# Patient Record
Sex: Female | Born: 1975 | Race: White | Hispanic: No | Marital: Married | Smoking: Current every day smoker
Health system: Southern US, Community
[De-identification: ages and names within clinical notes are randomized; demographics above are authoritative.]

## PROBLEM LIST (undated history)

## (undated) DIAGNOSIS — D689 Coagulation defect, unspecified: Secondary | ICD-10-CM

## (undated) DIAGNOSIS — F319 Bipolar disorder, unspecified: Secondary | ICD-10-CM

## (undated) DIAGNOSIS — F32A Depression, unspecified: Secondary | ICD-10-CM

## (undated) DIAGNOSIS — F419 Anxiety disorder, unspecified: Secondary | ICD-10-CM

## (undated) DIAGNOSIS — B1920 Unspecified viral hepatitis C without hepatic coma: Secondary | ICD-10-CM

## (undated) DIAGNOSIS — E041 Nontoxic single thyroid nodule: Secondary | ICD-10-CM

## (undated) DIAGNOSIS — R9389 Abnormal findings on diagnostic imaging of other specified body structures: Secondary | ICD-10-CM

## (undated) HISTORY — DX: Coagulation defect, unspecified: D68.9

## (undated) HISTORY — DX: Nontoxic single thyroid nodule: E04.1

## (undated) HISTORY — DX: Depression, unspecified: F32.A

## (undated) HISTORY — DX: Abnormal findings on diagnostic imaging of other specified body structures: R93.89

## (undated) HISTORY — DX: Unspecified viral hepatitis C without hepatic coma: B19.20

---

## 2000-02-29 ENCOUNTER — Other Ambulatory Visit: Admission: RE | Admit: 2000-02-29 | Discharge: 2000-02-29 | Payer: Self-pay | Admitting: Obstetrics and Gynecology

## 2000-02-29 ENCOUNTER — Encounter (INDEPENDENT_AMBULATORY_CARE_PROVIDER_SITE_OTHER): Payer: Self-pay

## 2000-04-26 ENCOUNTER — Ambulatory Visit (HOSPITAL_COMMUNITY): Admission: RE | Admit: 2000-04-26 | Discharge: 2000-04-26 | Payer: Self-pay | Admitting: Obstetrics and Gynecology

## 2000-04-26 ENCOUNTER — Encounter (INDEPENDENT_AMBULATORY_CARE_PROVIDER_SITE_OTHER): Payer: Self-pay

## 2005-08-03 ENCOUNTER — Other Ambulatory Visit: Admission: RE | Admit: 2005-08-03 | Discharge: 2005-08-03 | Payer: Self-pay | Admitting: Obstetrics and Gynecology

## 2005-08-28 ENCOUNTER — Emergency Department (HOSPITAL_COMMUNITY): Admission: EM | Admit: 2005-08-28 | Discharge: 2005-08-28 | Payer: Self-pay | Admitting: Emergency Medicine

## 2005-10-11 ENCOUNTER — Ambulatory Visit: Payer: Self-pay | Admitting: Neonatology

## 2005-10-11 ENCOUNTER — Inpatient Hospital Stay (HOSPITAL_COMMUNITY): Admission: AD | Admit: 2005-10-11 | Discharge: 2005-10-15 | Payer: Self-pay | Admitting: Obstetrics and Gynecology

## 2005-10-12 ENCOUNTER — Encounter (INDEPENDENT_AMBULATORY_CARE_PROVIDER_SITE_OTHER): Payer: Self-pay | Admitting: *Deleted

## 2005-11-01 ENCOUNTER — Inpatient Hospital Stay (HOSPITAL_COMMUNITY): Admission: AD | Admit: 2005-11-01 | Discharge: 2005-11-04 | Payer: Self-pay | Admitting: Obstetrics and Gynecology

## 2005-11-12 ENCOUNTER — Inpatient Hospital Stay (HOSPITAL_COMMUNITY): Admission: AD | Admit: 2005-11-12 | Discharge: 2005-11-12 | Payer: Self-pay | Admitting: Obstetrics and Gynecology

## 2005-11-13 ENCOUNTER — Emergency Department (HOSPITAL_COMMUNITY): Admission: EM | Admit: 2005-11-13 | Discharge: 2005-11-13 | Payer: Self-pay | Admitting: Emergency Medicine

## 2005-11-16 ENCOUNTER — Ambulatory Visit: Payer: Self-pay | Admitting: Hospitalist

## 2005-11-22 ENCOUNTER — Encounter (HOSPITAL_COMMUNITY): Admission: RE | Admit: 2005-11-22 | Discharge: 2005-12-28 | Payer: Self-pay | Admitting: Hospitalist

## 2005-11-22 ENCOUNTER — Ambulatory Visit: Payer: Self-pay | Admitting: Internal Medicine

## 2005-11-28 ENCOUNTER — Other Ambulatory Visit: Admission: RE | Admit: 2005-11-28 | Discharge: 2005-11-28 | Payer: Self-pay | Admitting: Obstetrics and Gynecology

## 2005-11-28 ENCOUNTER — Ambulatory Visit: Payer: Self-pay | Admitting: Gastroenterology

## 2005-12-14 ENCOUNTER — Ambulatory Visit: Payer: Self-pay | Admitting: Hospitalist

## 2006-01-09 ENCOUNTER — Ambulatory Visit (HOSPITAL_COMMUNITY): Admission: RE | Admit: 2006-01-09 | Discharge: 2006-01-09 | Payer: Self-pay | Admitting: Gastroenterology

## 2006-01-10 ENCOUNTER — Ambulatory Visit: Payer: Self-pay | Admitting: Internal Medicine

## 2006-01-11 DIAGNOSIS — F319 Bipolar disorder, unspecified: Secondary | ICD-10-CM | POA: Insufficient documentation

## 2006-01-11 DIAGNOSIS — B1710 Acute hepatitis C without hepatic coma: Secondary | ICD-10-CM | POA: Insufficient documentation

## 2006-01-11 DIAGNOSIS — E0500 Thyrotoxicosis with diffuse goiter without thyrotoxic crisis or storm: Secondary | ICD-10-CM

## 2006-01-11 DIAGNOSIS — Z8659 Personal history of other mental and behavioral disorders: Secondary | ICD-10-CM

## 2006-01-11 HISTORY — DX: Thyrotoxicosis with diffuse goiter without thyrotoxic crisis or storm: E05.00

## 2006-02-09 ENCOUNTER — Encounter (INDEPENDENT_AMBULATORY_CARE_PROVIDER_SITE_OTHER): Payer: Self-pay | Admitting: *Deleted

## 2006-02-09 ENCOUNTER — Ambulatory Visit: Payer: Self-pay | Admitting: Internal Medicine

## 2006-02-09 LAB — CONVERTED CEMR LAB
Free T4: 0.83 ng/dL — ABNORMAL LOW (ref 0.89–1.80)
T3, Total: 141.2 ng/dL (ref 60.0–181.0)
TSH: <0.004 microintl units/mL — ABNORMAL LOW (ref 0.350–5.50)

## 2006-02-14 DIAGNOSIS — Z8639 Personal history of other endocrine, nutritional and metabolic disease: Secondary | ICD-10-CM | POA: Insufficient documentation

## 2006-02-14 DIAGNOSIS — I10 Essential (primary) hypertension: Secondary | ICD-10-CM | POA: Insufficient documentation

## 2006-02-14 DIAGNOSIS — E0590 Thyrotoxicosis, unspecified without thyrotoxic crisis or storm: Secondary | ICD-10-CM | POA: Insufficient documentation

## 2006-03-02 ENCOUNTER — Ambulatory Visit: Payer: Self-pay | Admitting: Gastroenterology

## 2006-04-03 ENCOUNTER — Emergency Department (HOSPITAL_COMMUNITY): Admission: EM | Admit: 2006-04-03 | Discharge: 2006-04-03 | Payer: Self-pay | Admitting: Emergency Medicine

## 2006-04-05 ENCOUNTER — Ambulatory Visit: Payer: Self-pay | Admitting: Internal Medicine

## 2006-04-06 ENCOUNTER — Encounter (INDEPENDENT_AMBULATORY_CARE_PROVIDER_SITE_OTHER): Payer: Self-pay | Admitting: *Deleted

## 2006-05-01 ENCOUNTER — Telehealth (INDEPENDENT_AMBULATORY_CARE_PROVIDER_SITE_OTHER): Payer: Self-pay | Admitting: *Deleted

## 2006-05-04 ENCOUNTER — Telehealth (INDEPENDENT_AMBULATORY_CARE_PROVIDER_SITE_OTHER): Payer: Self-pay | Admitting: Hospitalist

## 2006-05-10 ENCOUNTER — Encounter (INDEPENDENT_AMBULATORY_CARE_PROVIDER_SITE_OTHER): Payer: Self-pay | Admitting: *Deleted

## 2006-06-07 ENCOUNTER — Ambulatory Visit: Payer: Self-pay | Admitting: Gastroenterology

## 2006-06-20 ENCOUNTER — Telehealth (INDEPENDENT_AMBULATORY_CARE_PROVIDER_SITE_OTHER): Payer: Self-pay | Admitting: Pharmacy Technician

## 2006-07-13 ENCOUNTER — Ambulatory Visit (HOSPITAL_COMMUNITY): Admission: RE | Admit: 2006-07-13 | Discharge: 2006-07-13 | Payer: Self-pay | Admitting: Gastroenterology

## 2006-07-13 ENCOUNTER — Encounter (INDEPENDENT_AMBULATORY_CARE_PROVIDER_SITE_OTHER): Payer: Self-pay | Admitting: *Deleted

## 2006-09-06 ENCOUNTER — Ambulatory Visit: Payer: Self-pay | Admitting: Gastroenterology

## 2007-02-28 ENCOUNTER — Ambulatory Visit: Payer: Self-pay | Admitting: Gastroenterology

## 2007-04-04 ENCOUNTER — Ambulatory Visit: Payer: Self-pay | Admitting: Gastroenterology

## 2008-05-11 ENCOUNTER — Inpatient Hospital Stay (HOSPITAL_COMMUNITY): Admission: RE | Admit: 2008-05-11 | Discharge: 2008-05-13 | Payer: Self-pay | Admitting: Obstetrics & Gynecology

## 2008-05-11 ENCOUNTER — Encounter (INDEPENDENT_AMBULATORY_CARE_PROVIDER_SITE_OTHER): Payer: Self-pay | Admitting: Obstetrics & Gynecology

## 2010-05-08 ENCOUNTER — Encounter: Payer: Self-pay | Admitting: Gastroenterology

## 2010-08-01 LAB — CBC
HCT: 28.3 % — ABNORMAL LOW (ref 36.0–46.0)
HCT: 34.8 % — ABNORMAL LOW (ref 36.0–46.0)
Hemoglobin: 10.1 g/dL — ABNORMAL LOW (ref 12.0–15.0)
Hemoglobin: 12.1 g/dL (ref 12.0–15.0)
MCHC: 35 g/dL (ref 30.0–36.0)
MCHC: 35.6 g/dL (ref 30.0–36.0)
MCV: 98.5 fL (ref 78.0–100.0)
MCV: 99 fL (ref 78.0–100.0)
Platelets: 231 10*3/uL (ref 150–400)
RDW: 13.4 % (ref 11.5–15.5)
WBC: 11.7 10*3/uL — ABNORMAL HIGH (ref 4.0–10.5)
WBC: 11.9 10*3/uL — ABNORMAL HIGH (ref 4.0–10.5)

## 2010-08-30 NOTE — Op Note (Signed)
NAMEMarland Kitchen  [name], [name]             ACCOUNT NO.:  [id number]   MEDICAL RECORD NO.:  [id number]          PATIENT TYPE:  INP   LOCATION:  9105                          [facility]   PHYSICIAN:  Ilda Mori, M.D.   DATE OF BIRTH:  12/05/1975   DATE OF PROCEDURE:  DATE OF DISCHARGE:                               OPERATIVE REPORT   PREOPERATIVE DIAGNOSES:  Previous cesarean section, voluntary  sterilization, term pregnancy.   POSTOPERATIVE DIAGNOSES:  Previous cesarean section, voluntary  sterilization, term pregnancy.   PROCEDURE:  Repeat low transverse cesarean section, bilateral partial  salpingectomy for sterilization.   SURGEON:  Ilda Mori, MD   ASSISTANT:  Malva Limes, MD   ANESTHESIA:  Spinal.   BLOOD LOSS:  800 mL.   FINDINGS:  Female infant, Apgar 8 and 9, birth weight 9 pounds 1 ounce.  Clear amniotic fluid.  Normal-appearing tubes, ovaries, and uterus.   INDICATIONS:  This is a 35 year old, gravida 2, para 1, female who is at  49 weeks' gestation whose previous baby was delivered at 28 weeks by a  low vertical uterine incision.  The patient requests repeat cesarean  section and also requests permanent sterilization.  The alternative  forms of nonpermanent sterilization and the risk of failure of two per  thousand was discussed with the patient prior to proceeding.   PROCEDURE:  The patient was taken to the operating room and spinal  anesthesia was placed.  She was then placed in the supine position with  left lateral tilt.  The abdomen was prepped and draped in sterile  fashion, and the bladder was catheterized.  A low transverse incision  was made through the previous laparotomy scar and carried down to the  fascia which was then extended transversely.  The rectus sheath was then  dissected free from the underlying rectus muscle superiorly.  The rectus  midline was then entered and extended bluntly.  The peritoneum was  entered at this time as well.  A  lower segment was identified.  Incision  was made and carried down to the endometrial cavity.  The amniotic fluid  was clear.  The infant was delivered without difficulty.  Cord bloods  were obtained.  The placenta was delivered with traction on the cord,  and the endometrial cavity was then bluntly curettaged with a sponge.  The lower segment was then closed with single layer of running  interlocking #1 Vicryl suture.  Hemostasis was noted to be present.  The  fallopian tubes were identified bilaterally, grasped at the isthmic  ampullary junction, and a knuckle of tube was doubly ligated with plain  0 catgut suture.  Portions of both tubes were sent for pathological  confirmation.  The rectus muscle and peritoneum were then closed in the  midline with running 3-0 Vicryl suture, the  fascia was closed with running 0 Vicryl suture, the subcutaneous tissue,  which was approximately 4 cm in depth, was closed with interrupted 2-0  plain suture, and the skin was closed with staples.  The patient  tolerated the procedure well and left the operating room in good  condition.      Ilda Mori, M.D.  Electronically Signed     RK/MEDQ  D:  05/11/2008  T:  05/11/2008  Job:  [id number]

## 2010-08-30 NOTE — Discharge Summary (Signed)
NAMEGABRYEL, [name]             ACCOUNT NO.:  [id number]   MEDICAL RECORD NO.:  [id number]          PATIENT TYPE:  INP   LOCATION:  9105                          [facility]   PHYSICIAN:  Gerrit Friends. Aldona Bar, M.D.   DATE OF BIRTH:  11-Jul-1975   DATE OF ADMISSION:  05/11/2008  DATE OF DISCHARGE:  05/13/2008                               DISCHARGE SUMMARY   DISCHARGE DIAGNOSES:  1. Term pregnancy delivered 9 pounds 1 ounce female infant, Apgars 8      and 9.  2. Blood type B positive.  3. Previous cesarean section.  4. Desire for permanent elective sterilization.   PROCEDURES:  Repeat cesarean section and bilateral tubal sterilization  procedure.   SUMMARY:  This 35 year old gravida 2, para 1 was admitted at term for  repeat cesarean section and tubal sterilization.  She was taken to the  operating room by Dr. Arlyce Dice on January 25 at which time she underwent a  repeat cesarean section with delivery of a 9 pounds 1 ounce female  infant with good Apgars at that time as well had a permanent elective  sterilization procedure.  Her postpartum course was totally benign.  Her  discharge hemoglobin was 10.1 with a white count of 11,700 and a  platelet count of 195,000.  On the morning of January 27, she was  desirous of discharge.  She was ambulating well, tolerating a regular  diet well, voiding without difficulty, passing gas without difficulty.  Her pain medication was very adequate.  The wound was clean and dry.  Her incision was healing nicely, and she was discharged home.   DISCHARGE MEDICATIONS:  Tylox 1-2 every 4-6 hours as needed for severe  pain, Motrin 600 mg every 6 hours as needed for less severe pain, and  she will continue on her Lamictal 200 mg at bedtime, and Risperdal 1 mg  at bedtime, which she took during her antenatal course.  She will finish  up her vitamins and she will add Feosol capsules - 1 daily until her  postpartum visit in approximately 4 weeks' time.   She will return to the office in 48 hours to have her staples removed  and wound Steri-Stripped.   CONDITION ON DISCHARGE:  Improved.      Gerrit Friends. Aldona Bar, M.D.  Electronically Signed     RMW/MEDQ  D:  05/13/2008  T:  05/13/2008  Job:  [id number]

## 2010-09-02 NOTE — Consult Note (Signed)
NAMEMAZEY, MANTELL             ACCOUNT NO.:  [id number]   MEDICAL RECORD NO.:  [id number]          PATIENT TYPE:  INP   LOCATION:  9305                          [facility]   PHYSICIAN:  Rudy Jew. Ashley Royalty, M.D.DATE OF BIRTH:  1976-02-01   DATE OF CONSULTATION:  10/12/2005  DATE OF DISCHARGE:  10/15/2005                                   CONSULTATION   ADDENDUM TO OPERATIVE NOTE DATED 10/12/2005:  After I was notified by  medical records that the dictation ended abruptly in the middle of the  previous dictation of 10/12/2005.   Again, the dictation after the sentence, The infant was suctioned.  The  infant was then given immediately to the waiting pediatrics team.  Arterial  cord pH was obtained, as well as regular and cord blood.  The placenta and  membranes were then removed in their entirety.  The uterus was then  exteriorized.  The uterus was then closed in three layers.  The first was a  running locking layer of 0-Vicryl.  The second was an interrupted layer of  figure-of-eight sutures of 0-Vicryl.  The third was a serosal layer of PDS  placed as a baseball stitch.  Hemostasis was noted.  Uterus, tubes and  ovaries were inspected and found to be normal.  They were returned to the  abdominal cavity.  Copious irrigation was accomplished.  Hemostasis was  noted.  The peritoneum was then closed with 3-0 Vicryl in a running fashion.  The deep fascia was closed with 0-Vicryl in an interrupted fashion.  The  skin was closed with staples.  The patient tolerated the procedure extremely  well and was returned to the recovery room in good condition.      James A. Ashley Royalty, M.D.  Electronically Signed     JAM/MEDQ  D:  11/08/2005  T:  11/08/2005  Job:  [id number]

## 2010-09-02 NOTE — Discharge Summary (Signed)
[name], TAT             ACCOUNT NO.:  [id number]   MEDICAL RECORD NO.:  [id number]          PATIENT TYPE:  INP   LOCATION:  9305                          [facility]   PHYSICIAN:  Rudy Jew. Ashley Royalty, M.D.DATE OF BIRTH:  Aug 29, 1975   DATE OF ADMISSION:  10/11/2005  DATE OF DISCHARGE:  10/15/2005                                 DISCHARGE SUMMARY   DISCHARGE DIAGNOSES:  1.  Intrauterine pregnancy at 27 weeks, one day, delivered.  2.  Bipolar disorder.  3.  Severe preeclampsia.  4.  History of drug use during current pregnancy.  5.  Tobacco use.   SPECIAL PROCEDURES:  Primary low vertical caesarian section.   CONSULTATIONS:  1.  Dr. Jeanie Sewer -- Psychiatry.  2.  Dr. Alison Murray -- Neonatology.   DISCHARGE MEDICATIONS:  1.  Depakote 250 mg t.i.d.  2.  Wellbutrin SR 150 mg daily.  3.  Tandem 1 p.o. daily.  4.  Percocet 5/325 #40,  5.  Procardia XL 30 mg.   HISTORY AND PHYSICAL:  This is a 35 year old primigravida at 42 week one day  gestation.  Prenatal care complicated by the above mentioned risk factors.  The patient was noted to have a blood pressure of 150/90 at her first visit  to my office August 03, 2005.  She was started on AdoMet 500 mg p.o. b.i.d.  in late April for possible chronic hypertension.  24-hour urine was obtained  June 1 and revealed 1490 mg protein per 24 hours.   The patient presented to the office October 10, 2005 at which time the blood  pressure was noted to be 140/70.  She denied visual disturbances, headache,  epigastric or right upper quadrant pain.  PIH panel was obtained and the  SGOT was noted to be 50, with SGPT of 75.  Platelet count was 194,000.  Other values were essentially within normal limits.  DTRs were 0 to 1+.  The  cervix was closed.  PIH panel was repeated in turn at admissions and SGOT  was 48, SGPT 72, platelet count 189,000.  The patient was hence admitted  with a diagnosis of prior hypertension with possible superimposed  severe  preeclampsia.   HOSPITAL COURSE:  The patient was admitted to [facility].  Admission  laboratories were drawn.  Magnesium sulfate was initiated.  Betamethasone  was administered in anticipation of likely pre-term delivery in the near  future.  In view of the fact that the patient demonstrated significant  proteinuria with elevated blood pressures as well as elevated liver enzymes,  the diagnosis of severe preeclampsia was made and decision was made to  proceed with primary caesarian section.  The patient was taken to the  operating room on October 12, 2005 for same.  The primary low vertical  caesarian section was performed by Dr. Ashley Royalty with Dr. Sydnee Cabal  assisting.  The infant  was a 1 pound 13 oz female, Apgars 4 at one minute, 8 at five minutes into  neonatal intensive care unit.  Arterial cord pH was 7.21.   DICTATION ENDED HERE      James A.  Ashley Royalty, M.D.  Electronically Signed     JAM/MEDQ  D:  11/08/2005  T:  11/08/2005  Job:  [id number]

## 2010-09-02 NOTE — Discharge Summary (Signed)
[name], [name]             ACCOUNT NO.:  [id number]   MEDICAL RECORD NO.:  [id number]          PATIENT TYPE:  INP   LOCATION:  9372                          [facility]   PHYSICIAN:  Rudy Jew. Ashley Royalty, M.D.DATE OF BIRTH:  February 09, 1976   DATE OF ADMISSION:  10/11/2005  DATE OF DISCHARGE:  10/15/2005                                 DISCHARGE SUMMARY   This is to replace the first dictation.   DISCHARGE DIAGNOSES:  1.  Intrauterine pregnancy at 27 weeks 1day gestation.  2.  Bipolar disorder.  3.  Chronic hypertension with superimposed severe preeclampsia.  4.  History of drug use during late pregnancy.  5.  Tobacco use.  6.  Preterm birth living child.   OPERATION AND SPECIAL PROCEDURES:  Primary low vertical cesarean section.   CONSULTATIONS:  Dr. Bea Graff;  Dr. Ransom--neonatology.   DISCHARGE MEDICATIONS:  1.  Depakote 250 t.i.d.  2.  Wellbutrin SR 150 daily tandem, #30.  3.  Percocet 5/325 mg, #40.  4.  Procardia XL 30 mg.   HISTORY AND PHYSICAL:  This is a 35 year old primigravida at 58 weeks 1 day  gestation on admission with the aforementioned diagnoses.  The patient was  noted at her first visit on August 03, 2005, to have a blood pressure of  150/90.  She was started on Aldomet 500 mg p.o. b.i.d.  A 24-hour urine was  obtained on September 15, 2005, which revealed 1490 mg of protein for 24 hours.  The patient presented to the office on October 10, 2005, for routine  evaluation.  At the time blood pressure was noted to be 140/70.  She denied  any symptoms of severe preeclampsia at that time.  PIH panel was obtained  and SGOT was noted to be 50 and SGPT noted to be 75.  The patient was asked  to come in the hospital upon receipt of the laboratory results.   On examination initial blood pressure was 133/87.  DTRs were 0 to 1+.  The  cervix was noted to be closed, long, and thick.  Repeat SGOT  in the MAU  revealed a value of 48 with an SGPT of 72.   HOSPITAL COURSE:  The patient was admitted to [facility]  [place].  She was given magnesium sulfate.  She was also given  betamethasone in anticipation of preterm delivery in the near future.  In  view of the hypertension with proteinuria as well as the liver enzyme  elevation, it was felt in the patient's best interest to proceed with  delivery for diagnosis of superimposed preeclampsia--severe.  Hence, the  patient was seen in the operating room on November 11, 2005, and underwent  primary low vertical cesarean section by Dr. Sylvester Harder.  Dr. Loreli Dollar assisted.  The infant was a 1 pound 13 ounce female with Apgars of  4 at 1 minute and 8 at 5 minutes, and sent to the neonatal intensive care  unit.  Arterial cord pH was 7.21.  Postoperatively, the patient was  maintained on the intensive care unit on magnesium sulfate.  Dr.  Williford  from psychiatry was consulted in order to assist with her bipolar  management.  She was begun on Depakote and Wellbutrin.  She later was placed  on Procardia by Dr. Sydnee Cabal.  By October 15, 2005, the patient was felt to  have improved sufficiently such that she was a candidate for discharge home.  She was, hence, discharged home on that day afebrile and in satisfactory  condition.   DISPOSITION:  The patient is to return to [facility] within 24 hours to remainder of her staples out and also to check  her blood pressure.      James A. Ashley Royalty, M.D.  Electronically Signed     JAM/MEDQ  D:  11/08/2005  T:  11/08/2005  Job:  [id number]
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
## 2010-09-02 NOTE — Op Note (Signed)
[facility]  Patient:    [name], [name]                    MRN: [id number] Proc. Date: 04/26/00 Adm. 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  OPERATION:                    Cold knife conization.  SURGEON:                      Katherine Roan, M.D.  DESCRIPTION OF PROCEDURE:     The patient was placed in the lithotomy position, prepped and draped in the usual fashion.  The cervix was quite small.  Infiltrated with 1% Xylocaine with epinephrine.  Two lateral hemostatic sutures were placed in the cervix, and a very careful, small conization was obtained with a deep endocervical bud.  The cervix was then closed with two sutures of 0 Vicryl.  Hemostasis was secure.  [name] tolerated this procedure well and was sent to the recovery room in good condition. DD:  04/26/00 TD:  04/27/00 Job: [id number]
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
## 2010-09-02 NOTE — Op Note (Signed)
NAMEPRESLI, FANGUY             ACCOUNT NO.:  [id number]   MEDICAL RECORD NO.:  [id number]          PATIENT TYPE:  INP   LOCATION:  9156                          [facility]  [facility]   PHYSICIAN:  Rudy Jew. Ashley Royalty, M.D.DATE OF BIRTH:  02/07/1976   DATE OF PROCEDURE:  10/12/2005  DATE OF DISCHARGE:                                 OPERATIVE REPORT   PREOPERATIVE DIAGNOSES:  1.  Intrauterine pregnancy at 27 weeks 2 days gestation.  2.  Preeclampsia, severe.   POSTOPERATIVE DIAGNOSES:  1.  Intrauterine pregnancy at 27 weeks 2 days gestation.  2.  Preeclampsia, severe.   PROCEDURE:  Primary low vertical cesarean section.   SURGEON:  Rudy Jew. Ashley Royalty, M.D.   ASSISTANT:  Loreli Dollar, M.D.   ANESTHESIA:  Spinal.   FINDINGS:  A 1 pound 13 ounce female.  Apgars 4 at one minute, 8 at five  minutes.  Sent to the neonatal intensive care unit.  Arterial cord pH 7.21.   ESTIMATED BLOOD LOSS:  500 cc.   COMPLICATIONS:  None.   PACKS AND DRAINS:  Foley.   Sponge, needle and instrument count reported as correct times two.   PROCEDURE:  The patient was taken to the operating room and placed in the  sitting position.  After spinal anesthetic was administered, she was placed  in the dorsal supine position and prepped and draped in the usual manner for  abdominal surgery.  Foley catheter was placed.  A Pfannenstiel incision was  made and the subcutaneous tissues were sharply and bluntly dissected down to  the fascia.  The fascia was nicked with a knife and incised transversely  with Mayo scissors.  The underlying rectus muscles were separated from the  overlying fascia using sharp and blunt dissection.  The rectus muscles were  separated in the midline exposing the peritoneum, which was elevated with  hemostats and entered atraumatically with Metzenbaum scissors.  The incision  was extended longitudinally.  The uterus was identified and a bladder flap  created by incising the anterior  uterine serosa and sharply and bluntly  dissecting the bladder inferiorly.  It was held in place with the bladder  blade.  The uterus was surveyed and the width was deemed insufficient for  low transverse incision; hence, a low vertical incision was  performed.  The fluid was noted to be slightly blood stained.  There was no  frank meconium.  The infant was delivered from a footling breech  presentation in an atraumatic manner.  The infant was suctioned.   Dictation ended at this point.      James A. Ashley Royalty, M.D.  Electronically Signed     JAM/MEDQ  D:  10/12/2005  T:  10/12/2005  Job:  [id number]

## 2010-09-02 NOTE — Discharge Summary (Signed)
[name], [name]             ACCOUNT NO.:  [id number]   MEDICAL RECORD NO.:  [id number]          PATIENT TYPE:  INP   LOCATION:  9372                          [facility]   PHYSICIAN:  Gerald Leitz, MD          DATE OF BIRTH:  [birth year]   DATE OF ADMISSION:  11/01/2005  DATE OF DISCHARGE:                                 DISCHARGE SUMMARY   ADMISSION DIAGNOSES:  1. Elevated liver enzymes.  2. Suspected recurrent severe preeclampsia vs depakote induced hepatitis.   DISCHARGE DIAGNOSIS:  Depakote-induced hepatitis.   BRIEF HOSPITAL COURSE:  The patient was admitted on November 01, 2005 by Dr.  Ashley Royalty for elevated liver enzymes and suspected recurrent preeclampsia.  She presented to his office complaining of severe headache, was found to  have LFTs of SGPT of 440s and SGOT in the 200s.  The patient was admitted  and received magnesium sulfate for seizure prophylaxis.  She was also seen  by Dr. Jeanie Sewer for evaluation given her history of bipolar disorder.  She  had been placed on Depakote in June, at which time she had a cesarean  section and severe preeclampsia during that admission.  She was placed on  Depakote during that admission.  He evaluated her and suspected that the  liver enzymes were elevated secondary to Depakote-induced hepatitis.  He  discontinued the Depakote.  The liver enzymes have declined during her stay.  Upon discharge, her SGOT is 130, SGPT is 319.  On July 21, she denied any  headache, visual disturbances, or right upper quadrant pain.   DISCHARGE INSTRUCTIONS:  The patient was advised to seek medical attention  or return if she develops a severe headache, visual disturbances, or right  upper quadrant pain.   FOLLOWUP:  The patient will follow up with Dr. Ashley Royalty on July 23 or 24 for  reevaluation of her liver enzymes.  She is also to report to [facility]  on Wednesday for evaluation of her lithium level.   DISCHARGE MEDICATIONS:  1. Lithium 300  mg  1 p.o. t.i.d.  2. Wellbutrin as previously ordered by Dr. Jeanie Sewer.   DISPOSITION:  She is discharged home in stable condition.  Diet will be  regular.  Follow up with Dr. Ashley Royalty as stated above.  He will arrange  followup with [facility] for reevaluation and management of her  bipolar disorder.      Gerald Leitz, MD  Electronically Signed     TC/MEDQ  D:  11/04/2005  T:  11/04/2005  Job:  [id number]

## 2010-09-02 NOTE — H&P (Signed)
[name], [name]             ACCOUNT NO.:  [id number]   MEDICAL RECORD NO.:  [id number]          PATIENT TYPE:  INP   LOCATION:  9372                          [facility]   PHYSICIAN:  Rudy Jew. Ashley Royalty, M.D.DATE OF BIRTH:  Apr 01, 1976   DATE OF ADMISSION:  11/01/2005  DATE OF DISCHARGE:                                HISTORY & PHYSICAL   HISTORY OF PRESENT ILLNESS:  This is a 35 year old, G1, P1 who is status  post cesarean section on October 12, 2005, at 27-1/2 weeks' gestation for  severe preeclampsia.  The patient also has Bipolar disorder.  She is a  smoker.  During her most recent hospitalization, Dr. Jeanie Sewer from  psychiatry was consulted and the patient was started on Wellbutrin and  Depakote.  She was discharged home several days after delivery for close  followup.  The patient presented to the office on October 31, 2005,  approximately 19 days after delivery complaining of a headache.  She denies  any visual disturbances, right upper quadrant or epigastric pain.  Blood  pressure was 120/80.  PIH panel was obtained subsequently and the SGOT was  noted to be 250 units/L and the SGPT 450 3 units/L.  The patient is hence  admitted for evaluation and management including magnesium sulfate therapy.   MEDICATIONS:  1.  Depakote, per Dr. Jeanie Sewer.  2.  Wellbutrin, per Dr. Jeanie Sewer.  3.  Motrin 600 mg four times a day p.r.n. pain.   PAST MEDICAL HISTORY:  As above.   PAST SURGICAL HISTORY:  As above, plus unknown procedure for cervical  dysplasia in the year 2000.   REVIEW OF SYSTEMS:  Noncontributory.   PHYSICAL EXAMINATION:  GENERAL:  Well-developed, well-nourished, pleasant,  white female in no acute distress.  VITAL SIGNS:  Afebrile, vital signs stable.  CHEST:  Lungs are clear.  CARDIAC:  Regular rate and rhythm.  ABDOMEN:  Soft and nontender.  Incision is healing well.  MUSCULOSKELETAL:  No CVA tenderness.  No significant edema.  DTRs are 2+ and  equal.  PELVIC:  Deferred.   IMPRESSION:  1.  Status post primary cesarean section on October 12, 2005, at about 27-1/2      weeks' gestation for severe preeclampsia.  2.  Bipolar disorder.  3.  Smoker.  4.  Headache with liver enzyme elevation, apparently recurrence of      preeclampsia.   PLAN:  Admit.  Will administer IV magnesium sulfate.  Dr. Jeanie Sewer has been  consulted.  I discussed the patient's care with him briefly and he suggested  in the interim period that the Depakote be discontinued.  This has been  ordered.  Will repeat labs and follow patient closely.      James A. Ashley Royalty, M.D.  Electronically Signed     JAM/MEDQ  D:  11/02/2005  T:  11/02/2005  Job:  [id number]

## 2013-12-27 ENCOUNTER — Encounter (HOSPITAL_COMMUNITY): Payer: Self-pay | Admitting: Emergency Medicine

## 2013-12-27 ENCOUNTER — Emergency Department (HOSPITAL_COMMUNITY): Payer: BC Managed Care – PPO

## 2013-12-27 ENCOUNTER — Emergency Department (HOSPITAL_COMMUNITY)
Admission: EM | Admit: 2013-12-27 | Discharge: 2013-12-27 | Disposition: A | Discharge status: Home / Self Care | Payer: BC Managed Care – PPO | Attending: Emergency Medicine | Admitting: Emergency Medicine

## 2013-12-27 DIAGNOSIS — H5710 Ocular pain, unspecified eye: Secondary | ICD-10-CM | POA: Diagnosis present

## 2013-12-27 DIAGNOSIS — Z87891 Personal history of nicotine dependence: Secondary | ICD-10-CM | POA: Insufficient documentation

## 2013-12-27 DIAGNOSIS — H547 Unspecified visual loss: Secondary | ICD-10-CM | POA: Diagnosis not present

## 2013-12-27 DIAGNOSIS — H5340 Unspecified visual field defects: Secondary | ICD-10-CM

## 2013-12-27 LAB — CBC WITH DIFFERENTIAL/PLATELET
BASOS ABS: 0 10*3/uL (ref 0.0–0.1)
Basophils Relative: 0 % (ref 0–1)
EOS PCT: 1 % (ref 0–5)
Eosinophils Absolute: 0.1 10*3/uL (ref 0.0–0.7)
HEMATOCRIT: 39.8 % (ref 36.0–46.0)
HEMOGLOBIN: 14.5 g/dL (ref 12.0–15.0)
LYMPHS PCT: 28 % (ref 12–46)
Lymphs Abs: 2.1 10*3/uL (ref 0.7–4.0)
MCH: 31.8 pg (ref 26.0–34.0)
MCHC: 36.4 g/dL — AB (ref 30.0–36.0)
MCV: 87.3 fL (ref 78.0–100.0)
Monocytes Absolute: 0.6 10*3/uL (ref 0.1–1.0)
Monocytes Relative: 8 % (ref 3–12)
NEUTROS ABS: 4.8 10*3/uL (ref 1.7–7.7)
Neutrophils Relative %: 63 % (ref 43–77)
Platelets: 188 10*3/uL (ref 150–400)
RBC: 4.56 MIL/uL (ref 3.87–5.11)
RDW: 12.5 % (ref 11.5–15.5)
WBC: 7.7 10*3/uL (ref 4.0–10.5)

## 2013-12-27 LAB — BASIC METABOLIC PANEL
ANION GAP: 12 (ref 5–15)
BUN: 9 mg/dL (ref 6–23)
CHLORIDE: 105 meq/L (ref 96–112)
CO2: 22 meq/L (ref 19–32)
CREATININE: 0.89 mg/dL (ref 0.50–1.10)
Calcium: 10.4 mg/dL (ref 8.4–10.5)
GFR calc Af Amer: >90 mL/min (ref >90)
GFR, EST NON AFRICAN AMERICAN: 81 mL/min — AB (ref >90)
GLUCOSE: 86 mg/dL (ref 70–99)
Potassium: 4.2 mEq/L (ref 3.7–5.3)
SODIUM: 139 meq/L (ref 137–147)

## 2013-12-27 MED ORDER — GADOBENATE DIMEGLUMINE 529 MG/ML IV SOLN
20.0000 mL | Freq: Once | INTRAVENOUS | Status: AC | PRN
  Administered 2013-12-27: 20 mL via INTRAVENOUS

## 2013-12-27 NOTE — ED Notes (Signed)
Patient transported to MRI 

## 2013-12-27 NOTE — ED Provider Notes (Signed)
CSN: [id number]     Arrival date & time 12/27/13  1606 History   First MD Initiated Contact with Patient 12/27/13 1748     Chief Complaint  Patient presents with  . Eye Pain     (Consider location/radiation/quality/duration/timing/severity/associated sxs/prior Treatment) HPI  Patient presents to the emergency department with complaints of right eye visual fields changes. She was referred here from her ophthalmologist Dr. Wynetta Emery. Dr. Wynetta Emery did a thorough eye exam in her office and recommended she come to the ER for an MRI of her brain. The patient denies having any weakness, confusion, aphasia, injury to the face or head. Denies that this has happened before. She reports that she is otherwise healthy and has never had this problem before. Denies headaches, fevers, CP, hx of stroke.  History reviewed. No pertinent past medical history. Past Surgical History  Procedure Laterality Date  . Cesarean section  x 2   No family history on file. History  Substance Use Topics  . Smoking status: Former Research scientist (life sciences)  . Smokeless tobacco: Not on file     Comment: vapor  . Alcohol Use: No   OB History   Grav Para Term Preterm Abortions TAB SAB Ect Mult Living                 Review of Systems  Review of Systems  Gen: no weight loss, fevers, chills, night sweats  Eyes: no occular draining, occular pain,  + visual changes to right eye Nose: no epistaxis or rhinorrhea  Mouth: no dental pain, no sore throat  Neck: no neck pain  Lungs: No hemoptysis. No wheezing or coughing CV:  No palpitations, dependent edema or orthopnea. No chest pain Abd: no diarrhea. No nausea or vomiting, No abdominal pain  GU: no dysuria or gross hematuria  MSK:  No muscle weakness, No muscular pain Neuro: no headache, no focal neurologic deficits  Skin: no rash , no wounds Psyche: no complaints of depression or anxiety     Allergies  Review of patient's allergies indicates no known allergies.  Home Medications    Prior to Admission medications   Not on File   BP 143/68  Pulse 93  Temp(Src) 97.9 F (36.6 C) (Oral)  Resp 18  Ht 5' 5.5" (1.664 m)  Wt 207 lb (93.895 kg)  BMI 33.91 kg/m2  SpO2 99%  LMP 12/04/2013 Physical Exam  Nursing note and vitals reviewed. Constitutional: She appears well-developed and well-nourished. No distress.  HENT:  Head: Normocephalic and atraumatic.  Eyes: Conjunctivae, EOM and lids are normal. Pupils are equal, round, and reactive to light.  Reports abnormal appreciation of color. Able to recognize blue, red and yellow upon questioning.  Neck: Normal range of motion. Neck supple.  Cardiovascular: Normal rate and regular rhythm.   Pulmonary/Chest: Effort normal.  Abdominal: Soft.  Neurological: She is alert.  Skin: Skin is warm and dry.    ED Course  Procedures (including critical care time) Labs Review Labs Reviewed  CBC WITH DIFFERENTIAL - Abnormal; Notable for the following:    MCHC 36.4 (*)    All other components within normal limits  BASIC METABOLIC PANEL - Abnormal; Notable for the following:    GFR calc non Af Amer 81 (*)    All other components within normal limits  POC URINE PREG, ED    Imaging Review Mr Jeri Cos Wo Contrast  12/27/2013   CLINICAL DATA:  Right visual field deficit  EXAM: MRI HEAD WITHOUT AND WITH CONTRAST  TECHNIQUE: Multiplanar, multiecho pulse sequences of the brain and surrounding structures were obtained without and with intravenous contrast.  CONTRAST:  40mL MULTIHANCE GADOBENATE DIMEGLUMINE 529 MG/ML IV SOLN  COMPARISON:  None.  FINDINGS: Negative for acute infarct.  No significant chronic ischemia  Ventricle size is normal. Craniocervical junction is normal. Pituitary normal in size  Negative for intracranial hemorrhage.  Negative for mass or edema.  Postcontrast imaging reveals normal enhancement.  No mass lesion.  Paranasal sinuses reveal mild mucosal edema in the left maxillary sinus.  IMPRESSION: Normal MRI of the  brain with contrast.   Electronically Signed   By: Franchot Gallo M.D.   On: 12/27/2013 20:23     EKG Interpretation None      MDM   Final diagnoses:  Visual field defect   Patient has had no new symptoms since yesterday to meet criteria for code stroke. No other complaints aside from change in vision. Dr. Wynetta Emery has request MRI of brain to rule out intraorbtial and intracranial abnormality.  Discussed with Dr. Colin Rhein, he is okay with obtaining MRI. Recommends talking to radiologists to confirm appropriate image. Radiology recommends MRI/MRA.   9:00pm Patient has normal MRI/MRA. No gross abnormalities of vision that would prevent patient from returning home. She has no pain and no new symptoms since seen by Ophthalmology. Pt can follow-up with Ophthalmology as outpatient.  38 y.o.[name]'s evaluation in the Emergency Department is complete. It has been determined that no acute conditions requiring further emergency intervention are present at this time. The patient/guardian have been advised of the diagnosis and plan. We have discussed signs and symptoms that warrant return to the ED, such as changes or worsening in symptoms.  Vital signs are stable at discharge. Filed Vitals:   12/27/13 2118  BP: 143/68  Pulse: 93  Temp:   Resp:     Patient/guardian has voiced understanding and agreed to follow-up with the PCP or specialist.     Linus Mako, PA-C 12/27/13 2136

## 2013-12-27 NOTE — Discharge Instructions (Signed)
Visual Field Defect °The visual fields are the areas that are seen by each eye. The nerves that receive visual signals follow a complex path. These nerves go from the back of the eye to the very back of the brain (occipital lobe), which is the center for vision. °The nerves follow a complicated path on their way from the eyes to the brain and anything affecting the eyes themselves, the optic nerves, the crossover point (optic chiasm), the optic tracks, or the back of the brain can cause a part of the visual fields to disappear. This is called a visual field defect. °By carefully testing the visual fields for areas of vision that are missing, it is often possible to tell where the problem lies along this complicated pathway.  °CAUSES  °There are a great many diseases and disorders that can cause visual field defects because any problem at any point along the complicated path to the back of the brain can be at fault. There are too many causes of visual defects to list, but here are some just some of the types of problems that cause visual field defects: °Diseases of the Eyes °· Glaucoma. °· Diseases or defect of: °¨ The cornea. °¨ The lens (cataract). °¨ The fluid inside the eye (the vitreous). °· These visual defects usually "float" or seem to move. °¨ The retina. °¨ The optic nerve. °Diseases of the Optic Chiasm °· Tumors of, or near the optic chiasm. °· Tumors and diseases of the pituitary gland. °· Hemorrhages, aneurysms, or other disorders of the blood vessels near the optic chiasm. °Diseases of the Optic Tracks °· Hemorrhages (stroke), aneurysms, or other disorders of the blood vessels within the brain. °· Brain tumor. °· Neurological diseases of the brain (e.g. multiple sclerosis). °· Increased pressure inside the head. °Diseases of the Brain °· Disorders of development °¨ Amblyopia (poor vision in one eye due to underdevelopment of the brain's visual center in childhood). °· Hemorrhages (stroke), aneurysms, or  other disorders of the blood vessels within the brain. °· Brain tumor. °· Brian infections. °· Neurological diseases of the brain (e.g. multiple sclerosis). °· Increased pressure inside the head. °· Drugs (e.g. digitalis poisoning). °· Poisoning (e.g. lead poisoning, methanol poisoning). °Diseases of the body or other organs (e.g. pernicious anemia, drug toxicity). °SYMPTOMS  °It is important for the doctor to know if visual field defects are present in both eyes or just one eye. The defect can be subtle. Often a patient does not know that a small portion of vision is missing. When a visual defect is present in just one eye, the disease or problem is almost always in the eye or the optic nerve on that side. If a defect is found to be present in both eyes, it is important that testing is carried out. This is because there may be a problem within the brain behind the point where the optic nerves meet and cross (optic chiasm). °DIAGNOSIS  °Visual field testing is done in the doctors office and is easy and painless. With one eye patched, the patient is asked to look at the center of a screen or large bowl shaped machine. The patient is told to press a button whenever he or she sees a small light with their side vision. By doing this, the visual field in each eye is "mapped" looking for areas of lost or diminished vision. Everyone has a normal "blind spot" where the optic nerve leaves the eye, but even the size and shape   of the blind spot is mapped to look for abnormalities. °TREATMENT  °Treatment depends on the cause of the visual defect(s). If the problem is not coming from the eye(s), more testing may be needed to find the cause of the problem. Additional testing includes: °· X-rays of the head. °· CT scanning. °· injecting a dye into the bloodstream with X-rays of the head (angiography). °· Other investigations. °Document Released: 01/11/2008 Document Revised: 06/26/2011 Document Reviewed: 01/11/2008 °[employer]®  Patient Information ©2015 [employer]. This information is not intended to replace advice given to you by your health care provider. Make sure you discuss any questions you have with your health care provider. ° °

## 2013-12-27 NOTE — ED Notes (Signed)
Provided a cold drink to pt.

## 2013-12-27 NOTE — ED Notes (Signed)
Pt sent from eye Dr Wynetta Emery for further eval of right eye problems. Pt had test done in eye Dr, but recommended to come to ED for scan.

## 2013-12-27 NOTE — ED Notes (Signed)
Tiffany, PA at bedside.

## 2013-12-28 NOTE — ED Provider Notes (Signed)
Medical screening examination/treatment/procedure(s) were performed by non-physician practitioner and as supervising physician I was immediately available for consultation/collaboration.   EKG Interpretation None        Debby Freiberg, MD 12/28/13 [phone]

## 2016-06-29 DIAGNOSIS — Z1231 Encounter for screening mammogram for malignant neoplasm of breast: Secondary | ICD-10-CM | POA: Diagnosis not present

## 2016-06-29 DIAGNOSIS — Z124 Encounter for screening for malignant neoplasm of cervix: Secondary | ICD-10-CM | POA: Diagnosis not present

## 2016-06-29 DIAGNOSIS — N920 Excessive and frequent menstruation with regular cycle: Secondary | ICD-10-CM | POA: Diagnosis not present

## 2016-06-29 DIAGNOSIS — Z6835 Body mass index (BMI) 35.0-35.9, adult: Secondary | ICD-10-CM | POA: Diagnosis not present

## 2016-06-29 DIAGNOSIS — Z01419 Encounter for gynecological examination (general) (routine) without abnormal findings: Secondary | ICD-10-CM | POA: Diagnosis not present

## 2017-02-12 DIAGNOSIS — F3175 Bipolar disorder, in partial remission, most recent episode depressed: Secondary | ICD-10-CM | POA: Diagnosis not present

## 2017-09-26 DIAGNOSIS — F3175 Bipolar disorder, in partial remission, most recent episode depressed: Secondary | ICD-10-CM | POA: Diagnosis not present

## 2017-11-28 DIAGNOSIS — F3175 Bipolar disorder, in partial remission, most recent episode depressed: Secondary | ICD-10-CM | POA: Diagnosis not present

## 2018-05-24 DIAGNOSIS — Z23 Encounter for immunization: Secondary | ICD-10-CM | POA: Diagnosis not present

## 2018-05-24 DIAGNOSIS — H6121 Impacted cerumen, right ear: Secondary | ICD-10-CM | POA: Diagnosis not present

## 2018-05-24 DIAGNOSIS — Z13220 Encounter for screening for lipoid disorders: Secondary | ICD-10-CM | POA: Diagnosis not present

## 2018-05-24 DIAGNOSIS — Z0000 Encounter for general adult medical examination without abnormal findings: Secondary | ICD-10-CM | POA: Diagnosis not present

## 2018-05-30 ENCOUNTER — Ambulatory Visit: Payer: BLUE CROSS/BLUE SHIELD | Admitting: Physician Assistant

## 2018-05-30 ENCOUNTER — Encounter: Payer: Self-pay | Admitting: Physician Assistant

## 2018-05-30 DIAGNOSIS — F17200 Nicotine dependence, unspecified, uncomplicated: Secondary | ICD-10-CM

## 2018-05-30 DIAGNOSIS — F319 Bipolar disorder, unspecified: Secondary | ICD-10-CM | POA: Diagnosis not present

## 2018-05-30 DIAGNOSIS — F411 Generalized anxiety disorder: Secondary | ICD-10-CM

## 2018-05-30 MED ORDER — LORAZEPAM 0.5 MG PO TABS: 0.5000 mg | ORAL_TABLET | Freq: Four times a day (QID) | ORAL | 1 refills | Status: DC | PRN

## 2018-05-30 MED ORDER — RISPERIDONE 3 MG PO TABS: 3.0000 mg | ORAL_TABLET | Freq: Every day | ORAL | 1 refills | Status: DC

## 2018-05-30 MED ORDER — LORAZEPAM 0.5 MG PO TABS: 0.5000 mg | ORAL_TABLET | Freq: Four times a day (QID) | ORAL | 0 refills | Status: DC | PRN

## 2018-05-30 NOTE — Progress Notes (Signed)
[facility]  Patient ID: [name],  MRN: [id number]  PCP: Patient, No Pcp Per  Date of Evaluation: 05/30/2018 Time spent:15 minutes  Chief Complaint:   HISTORY/CURRENT STATUS: HPI Here for routine f/u.   Given Chantix in 8/19. Caused increased anger, paranoia.  She only took 1 pill and decided not to take anymore.  It took a few days for those symptoms to wear off.  States now that she is going to work on losing weight, not quitting smoking.  She will let me know when and if she needs my help to quit.  Patient denies loss of interest in usual activities and is able to enjoy things.  Denies decreased energy or motivation.  Appetite has not changed.  No extreme sadness, tearfulness, or feelings of hopelessness.  Denies any changes in concentration, making decisions or remembering things.  Denies suicidal or homicidal thoughts.  Is well controlled with the Ativan.  He usually takes it at least 3 times a day.  Sometimes 4.  Has panic attacks where she feels super anxious and will sometimes have palpitations but the Ativan does help to prevent it from worsening.  No shortness of breath, sweaty palms or dizziness.  Patient denies increased energy with decreased need for sleep, no increased talkativeness, no racing thoughts, no impulsivity or risky behaviors, no increased spending, no increased libido, no grandiosity.  Denies hallucinations.  Work is going well.  She sleeps good most of the time.  Individual Medical History/ Review of Systems: Changes? :No    Past medications for mental health diagnoses include: Lithium, Wellbutrin was not effective for depression or decreased desire for nicotine, Risperdal, Lamictal, Ativan, Chantix caused paranoia and increased anger.  Allergies: Patient has no known allergies.  Current Medications:  Current Outpatient Medications:  .  lamoTRIgine (LAMICTAL) 200 MG tablet, Take 200 mg by mouth daily., Disp: , Rfl:  .  LORazepam (ATIVAN)  0.5 MG tablet, Take 1 tablet (0.5 mg total) by mouth every 6 (six) hours as needed for anxiety., Disp: 30 tablet, Rfl: 0 .  risperiDONE (RISPERDAL) 3 MG tablet, Take 1 tablet (3 mg total) by mouth at bedtime., Disp: 90 tablet, Rfl: 1 .  LORazepam (ATIVAN) 0.5 MG tablet, Take 1 tablet (0.5 mg total) by mouth every 6 (six) hours as needed for anxiety., Disp: 360 tablet, Rfl: 1 Medication Side Effects: none  Family Medical/ Social History: Changes? No  MENTAL HEALTH EXAM:  There were no vitals taken for this visit.There is no height or weight on file to calculate BMI.  General Appearance: Casual, Well Groomed and Obese  Eye Contact:  Good  Speech:  Clear and Coherent  Volume:  Normal  Mood:  Euthymic  Affect:  Appropriate  Thought Process:  Goal Directed  Orientation:  Full (Time, Place, and Person)  Thought Content: Logical   Suicidal Thoughts:  No  Homicidal Thoughts:  No  Memory:  WNL  Judgement:  Good  Insight:  Good  Psychomotor Activity:  Normal  Concentration:  Concentration: Good  Recall:  Good  Fund of Knowledge: Good  Language: Good  Assets:  Desire for Improvement  ADL's:  Intact  Cognition: WNL  Prognosis:  Good    DIAGNOSES:    ICD-10-CM   1. Bipolar I disorder ([place]) F31.9   2. Generalized anxiety disorder F41.1   3. Smoker F17.200     Receiving Psychotherapy: No    RECOMMENDATIONS: PDMP was reviewed. Continue Risperdal 3 mg p.o. nightly. Continue Lamictal 200 mg  p.o. nightly. Continue Ativan 0.5 mg every 6 hours as needed anxiety.  She only has 3 pills left so I am sending in a prescription to her local pharmacy as well as a prescription through [facility]. Get labs that she had done recently through her PCP. Return in 9 months or sooner as needed.   Donnal Moat, PA-C

## 2018-05-31 ENCOUNTER — Encounter (HOSPITAL_COMMUNITY): Payer: Self-pay | Admitting: Emergency Medicine

## 2018-06-12 DIAGNOSIS — Z0000 Encounter for general adult medical examination without abnormal findings: Secondary | ICD-10-CM | POA: Diagnosis not present

## 2018-06-28 DIAGNOSIS — N920 Excessive and frequent menstruation with regular cycle: Secondary | ICD-10-CM | POA: Diagnosis not present

## 2018-06-28 DIAGNOSIS — Z6835 Body mass index (BMI) 35.0-35.9, adult: Secondary | ICD-10-CM | POA: Diagnosis not present

## 2018-06-28 DIAGNOSIS — Z01419 Encounter for gynecological examination (general) (routine) without abnormal findings: Secondary | ICD-10-CM | POA: Diagnosis not present

## 2018-06-28 DIAGNOSIS — Z1231 Encounter for screening mammogram for malignant neoplasm of breast: Secondary | ICD-10-CM | POA: Diagnosis not present

## 2018-08-14 ENCOUNTER — Other Ambulatory Visit: Payer: Self-pay | Admitting: Physician Assistant

## 2018-11-19 ENCOUNTER — Other Ambulatory Visit: Payer: Self-pay

## 2018-11-19 ENCOUNTER — Telehealth: Payer: Self-pay | Admitting: Physician Assistant

## 2018-11-19 MED ORDER — LORAZEPAM 0.5 MG PO TABS: 0.5000 mg | ORAL_TABLET | Freq: Four times a day (QID) | ORAL | 1 refills | Status: DC | PRN

## 2018-11-19 NOTE — Telephone Encounter (Signed)
Patient called and said that she needs a refill on her lorazapam 0.5mg  [facility]

## 2018-11-19 NOTE — Telephone Encounter (Signed)
Pended for approval.

## 2018-11-27 ENCOUNTER — Other Ambulatory Visit: Payer: Self-pay

## 2018-11-27 ENCOUNTER — Telehealth: Payer: Self-pay | Admitting: Physician Assistant

## 2018-11-27 MED ORDER — LORAZEPAM 0.5 MG PO TABS: 0.5000 mg | ORAL_TABLET | Freq: Four times a day (QID) | ORAL | 0 refills | Status: DC | PRN

## 2018-11-27 NOTE — Telephone Encounter (Signed)
Will pend for approval

## 2018-11-27 NOTE — Telephone Encounter (Signed)
Script for Phineas Semen was sent to [facility] and they stated the order will take another 8 days to get in patient would like 30 day refill on Lorazapem sent to [facility] on [address].

## 2018-12-07 ENCOUNTER — Other Ambulatory Visit: Payer: Self-pay | Admitting: Physician Assistant

## 2019-02-28 ENCOUNTER — Encounter: Payer: Self-pay | Admitting: Physician Assistant

## 2019-02-28 ENCOUNTER — Other Ambulatory Visit: Payer: Self-pay

## 2019-02-28 ENCOUNTER — Ambulatory Visit (INDEPENDENT_AMBULATORY_CARE_PROVIDER_SITE_OTHER): Payer: BC Managed Care – PPO | Admitting: Physician Assistant

## 2019-02-28 DIAGNOSIS — F319 Bipolar disorder, unspecified: Secondary | ICD-10-CM | POA: Diagnosis not present

## 2019-02-28 DIAGNOSIS — F411 Generalized anxiety disorder: Secondary | ICD-10-CM | POA: Diagnosis not present

## 2019-02-28 MED ORDER — LORAZEPAM 1 MG PO TABS: 1.0000 mg | ORAL_TABLET | Freq: Three times a day (TID) | ORAL | 1 refills | Status: DC | PRN

## 2019-02-28 MED ORDER — RISPERIDONE 4 MG PO TABS: 4.0000 mg | ORAL_TABLET | Freq: Every day | ORAL | 1 refills | Status: DC

## 2019-02-28 NOTE — Progress Notes (Signed)
[facility]  Patient ID: [name],  MRN: [id number]  PCP: Patient, No Pcp Per  Date of Evaluation: 02/28/2019 Time spent:15 minutes  Chief Complaint:  Chief Complaint    Anxiety; Depression; Follow-up     Virtual Visit via Telephone Note  I connected with patient by a video enabled telemedicine application or telephone, with their informed consent, and verified patient privacy and that I am speaking with the correct person using two identifiers.  I am private, in my office and the patient is at home.   I discussed the limitations, risks, security and privacy concerns of performing an evaluation and management service by telephone and the availability of in person appointments. I also discussed with the patient that there may be a patient responsible charge related to this service. The patient expressed understanding and agreed to proceed.   I discussed the assessment and treatment plan with the patient. The patient was provided an opportunity to ask questions and all were answered. The patient agreed with the plan and demonstrated an understanding of the instructions.   The patient was advised to call back or seek an in-person evaluation if the symptoms worsen or if the condition fails to improve as anticipated.  I provided 15 minutes of non-face-to-face time during this encounter.  HISTORY/CURRENT STATUS: HPI For routine med check.  Not doing great right now.  Her 44 year old and 50 year old kids are doing school digitally.  Hopefully they will go back to classroom learning January 5.  The patient has been working from home since March.  She works 40 hours a week.  It stressful being in the same house with them, all on different computers different classes, and the kids are fussing and fighting all the time.  "I just feel really stressed.  I am not happy about anything.  I do not have a lot of energy or motivation.  I get nervous and have to take the Ativan.  For the  past 2 or 3 weeks have been taking 5 pills throughout the day and I know I will run out early but is the only thing that will get me through."  She sleeps well but does not feel rested when she gets up.  Denies suicidal or homicidal thoughts.  Patient denies increased energy with decreased need for sleep, no increased talkativeness, no racing thoughts, no impulsivity or risky behaviors, no increased spending, no increased libido, no grandiosity.  Denies dizziness, syncope, seizures, numbness, tingling, tremor, tics, unsteady gait, slurred speech, confusion. Denies muscle or joint pain, stiffness, or dystonia.  Individual Medical History/ Review of Systems: Changes? :No    Past medications for mental health diagnoses include: Lithium, Wellbutrin was not effective for depression or decreased desire for nicotine, Risperdal, Lamictal, Ativan, Chantix caused paranoia and increased anger  Allergies: Patient has no known allergies.  Current Medications:  Current Outpatient Medications:  .  lamoTRIgine (LAMICTAL) 200 MG tablet, TAKE 1 TABLET AT BEDTIME, Disp: 90 tablet, Rfl: 3 .  LORazepam (ATIVAN) 1 MG tablet, Take 1 tablet (1 mg total) by mouth every 8 (eight) hours as needed for anxiety., Disp: 90 tablet, Rfl: 1 .  risperidone (RISPERDAL) 4 MG tablet, Take 1 tablet (4 mg total) by mouth daily., Disp: 30 tablet, Rfl: 1 Medication Side Effects: none  Family Medical/ Social History: Changes? Yes much more anxious b/c kids are home doing school online, plus she's working from home.   MENTAL HEALTH EXAM:  There were no vitals taken for this visit.There is  no height or weight on file to calculate BMI.  General Appearance: Unable to assess  Eye Contact:  Unable to assess  Speech:  Clear and Coherent  Volume:  Normal  Mood:  Euthymic  Affect:  Unable to assess  Thought Process:  Goal Directed and Descriptions of Associations: Intact  Orientation:  Full (Time, Place, and Person)  Thought Content:  Logical   Suicidal Thoughts:  No  Homicidal Thoughts:  No  Memory:  WNL  Judgement:  Good  Insight:  Good  Psychomotor Activity:  Unable to assess  Concentration:  Concentration: Good  Recall:  Good  Fund of Knowledge: Good  Language: Good  Assets:  Desire for Improvement  ADL's:  Intact  Cognition: WNL  Prognosis:  Good    DIAGNOSES:    ICD-10-CM   1. Bipolar I disorder ([place])  F31.9   2. Generalized anxiety disorder  F41.1     Receiving Psychotherapy: No    RECOMMENDATIONS:  PDMP was reviewed. Increase Risperdal to 4 mg nightly. Increase Ativan to 1 mg p.o. 3 times daily.  Or she can take one half p.o. up to 6 times a day.  She understands not to take more than 3 mg daily. Continue Lamictal 200 mg nightly. Disc coping techniques. Return in 4 weeks.  Donnal Moat, PA-C

## 2019-03-22 ENCOUNTER — Other Ambulatory Visit: Payer: Self-pay | Admitting: Physician Assistant

## 2019-04-02 ENCOUNTER — Other Ambulatory Visit: Payer: Self-pay

## 2019-04-02 ENCOUNTER — Ambulatory Visit: Payer: BC Managed Care – PPO | Admitting: Physician Assistant

## 2019-04-02 ENCOUNTER — Telehealth: Payer: Self-pay | Admitting: Physician Assistant

## 2019-04-02 MED ORDER — RISPERIDONE 4 MG PO TABS: 4.0000 mg | ORAL_TABLET | Freq: Every day | ORAL | 1 refills | Status: DC

## 2019-04-02 MED ORDER — LAMOTRIGINE 200 MG PO TABS: 200.0000 mg | ORAL_TABLET | Freq: Every day | ORAL | 1 refills | Status: DC

## 2019-04-02 NOTE — Telephone Encounter (Signed)
Last refill 30 day Lorazepam 03/29/2019, will pend for 90 day to be [facility] to [facility]  Lamictal and Risperdal submitted for 90 day to [facility]

## 2019-04-02 NOTE — Telephone Encounter (Signed)
Had to cancel [facility] appt today. R/S for Jan 22nd. Pt is asking for 90 day refills of her meds, Lamictal, Risperdal, and [facility]. Thanks.

## 2019-04-03 MED ORDER — LORAZEPAM 1 MG PO TABS: 1.0000 mg | ORAL_TABLET | Freq: Three times a day (TID) | ORAL | 0 refills | Status: DC | PRN

## 2019-05-09 ENCOUNTER — Encounter: Payer: Self-pay | Admitting: Physician Assistant

## 2019-05-09 ENCOUNTER — Ambulatory Visit (INDEPENDENT_AMBULATORY_CARE_PROVIDER_SITE_OTHER): Payer: BC Managed Care – PPO | Admitting: Physician Assistant

## 2019-05-09 DIAGNOSIS — F411 Generalized anxiety disorder: Secondary | ICD-10-CM | POA: Diagnosis not present

## 2019-05-09 DIAGNOSIS — F319 Bipolar disorder, unspecified: Secondary | ICD-10-CM

## 2019-05-09 MED ORDER — LORAZEPAM 1 MG PO TABS: 1.0000 mg | ORAL_TABLET | Freq: Four times a day (QID) | ORAL | 0 refills | Status: DC | PRN

## 2019-05-09 NOTE — Progress Notes (Signed)
[facility]  Patient ID: [name],  MRN: [id number]  PCP: Patient, No Pcp Per  Date of Evaluation: 05/09/2019 Time spent:30 minutes  Chief Complaint:  Chief Complaint    Anxiety; Depression; Follow-up     Virtual Visit via Telephone Note  I connected with patient by a video enabled telemedicine application or telephone, with their informed consent, and verified patient privacy and that I am speaking with the correct person using two identifiers.  I am private, in my office and the patient is home.  I discussed the limitations, risks, security and privacy concerns of performing an evaluation and management service by telephone and the availability of in person appointments. I also discussed with the patient that there may be a patient responsible charge related to this service. The patient expressed understanding and agreed to proceed.   I discussed the assessment and treatment plan with the patient. The patient was provided an opportunity to ask questions and all were answered. The patient agreed with the plan and demonstrated an understanding of the instructions.   The patient was advised to call back or seek an in-person evaluation if the symptoms worsen or if the condition fails to improve as anticipated.  I provided 30 minutes of non-face-to-face time during this encounter.  HISTORY/CURRENT STATUS: HPI for medication management.  At the last visit, 2 months ago we increased the Risperdal.  The patient was having quite a bit of depressive symptoms then as well as more anxiety.  The increase in dose has been helpful.  She still has 2 to 3 days a week where she needs a fourth Ativan, depending on what is going on around her, but reports that she tries not to take an extra 1.  She is still having a lot of stress working from home and her oldest child who is 52 is still doing online school at home.  Her youngest is now back in the classroom.  She is more able to enjoy  things.  Her energy and motivation are good most of the time, but again depends on what is going on around her.  She does not cry easily.  She is usually able to focus well without a lot of distractions and complete tasks in a timely manner.  Memory is good.  No suicidal or homicidal thoughts.  Patient denies increased energy with decreased need for sleep, no increased talkativeness, no racing thoughts, no impulsivity or risky behaviors, no increased spending, no increased libido, no grandiosity.  Denies dizziness, syncope, seizures, numbness, tingling, tremor, tics, unsteady gait, slurred speech, confusion. Denies muscle or joint pain, stiffness, or dystonia.  No polyuria or polydipsia.  No galactorrhea.  She has not had any recent lab work, specifically for glucose and lipids.  Individual Medical History/ Review of Systems: Changes? :No    Past medications for mental health diagnoses include: Lithium, Wellbutrin was not effective for depression or decreased desire for nicotine, Risperdal, Lamictal, Ativan, Chantix caused paranoia and increased anger  Allergies: Patient has no known allergies.  Current Medications:  Current Outpatient Medications:  .  lamoTRIgine (LAMICTAL) 200 MG tablet, Take 1 tablet (200 mg total) by mouth at bedtime., Disp: 90 tablet, Rfl: 1 .  LORazepam (ATIVAN) 1 MG tablet, Take 1 tablet (1 mg total) by mouth 3 (three) times daily as needed for anxiety., Disp: 270 tablet, Rfl: 0 .  risperidone (RISPERDAL) 4 MG tablet, Take 1 tablet (4 mg total) by mouth at bedtime., Disp: 90 tablet, Rfl: 1 Medication  Side Effects: none  Family Medical/ Social History: Changes? Yes her youngest child (79 yo) has gone back to school and isn't doing virtual learning at home now.  She's not failing.  That has taken a huge load off her shoulders.   MENTAL HEALTH EXAM:  There were no vitals taken for this visit.There is no height or weight on file to calculate BMI.  General Appearance:  Unable to assess  Eye Contact:  Unable to assess  Speech:  Clear and Coherent  Volume:  Normal  Mood:  Euthymic  Affect:  Unable to assess  Thought Process:  Goal Directed  Orientation:  Full (Time, Place, and Person)  Thought Content: Logical   Suicidal Thoughts:  No  Homicidal Thoughts:  No  Memory:  WNL  Judgement:  Good  Insight:  Good  Psychomotor Activity:  Unable to assess  Concentration:  Concentration: Good  Recall:  Good  Fund of Knowledge: Good  Language: Good  Assets:  Desire for Improvement  ADL's:  Intact  Cognition: WNL  Prognosis:  Good    DIAGNOSES:    ICD-10-CM   1. Generalized anxiety disorder  F41.1   2. Bipolar I disorder ([place])  F31.9     Receiving Psychotherapy: No    RECOMMENDATIONS:  PDMP was reviewed. I spent 30 minutes with her. Discussed the need for labs including glucose and lipid panel.  Patient states she has a physical coming up soon and will have those drawn then.  States she will get the lab results to me.  I stressed the importance of having labs due to the fact that Risperdal may increase the risk of diabetes or hyperlipidemia.  She verbalizes understanding. We also discussed the possible risks of dementia from long-term use of benzodiazepines.  She verbalizes understanding and accepts the risks.  We agree that the benefits outweigh the risks at this point. Continue Lamictal 200 mg 1 nightly. Increase Ativan 1 mg up to 4 times daily as needed.  She knows not to do this every day as I am only giving her extra so that she can take a fourth pill maybe 3 days a week or so.  I know that she will run out of her current prescription early and gave instructions to the pharmacy to fill her next Ativan prescription early. Continue Risperdal 4 mg nightly. Return in 6 months.  Donnal Moat, PA-C

## 2019-08-18 ENCOUNTER — Other Ambulatory Visit: Payer: Self-pay

## 2019-08-18 ENCOUNTER — Telehealth: Payer: Self-pay | Admitting: Physician Assistant

## 2019-08-18 MED ORDER — LORAZEPAM 1 MG PO TABS: 1.0000 mg | ORAL_TABLET | Freq: Four times a day (QID) | ORAL | 0 refills | Status: DC | PRN

## 2019-08-18 NOTE — Telephone Encounter (Signed)
Last refill 05/16/2019 #310 Pended for Helene Kelp to submit to [facility]

## 2019-08-18 NOTE — Telephone Encounter (Signed)
Pt requesting refill for Lorazepam 1 mg @ [facility]

## 2019-09-08 ENCOUNTER — Other Ambulatory Visit: Payer: Self-pay | Admitting: Physician Assistant

## 2019-11-04 ENCOUNTER — Telehealth (INDEPENDENT_AMBULATORY_CARE_PROVIDER_SITE_OTHER): Payer: BC Managed Care – PPO | Admitting: Physician Assistant

## 2019-11-04 ENCOUNTER — Telehealth: Payer: Self-pay | Admitting: Physician Assistant

## 2019-11-04 ENCOUNTER — Other Ambulatory Visit: Payer: Self-pay | Admitting: Physician Assistant

## 2019-11-04 ENCOUNTER — Encounter: Payer: Self-pay | Admitting: Physician Assistant

## 2019-11-04 DIAGNOSIS — F411 Generalized anxiety disorder: Secondary | ICD-10-CM | POA: Diagnosis not present

## 2019-11-04 DIAGNOSIS — F319 Bipolar disorder, unspecified: Secondary | ICD-10-CM | POA: Diagnosis not present

## 2019-11-04 MED ORDER — LORAZEPAM 1 MG PO TABS: 1.0000 mg | ORAL_TABLET | Freq: Four times a day (QID) | ORAL | 1 refills | Status: DC | PRN

## 2019-11-04 MED ORDER — LAMOTRIGINE 200 MG PO TABS: 200.0000 mg | ORAL_TABLET | Freq: Every day | ORAL | 3 refills | Status: DC

## 2019-11-04 NOTE — Telephone Encounter (Signed)
Ms. terrika, zuver are scheduled for a virtual visit with your provider today.    Just as we do with appointments in the office, we must obtain your consent to participate.  Your consent will be active for this visit and any virtual visit you may have with one of our providers in the next 365 days.    If you have a MyChart account, I can also send a copy of this consent to you electronically.  All virtual visits are billed to your insurance company just like a traditional visit in the office.  As this is a virtual visit, video technology does not allow for your provider to perform a traditional examination.  This may limit your provider's ability to fully assess your condition.  If your provider identifies any concerns that need to be evaluated in person or the need to arrange testing such as labs, EKG, etc, we will make arrangements to do so.    Although advances in technology are sophisticated, we cannot ensure that it will always work on either your end or our end.  If the connection with a video visit is poor, we may have to switch to a telephone visit.  With either a video or telephone visit, we are not always able to ensure that we have a secure connection.   I need to obtain your verbal consent now.   Are you willing to proceed with your visit today?   Leotta Weingarten has provided verbal consent on 11/04/2019 for a virtual visit (video or telephone).   Donnal Moat, PA-C 11/04/2019  10:22 AM

## 2019-11-04 NOTE — Progress Notes (Signed)
[facility]  Patient ID: [name],  MRN: [id number]  PCP: Patient, No Pcp Per  Date of Evaluation: 11/04/2019 Time spent:20 minutes  Chief Complaint:  Chief Complaint    Medication Refill     Virtual Visit via Telehealth  I connected with patient by telephone, with their informed consent, and verified patient privacy and that I am speaking with the correct person using two identifiers.  I am private, in my office and the patient is at home.  I discussed the limitations, risks, security and privacy concerns of performing an evaluation and management service by telephone and the availability of in person appointments. I also discussed with the patient that there may be a patient responsible charge related to this service. The patient expressed understanding and agreed to proceed.   I discussed the assessment and treatment plan with the patient. The patient was provided an opportunity to ask questions and all were answered. The patient agreed with the plan and demonstrated an understanding of the instructions.   The patient was advised to call back or seek an in-person evaluation if the symptoms worsen or if the condition fails to improve as anticipated.  I provided 20 minutes of non-face-to-face time during this encounter.  HISTORY/CURRENT STATUS: HPI for medication management.  She is doing well and feels like her medications are working just fine.  She is still working from home but will go back to the office in a couple of weeks.  She will be working from home 1 week and then in the office 1 week indefinitely.  She is able to enjoy things.  Energy and motivation are good.  She is not crying easily.  Denies feelings of hopelessness, change in memory, or problems with concentration.  Sleeps well.  She denies suicidal or homicidal thoughts.  Anxiety is well controlled.  She does take the Ativan every day routinely and it is still helpful with the generalized anxiety  and with the occasional panic attacks.  Patient denies increased energy with decreased need for sleep, no increased talkativeness, no racing thoughts, no impulsivity or risky behaviors, no increased spending, no increased libido, no grandiosity, no paranoia, no hallucinations.  We will be having labs including blood sugar and cholesterol panel in a couple of weeks when she goes back to work.  Denies dizziness, syncope, seizures, numbness, tingling, tremor, tics, unsteady gait, slurred speech, confusion. Denies muscle or joint pain, stiffness, or dystonia.  No polyuria or polydipsia.  No galactorrhea.  Individual Medical History/ Review of Systems: Changes? :No    Past medications for mental health diagnoses include: Lithium, Wellbutrin was not effective for depression or decreased desire for nicotine, Risperdal, Lamictal, Ativan, Chantix caused paranoia and increased anger  Allergies: Patient has no known allergies.  Current Medications:  Current Outpatient Medications:  .  lamoTRIgine (LAMICTAL) 200 MG tablet, Take 1 tablet (200 mg total) by mouth at bedtime., Disp: 90 tablet, Rfl: 3 .  LORazepam (ATIVAN) 1 MG tablet, Take 1 tablet (1 mg total) by mouth every 6 (six) hours as needed for anxiety., Disp: 310 tablet, Rfl: 1 .  risperidone (RISPERDAL) 4 MG tablet, TAKE 1 TABLET AT BEDTIME, Disp: 90 tablet, Rfl: 3 Medication Side Effects: none  Family Medical/ Social History: Changes? Yes her youngest child (54 yo) has gone back to school and isn't doing virtual learning at home now.  She's not failing.  That has taken a huge load off her shoulders.   MENTAL HEALTH EXAM:  There were no  vitals taken for this visit.There is no height or weight on file to calculate BMI.  General Appearance: Unable to assess  Eye Contact:  Unable to assess  Speech:  Clear and Coherent  Volume:  Normal  Mood:  Euthymic  Affect:  Unable to assess  Thought Process:  Goal Directed  Orientation:  Full (Time,  Place, and Person)  Thought Content: Logical   Suicidal Thoughts:  No  Homicidal Thoughts:  No  Memory:  WNL  Judgement:  Good  Insight:  Good  Psychomotor Activity:  Unable to assess  Concentration:  Concentration: Good  Recall:  Good  Fund of Knowledge: Good  Language: Good  Assets:  Desire for Improvement  ADL's:  Intact  Cognition: WNL  Prognosis:  Good    DIAGNOSES:    ICD-10-CM   1. Bipolar I disorder ([place])  F31.9   2. Generalized anxiety disorder  F41.1     Receiving Psychotherapy: No    RECOMMENDATIONS:  PDMP was reviewed. I provided 20 minutes of nonface-to-face time during this encounter. I am glad she is doing so well! Continue Lamictal 200 mg 1 nightly. Continue Ativan 1 mg, 1 p.o. 4 times daily as needed.  Of course try to keep the dose as low as possible.   Continue Risperdal 4 mg nightly. She will have upcoming labs faxed to me. Return in 6 months.  Donnal Moat, PA-C

## 2020-01-13 ENCOUNTER — Telehealth: Payer: Self-pay | Admitting: Physician Assistant

## 2020-01-13 ENCOUNTER — Other Ambulatory Visit: Payer: Self-pay

## 2020-01-13 MED ORDER — LORAZEPAM 1 MG PO TABS: 1.0000 mg | ORAL_TABLET | Freq: Four times a day (QID) | ORAL | 1 refills | Status: DC | PRN

## 2020-01-13 NOTE — Telephone Encounter (Signed)
Last refill 11/06/2019 for 78 day supply Next apt is in Jan. 2022 Pended for [name] to send [facility]

## 2020-01-13 NOTE — Telephone Encounter (Signed)
Pt needs PA for Lorazepam 1 mg 3/d per [facility] expired 11/28/19. Will need to get refills in OCT. CONTACT # FOR PT [phone]

## 2020-01-19 ENCOUNTER — Telehealth: Payer: Self-pay | Admitting: Physician Assistant

## 2020-01-19 ENCOUNTER — Other Ambulatory Visit: Payer: Self-pay | Admitting: Physician Assistant

## 2020-01-19 MED ORDER — LORAZEPAM 1 MG PO TABS: 1.0000 mg | ORAL_TABLET | Freq: Four times a day (QID) | ORAL | 0 refills | Status: DC | PRN

## 2020-01-19 NOTE — Telephone Encounter (Signed)
Prescription was sent

## 2020-01-19 NOTE — Telephone Encounter (Signed)
Needing a 10 day supply of Ativan sent locally.  She does have a PA pending at this time.

## 2020-01-19 NOTE — Telephone Encounter (Signed)
Yes her PA is still pending.

## 2020-01-19 NOTE — Telephone Encounter (Signed)
Pt called and left a message that her ativan script is still under review at [facility], She only has 4 pills left and would like a 10 day supply to [facility] to [facility] on [address] to get her by until the one from [facility] come s in.

## 2020-01-28 NOTE — Telephone Encounter (Signed)
Patient was able to fill medication on 01/21/20 #310 of Lorazepam with her insurance.

## 2020-04-27 DIAGNOSIS — Z0000 Encounter for general adult medical examination without abnormal findings: Secondary | ICD-10-CM | POA: Diagnosis not present

## 2020-04-27 DIAGNOSIS — Z13220 Encounter for screening for lipoid disorders: Secondary | ICD-10-CM | POA: Diagnosis not present

## 2020-05-04 ENCOUNTER — Telehealth: Payer: BC Managed Care – PPO | Admitting: Physician Assistant

## 2020-05-07 ENCOUNTER — Telehealth (INDEPENDENT_AMBULATORY_CARE_PROVIDER_SITE_OTHER): Payer: BC Managed Care – PPO | Admitting: Physician Assistant

## 2020-05-07 ENCOUNTER — Other Ambulatory Visit: Payer: Self-pay

## 2020-05-07 ENCOUNTER — Encounter: Payer: Self-pay | Admitting: Physician Assistant

## 2020-05-07 DIAGNOSIS — Z720 Tobacco use: Secondary | ICD-10-CM | POA: Diagnosis not present

## 2020-05-07 DIAGNOSIS — F411 Generalized anxiety disorder: Secondary | ICD-10-CM | POA: Diagnosis not present

## 2020-05-07 DIAGNOSIS — F319 Bipolar disorder, unspecified: Secondary | ICD-10-CM | POA: Diagnosis not present

## 2020-05-07 MED ORDER — ALPRAZOLAM 1 MG PO TABS: ORAL_TABLET | ORAL | 1 refills | Status: DC

## 2020-05-07 MED ORDER — RISPERIDONE 4 MG PO TABS: 4.0000 mg | ORAL_TABLET | Freq: Every day | ORAL | 3 refills | Status: DC

## 2020-05-07 MED ORDER — LAMOTRIGINE 200 MG PO TABS: 200.0000 mg | ORAL_TABLET | Freq: Every day | ORAL | 3 refills | Status: DC

## 2020-05-07 NOTE — Progress Notes (Signed)
[facility]  Patient ID: [name],  MRN: [id number]  PCP: Patient, No Pcp Per  Date of Evaluation: 05/07/2020 Time spent:30 minutes  Chief Complaint:  Chief Complaint    Anxiety; Depression; Insomnia     Virtual Visit via Telehealth  I connected with patient by telephone, with their informed consent, and verified patient privacy and that I am speaking with the correct person using two identifiers.  I am private, in my office and the patient is at home.  I discussed the limitations, risks, security and privacy concerns of performing an evaluation and management service by telephone and the availability of in person appointments. I also discussed with the patient that there may be a patient responsible charge related to this service. The patient expressed understanding and agreed to proceed.   I discussed the assessment and treatment plan with the patient. The patient was provided an opportunity to ask questions and all were answered. The patient agreed with the plan and demonstrated an understanding of the instructions.   The patient was advised to call back or seek an in-person evaluation if the symptoms worsen or if the condition fails to improve as anticipated.  I provided 30 minutes of non-face-to-face time during this encounter.  HISTORY/CURRENT STATUS: For 6 month medication check.  Has been more stressed, with teenager, and is needing the Ativan 4 times daily.  It does not seem to be working as well as it used to.  She does not usually have panic attacks but feels more on edge all throughout the day.  She is able to enjoy things.  Energy and motivation are good.  She is not crying easily.  Denies feelings of hopelessness, change in memory, or problems with concentration.  Sleeps well.  She denies suicidal or homicidal thoughts.  Patient denies increased energy with decreased need for sleep, no increased talkativeness, no racing thoughts, no impulsivity or  risky behaviors, no increased spending, no increased libido, no grandiosity, no paranoia, no hallucinations.  Denies dizziness, syncope, seizures, numbness, tingling, tremor, tics, unsteady gait, slurred speech, confusion. Denies muscle or joint pain, stiffness, or dystonia.  No polyuria or polydipsia.  No galactorrhea.  Individual Medical History/ Review of Systems: Changes? :No    Past medications for mental health diagnoses include: Lithium, Wellbutrin was not effective for depression or decreased desire for nicotine, Risperdal, Lamictal, Ativan, Chantix caused paranoia and increased anger  Allergies: Patient has no known allergies.  Current Medications:  Current Outpatient Medications:  .  ALPRAZolam (XANAX) 1 MG tablet, 1 po bid prn and may take 1/2 prn during day prn., Disp: 75 tablet, Rfl: 1 .  LORazepam (ATIVAN) 1 MG tablet, Take 1 tablet (1 mg total) by mouth every 6 (six) hours as needed for anxiety., Disp: 310 tablet, Rfl: 1 .  LORazepam (ATIVAN) 1 MG tablet, Take 1 tablet (1 mg total) by mouth every 6 (six) hours as needed for anxiety., Disp: 40 tablet, Rfl: 0 .  lamoTRIgine (LAMICTAL) 200 MG tablet, Take 1 tablet (200 mg total) by mouth at bedtime., Disp: 90 tablet, Rfl: 3 .  risperidone (RISPERDAL) 4 MG tablet, Take 1 tablet (4 mg total) by mouth at bedtime., Disp: 90 tablet, Rfl: 3 Medication Side Effects: none  Family Medical/ Social History: Changes?    MENTAL HEALTH EXAM:  There were no vitals taken for this visit.There is no height or weight on file to calculate BMI.  General Appearance: Unable to assess  Eye Contact:  Unable to assess  Speech:  Clear and Coherent  Volume:  Normal  Mood:  Euthymic  Affect:  Unable to assess  Thought Process:  Goal Directed  Orientation:  Full (Time, Place, and Person)  Thought Content: Logical   Suicidal Thoughts:  No  Homicidal Thoughts:  No  Memory:  WNL  Judgement:  Good  Insight:  Good  Psychomotor Activity:  Unable to  assess  Concentration:  Concentration: Good  Recall:  Good  Fund of Knowledge: Good  Language: Good  Assets:  Desire for Improvement  ADL's:  Intact  Cognition: WNL  Prognosis:  Good   Most recent pertinent labs at [facility]: 1//02/2021 Glucose was nl, LFTs and kidney functions nl Total chol 143, LDL 73, HDL 43,  Trig 86  DIAGNOSES:    ICD-10-CM   1. Bipolar I disorder ([place])  F31.9   2. Generalized anxiety disorder  F41.1   3. 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 I provided 30 minutes of nonface-to-face time during this encounter, in which we discussed the anxiety and treatment options as well as reviewing recent labs.  She has been on the Ativan at this dose for a long time, I would recommend changing to Xanax which is double the strength, although the milligrams we will be decreased, and I will add another 1/2 pill in (would be the equivalent of taking Ativan total of 5 mg daily, she is currently on 4 mg dailly.) Continue Lamictal 200 mg 1 nightly. Discontinue Ativan. Start Xanax 1 mg, 1 p.o. twice daily as needed and 1/2 pill mid day as needed.  She can change those dose times around as needed but do not take more than 2.5 pills daily.  She verbalizes understanding.  She knows not to drive until she knows how this is making her feel. Continue Risperdal 4 mg nightly. Return in 6 weeks.  Donnal Moat, PA-C

## 2020-06-29 ENCOUNTER — Telehealth (INDEPENDENT_AMBULATORY_CARE_PROVIDER_SITE_OTHER): Payer: BC Managed Care – PPO | Admitting: Physician Assistant

## 2020-06-29 ENCOUNTER — Encounter: Payer: Self-pay | Admitting: Physician Assistant

## 2020-06-29 DIAGNOSIS — Z720 Tobacco use: Secondary | ICD-10-CM | POA: Diagnosis not present

## 2020-06-29 DIAGNOSIS — F319 Bipolar disorder, unspecified: Secondary | ICD-10-CM

## 2020-06-29 DIAGNOSIS — F411 Generalized anxiety disorder: Secondary | ICD-10-CM | POA: Diagnosis not present

## 2020-06-29 MED ORDER — ALPRAZOLAM 1 MG PO TABS: ORAL_TABLET | ORAL | 5 refills | Status: DC

## 2020-06-29 NOTE — Progress Notes (Signed)
[facility]  Patient ID: [name],  MRN: [id number]  PCP: Patient, No Pcp Per  Date of Evaluation: 06/29/2020 Time spent:30 minutes  Chief Complaint:  Chief Complaint    Anxiety; Depression; Follow-up     Virtual Visit via Telehealth  I connected with patient by video with their informed consent, and verified patient privacy and that I am speaking with the correct person using two identifiers.  I am private, in my office and the patient is at home.  I discussed the limitations, risks, security and privacy concerns of performing an evaluation and management service by video and the availability of in person appointments. I also discussed with the patient that there may be a patient responsible charge related to this service. The patient expressed understanding and agreed to proceed.   I discussed the assessment and treatment plan with the patient. The patient was provided an opportunity to ask questions and all were answered. The patient agreed with the plan and demonstrated an understanding of the instructions.   The patient was advised to call back or seek an in-person evaluation if the symptoms worsen or if the condition fails to improve as anticipated.  I provided 30 minutes of non-face-to-face time during this encounter.  HISTORY/CURRENT STATUS: For 6 month medication check.  At her last visit 2 months ago we changed Ativan to Xanax.  That has worked much better.  Not having panic attacks like she did.  Has usual stressors with a teenager in the house plus people are being laid off at her job, but so far her position is safe.  She is able to enjoy things.  Energy and motivation are good.  She is not crying easily.  Denies feelings of hopelessness, change in memory, or problems with concentration.  Sleeps well.  Still vapes.  She denies suicidal or homicidal thoughts.  Patient denies increased energy with decreased need for sleep, no increased talkativeness, no  racing thoughts, no impulsivity or risky behaviors, no increased spending, no increased libido, no grandiosity, no paranoia, no hallucinations.  Denies dizziness, syncope, seizures, numbness, tingling, tremor, tics, unsteady gait, slurred speech, confusion. Denies muscle or joint pain, stiffness, or dystonia.  No polyuria or polydipsia.  No galactorrhea.  Individual Medical History/ Review of Systems: Changes? :No    Past medications for mental health diagnoses include: Lithium, Wellbutrin was not effective for depression or decreased desire for nicotine, Risperdal, Lamictal, Ativan, Chantix caused paranoia and increased anger  Allergies: Patient has no known allergies.  Current Medications:  Current Outpatient Medications:  .  lamoTRIgine (LAMICTAL) 200 MG tablet, Take 1 tablet (200 mg total) by mouth at bedtime., Disp: 90 tablet, Rfl: 3 .  risperidone (RISPERDAL) 4 MG tablet, Take 1 tablet (4 mg total) by mouth at bedtime., Disp: 90 tablet, Rfl: 3 .  ALPRAZolam (XANAX) 1 MG tablet, 1 po bid prn and may take 1/2 prn during day prn., Disp: 75 tablet, Rfl: 5 Medication Side Effects: none  Family Medical/ Social History: Changes?  No  MENTAL HEALTH EXAM:  There were no vitals taken for this visit.There is no height or weight on file to calculate BMI.  General Appearance: Casual, Neat and Well Groomed  Eye Contact:  Good  Speech:  Clear and Coherent  Volume:  Normal  Mood:  Euthymic  Affect:  Appropriate  Thought Process:  Goal Directed  Orientation:  Full (Time, Place, and Person)  Thought Content: Logical   Suicidal Thoughts:  No  Homicidal Thoughts:  No  Memory:  WNL  Judgement:  Good  Insight:  Good  Psychomotor Activity:  Normal  Concentration:  Concentration: Good  Recall:  Good  Fund of Knowledge: Good  Language: Good  Assets:  Desire for Improvement  ADL's:  Intact  Cognition: WNL  Prognosis:  Good   Most recent pertinent labs at [facility]: 1//02/2021 Glucose  was nl, LFTs and kidney functions nl Total chol 143, LDL 73, HDL 43,  Trig 86  DIAGNOSES:    ICD-10-CM   1. Bipolar I disorder ([place])  F31.9   2. Generalized anxiety disorder  F41.1   3. 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 I provided 30 minutes of nonface-to-face time during this encounter, including time spent before and after the visit in review of records and charting. I am glad to see that the anxiety is better and that Xanax is helping.  No changes in meds are necessary. Briefly discussed cessation of vaping.  Not ready to quit at present. Continue Lamictal 200 mg 1 nightly. Continue Xanax 1 mg, 1/2-1 p.o. twice daily as needed and may take 1/2 pill midday as needed. Continue Risperdal 4 mg nightly. Return in 6 months.  Donnal Moat, PA-C

## 2020-10-22 ENCOUNTER — Telehealth: Payer: BC Managed Care – PPO | Admitting: Physician Assistant

## 2020-12-14 ENCOUNTER — Other Ambulatory Visit: Payer: Self-pay | Admitting: Physician Assistant

## 2020-12-14 NOTE — Telephone Encounter (Signed)
Last filled 8/3 appt on 9/12

## 2020-12-27 ENCOUNTER — Encounter: Payer: Self-pay | Admitting: Physician Assistant

## 2020-12-27 ENCOUNTER — Other Ambulatory Visit: Payer: Self-pay

## 2020-12-27 ENCOUNTER — Ambulatory Visit: Payer: BC Managed Care – PPO | Admitting: Physician Assistant

## 2020-12-27 DIAGNOSIS — F319 Bipolar disorder, unspecified: Secondary | ICD-10-CM

## 2020-12-27 DIAGNOSIS — Z720 Tobacco use: Secondary | ICD-10-CM

## 2020-12-27 DIAGNOSIS — F411 Generalized anxiety disorder: Secondary | ICD-10-CM

## 2020-12-27 DIAGNOSIS — Z566 Other physical and mental strain related to work: Secondary | ICD-10-CM

## 2020-12-27 MED ORDER — ALPRAZOLAM 1 MG PO TABS: 1.0000 mg | ORAL_TABLET | Freq: Three times a day (TID) | ORAL | 1 refills | Status: DC | PRN

## 2020-12-27 MED ORDER — BUSPIRONE HCL 15 MG PO TABS: ORAL_TABLET | ORAL | 1 refills | Status: DC

## 2020-12-27 NOTE — Progress Notes (Signed)
[facility]  Patient ID: [name],  MRN: [id number]  PCP: Patient, No Pcp Per (Inactive)  Date of Evaluation: 12/27/2020 Time spent:30 minutes  Chief Complaint:  Chief Complaint   Anxiety; Depression; Follow-up      HISTORY/CURRENT STATUS: Not doing well.  A lot of work stress. Is trying to change jobs in [place]. Has to take the Xanax before she has to meet with her supervisor. Pt admits to taking more than is the directions on the bottle. "It calms me. I'm sorry." Sleeps well, with the Xanax. Wakes up sometimes but is able to go back to sleep.   If it wasn't for the job stress, she'd be doing good. Has been able to enjoy things, energy and motivation are good, doesn't cry easily but if she wasn't on the xanax she would, not isolating, personal hygiene is normal, not SI/HI.  Does have a lot of irritability and anger. Xanax helps that too. Patient denies increased energy with decreased need for sleep, no increased talkativeness, no racing thoughts, no impulsivity or risky behaviors, no increased spending, no increased libido, no grandiosity, no paranoia, no hallucinations.  Denies dizziness, syncope, seizures, numbness, tingling, tremor, tics, unsteady gait, slurred speech, confusion. Denies muscle or joint pain, stiffness, or dystonia.  No polyuria or polydipsia.  No galactorrhea.  Individual Medical History/ Review of Systems: Changes? :No    Past medications for mental health diagnoses include: Lithium, Wellbutrin was not effective for depression or decreased desire for nicotine, Risperdal, Lamictal, Ativan, Chantix caused paranoia and increased anger  Allergies: Patient has no known allergies.  Current Medications:  Current Outpatient Medications:    busPIRone (BUSPAR) 15 MG tablet, 1/3 po bid for 1 week, then 2/3 po bid for 1 week, then 1 po bid., Disp: 60 tablet, Rfl: 1   lamoTRIgine (LAMICTAL) 200 MG tablet, Take 1 tablet (200 mg total) by mouth at  bedtime., Disp: 90 tablet, Rfl: 3   risperidone (RISPERDAL) 4 MG tablet, Take 1 tablet (4 mg total) by mouth at bedtime., Disp: 90 tablet, Rfl: 3   [START ON 01/07/2021] ALPRAZolam (XANAX) 1 MG tablet, Take 1 tablet (1 mg total) by mouth 3 (three) times daily as needed for anxiety., Disp: 90 tablet, Rfl: 1 Medication Side Effects: none  Family Medical/ Social History: Changes?  No  MENTAL HEALTH EXAM:  There were no vitals taken for this visit.There is no height or weight on file to calculate BMI.  General Appearance: Casual, Neat and Well Groomed  Eye Contact:  Good  Speech:  Clear and Coherent and Normal Rate  Volume:  Normal  Mood:  Anxious and Irritable  Affect:  Anxious  Thought Process:  Goal Directed and Descriptions of Associations: Circumstantial  Orientation:  Full (Time, Place, and Person)  Thought Content: Logical   Suicidal Thoughts:  No  Homicidal Thoughts:  No  Memory:  WNL  Judgement:  Good  Insight:  Good  Psychomotor Activity:  Normal  Concentration:  Concentration: Good and Attention Span: Good  Recall:  Good  Fund of Knowledge: Good  Language: Good  Assets:  Desire for Improvement  ADL's:  Intact  Cognition: WNL  Prognosis:  Good   Most recent pertinent labs at [facility]: 1//02/2021 Glucose was nl, LFTs and kidney functions nl Total chol 143, LDL 73, HDL 43,  Trig 86  DIAGNOSES:    ICD-10-CM   1. Work stress  Z56.6     2. Bipolar I disorder ([place])  F31.9  3. Generalized anxiety disorder  F41.1     4. Vapes nicotine containing substance  Z72.0        Receiving Psychotherapy: No    RECOMMENDATIONS:  PDMP was reviewed.  Last Xanax filled 12/14/2020.  She is getting it 3 to 4 days early each month. I provided 30 minutes of face to face time during this encounter, including time spent before and after the visit in records review, medical decision making, and charting.  Patient confronted about getting the Xanax early.  She admits to taking 3  pills a day sometimes and is only given enough for 2.5 pills/month.  I do think she needs it however and will increase the quantity and frequency of taking it.  She understands she cannot get this early from now on. Discussed the anxiety and prevention.  Recommend BuSpar.  Benefits, risks and side effects were discussed and she accepts. Start BuSpar 15 mg 1/3 tablet twice daily for 1 week, then increase to 2/3 tablet twice daily for 1 week, then increase to 1 tablet twice daily for anxiety. Continue Lamictal 200 mg 1 nightly. Increase Xanax 1 mg to 1 p.o. 3 times daily as needed. Continue Risperdal 4 mg nightly. Return in 4 to 6 weeks.  Donnal Moat, PA-C

## 2021-01-18 ENCOUNTER — Other Ambulatory Visit: Payer: Self-pay | Admitting: Physician Assistant

## 2021-02-07 ENCOUNTER — Encounter: Payer: Self-pay | Admitting: Physician Assistant

## 2021-02-07 ENCOUNTER — Other Ambulatory Visit: Payer: Self-pay

## 2021-02-07 ENCOUNTER — Ambulatory Visit: Payer: BC Managed Care – PPO | Admitting: Physician Assistant

## 2021-02-07 DIAGNOSIS — F319 Bipolar disorder, unspecified: Secondary | ICD-10-CM

## 2021-02-07 DIAGNOSIS — Z566 Other physical and mental strain related to work: Secondary | ICD-10-CM | POA: Diagnosis not present

## 2021-02-07 DIAGNOSIS — F411 Generalized anxiety disorder: Secondary | ICD-10-CM | POA: Diagnosis not present

## 2021-02-07 DIAGNOSIS — Z720 Tobacco use: Secondary | ICD-10-CM

## 2021-02-07 MED ORDER — BUSPIRONE HCL 15 MG PO TABS: ORAL_TABLET | ORAL | 1 refills | Status: DC

## 2021-02-07 NOTE — Progress Notes (Signed)
[facility]  Patient ID: [name],  MRN: [id number]  PCP: Patient, No Pcp Per (Inactive)  Date of Evaluation: 02/07/2021 Time spent:20 minutes  Chief Complaint:  Chief Complaint   Anxiety; Depression; Follow-up     HISTORY/CURRENT STATUS: For routine f/u.  Buspar was added last month. Has only been on the current dose for 3 weeks. Not sure if it's helping or not.  Work has really caused a lot of stress, she obsesses about her boss.  Dreads going into work every day.  Then she cannot stop thinking about her or the situation when she gets home at night.  Not really having panic attacks but more so a generalized sense of doom, mostly when thinking about work and her boss.  She sleeps okay but states she wakes up an hour early almost every morning when she does not have to.  And then on weekends she does not sleep been anymore because she just wakes up.  She is not sure if that is normal or not.  Patient denies loss of interest in usual activities and is able to enjoy things.  Denies decreased energy or motivation.  Appetite has not changed.  No extreme sadness, tearfulness, or feelings of hopelessness.  Denies any changes in concentration, making decisions or remembering things.  Denies suicidal or homicidal thoughts.  Patient denies increased energy with decreased need for sleep, no increased talkativeness, no racing thoughts, no impulsivity or risky behaviors, no increased spending, no increased libido, no grandiosity, no increased irritability or anger, and no hallucinations.  Denies dizziness, syncope, seizures, numbness, tingling, tremor, tics, unsteady gait, slurred speech, confusion. Denies muscle or joint pain, stiffness, or dystonia. Denies unexplained weight loss, frequent infections, or sores that heal slowly.  No polyphagia, polydipsia, or polyuria. Denies visual changes or paresthesias.   Individual Medical History/ Review of Systems: Changes? :No    Past  medications for mental health diagnoses include: Lithium, Wellbutrin was not effective for depression or decreased desire for nicotine, Risperdal, Lamictal, Ativan, Chantix caused paranoia and increased anger  Allergies: Patient has no known allergies.  Current Medications:  Current Outpatient Medications:    ALPRAZolam (XANAX) 1 MG tablet, Take 1 tablet (1 mg total) by mouth 3 (three) times daily as needed for anxiety., Disp: 90 tablet, Rfl: 1   lamoTRIgine (LAMICTAL) 200 MG tablet, Take 1 tablet (200 mg total) by mouth at bedtime., Disp: 90 tablet, Rfl: 3   risperidone (RISPERDAL) 4 MG tablet, Take 1 tablet (4 mg total) by mouth at bedtime., Disp: 90 tablet, Rfl: 3   busPIRone (BUSPAR) 15 MG tablet, 2 po q am, 1 po q evening., Disp: 90 tablet, Rfl: 1 Medication Side Effects: none  Family Medical/ Social History: Changes?  No  MENTAL HEALTH EXAM:  There were no vitals taken for this visit.There is no height or weight on file to calculate BMI.  General Appearance: Casual, Neat and Well Groomed  Eye Contact:  Good  Speech:  Clear and Coherent and Normal Rate  Volume:  Normal  Mood:  Anxious  Affect:  Anxious  Thought Process:  Goal Directed and Descriptions of Associations: Circumstantial  Orientation:  Full (Time, Place, and Person)  Thought Content: Logical   Suicidal Thoughts:  No  Homicidal Thoughts:  No  Memory:  WNL  Judgement:  Good  Insight:  Good  Psychomotor Activity:  Normal  Concentration:  Concentration: Good and Attention Span: Good  Recall:  Good  Fund of Knowledge: Good  Language: Good  Assets:  Desire for Improvement  ADL's:  Intact  Cognition: WNL  Prognosis:  Good   Most recent pertinent labs at [facility]: 1//02/2021 Glucose was nl, LFTs and kidney functions nl Total chol 143, LDL 73, HDL 43,  Trig 86  DIAGNOSES:    ICD-10-CM   1. Generalized anxiety disorder  F41.1     2. Bipolar I disorder ([place])  F31.9     3. Work stress  Z56.6     4. Vapes  nicotine containing substance  Z72.0         Receiving Psychotherapy: No    RECOMMENDATIONS:  PDMP was reviewed.  Last Xanax filled 02/05/2021, 01/07/2021, 12/14/2020, 11/17/2020.  (We had discussed her getting early refills at her last appointment.) I provided 20 minutes of face to face time during this encounter, including time spent before and after the visit in records review, medical decision making, and charting.  We discussed the anxiety and I recommend increasing the BuSpar dose further.  It has been making her a little dizzy so I reminded her to take it with food. Discussed vaping cessation. Increase Buspar 15 mg to 2 p.o. every morning and 1 p.o. q. evening.  Take with food. Continue Lamictal 200 mg 1 nightly. Increase Xanax 1 mg to 1 p.o. 3 times daily as needed. Continue Risperdal 4 mg nightly. Strongly recommend counseling. Return in 4 to 6 weeks.  Donnal Moat, PA-C

## 2021-03-06 ENCOUNTER — Other Ambulatory Visit: Payer: Self-pay | Admitting: Physician Assistant

## 2021-03-15 ENCOUNTER — Other Ambulatory Visit: Payer: Self-pay | Admitting: Physician Assistant

## 2021-03-15 NOTE — Telephone Encounter (Signed)
90 day ok?

## 2021-03-18 ENCOUNTER — Telehealth: Payer: BC Managed Care – PPO | Admitting: Physician Assistant

## 2021-03-21 ENCOUNTER — Other Ambulatory Visit: Payer: Self-pay

## 2021-03-21 ENCOUNTER — Ambulatory Visit: Payer: BC Managed Care – PPO | Admitting: Physician Assistant

## 2021-03-21 DIAGNOSIS — F411 Generalized anxiety disorder: Secondary | ICD-10-CM

## 2021-03-21 DIAGNOSIS — F319 Bipolar disorder, unspecified: Secondary | ICD-10-CM | POA: Diagnosis not present

## 2021-03-21 DIAGNOSIS — Z720 Tobacco use: Secondary | ICD-10-CM | POA: Diagnosis not present

## 2021-03-21 DIAGNOSIS — Z566 Other physical and mental strain related to work: Secondary | ICD-10-CM

## 2021-03-21 MED ORDER — ALPRAZOLAM 1 MG PO TABS: ORAL_TABLET | ORAL | 2 refills | Status: DC

## 2021-03-21 NOTE — Progress Notes (Signed)
[facility]  Patient ID: [name],  MRN: [id number]  PCP: [name], MD  Date of Evaluation: 03/21/2021 Time spent:30 minutes  Chief Complaint:  Chief Complaint   Anxiety; Depression      HISTORY/CURRENT STATUS: For routine med check.  Very stressed. Might be losing her job. In some ways it's a good thing, her boss is not good. Is taking a lot of vacation days b/c she gets more anxiety and depression just thinking about going in. Is missing around 4-5 days per month. Her boss is condescending.  Has been told she has an appointment with HR coming up soon and thinks she might be getting fired.  States it might be a good thing because she will get severance pay.  She has been at this job for 12 years though and would like to stay but not with that boss.  She is able to enjoy things.  Energy and motivation are good.  Does get depressed when thinking about this situation but otherwise is okay.  Personal hygiene is fair to good depending on the day.  Appetite is normal and weight is stable.  She is sleeping well.  Still gets very anxious, Xanax does help though and needs it 3 times daily.  Not having panic attacks but just feels like she cannot face going into work unless she takes the Xanax.  Then she obsesses about things.  No suicidal or homicidal thoughts.  Patient denies increased energy with decreased need for sleep, no increased talkativeness, no racing thoughts, no impulsivity or risky behaviors, no increased spending, no increased libido, no grandiosity, no increased irritability or anger, and no hallucinations.  Denies dizziness, syncope, seizures, numbness, tingling, tremor, tics, unsteady gait, slurred speech, confusion. Denies muscle or joint pain, stiffness, or dystonia. Denies unexplained weight loss, frequent infections, or sores that heal slowly.  No polyphagia, polydipsia, or polyuria. Denies visual changes or paresthesias.   Individual Medical History/  Review of Systems: Changes? :No    Past medications for mental health diagnoses include: Lithium, Wellbutrin was not effective for depression or decreased desire for nicotine, Risperdal, Lamictal, Ativan, Chantix caused paranoia and increased anger  Allergies: Patient has no known allergies.  Current Medications:  Current Outpatient Medications:    busPIRone (BUSPAR) 15 MG tablet, TAKE 2 TABLETS BY MOUTH EVERY MORNING AND 1 TABLET BY MOUTH EVERY EVENING, Disp: 270 tablet, Rfl: 0   lamoTRIgine (LAMICTAL) 200 MG tablet, Take 1 tablet (200 mg total) by mouth at bedtime., Disp: 90 tablet, Rfl: 3   risperidone (RISPERDAL) 4 MG tablet, Take 1 tablet (4 mg total) by mouth at bedtime., Disp: 90 tablet, Rfl: 3   ALPRAZolam (XANAX) 1 MG tablet, One po tid prn., Disp: 90 tablet, Rfl: 2 Medication Side Effects: none  Family Medical/ Social History: Changes?  No  MENTAL HEALTH EXAM:  There were no vitals taken for this visit.There is no height or weight on file to calculate BMI.  General Appearance: Casual, Neat and Well Groomed  Eye Contact:  Good  Speech:  Clear and Coherent and Normal Rate  Volume:  Normal  Mood:  Anxious  Affect:  Congruent  Thought Process:  Goal Directed and Descriptions of Associations: Circumstantial  Orientation:  Full (Time, Place, and Person)  Thought Content: Logical   Suicidal Thoughts:  No  Homicidal Thoughts:  No  Memory:  WNL  Judgement:  Good  Insight:  Good  Psychomotor Activity:  Normal  Concentration:  Concentration: Good and Attention Span: Good  Recall:  Good  Fund of Knowledge: Good  Language: Good  Assets:  Desire for Improvement  ADL's:  Intact  Cognition: WNL  Prognosis:  Good   Most recent pertinent labs at [facility]: 1//02/2021 Glucose was nl, LFTs and kidney functions nl Total chol 143, LDL 73, HDL 43,  Trig 86  DIAGNOSES:    ICD-10-CM   1. Generalized anxiety disorder  F41.1     2. Bipolar I disorder ([place])  F31.9     3. Work  stress  Z56.6     4. Vapes nicotine containing substance  Z72.0          Receiving Psychotherapy: No    RECOMMENDATIONS:  PDMP was reviewed.  Last Xanax 03/07/2021. I provided 30 minutes of face to face time during this encounter, including time spent before and after the visit in records review, medical decision making, counseling pertinent to today's visit, and charting.  She has improved since increasing the BuSpar and the Xanax a few months ago.  She is handling things better than she would have before those changes were made.  At this point we will leave things the same.  I do recommend FMLA to help protect her job if they do not "lay her off" because all of the stress is not helpful for her mental health. Discussed vaping cessation. Continue Buspar 15 mg to 2 p.o. every morning and 1 p.o. q. evening.  Continue Lamictal 200 mg 1 nightly. Continue Xanax 1 mg to 1 p.o. 3 times daily as needed. Continue Risperdal 4 mg nightly. FMLA 4 days per month.  Return in 3 months.  Donnal Moat, PA-C

## 2021-03-23 ENCOUNTER — Telehealth: Payer: Self-pay | Admitting: Physician Assistant

## 2021-03-23 NOTE — Telephone Encounter (Signed)
Received FMLA form. Placed in Traci's box 12/7

## 2021-03-28 NOTE — Telephone Encounter (Signed)
Forms completed and given to Helene Kelp to sign when she gets back in the office on Tuesday 12/13

## 2021-03-29 ENCOUNTER — Telehealth: Payer: Self-pay | Admitting: Physician Assistant

## 2021-03-29 DIAGNOSIS — Z0289 Encounter for other administrative examinations: Secondary | ICD-10-CM

## 2021-03-29 NOTE — Telephone Encounter (Signed)
Pt LVM asking for Teresa's nurse to call her back.   Next appt 1/16

## 2021-03-29 NOTE — Telephone Encounter (Signed)
LVM to rtc 

## 2021-03-29 NOTE — Telephone Encounter (Signed)
She was probably asking about her FMLA paperwork. You can let her know Helene Kelp was able to sign today and it's been faxed. Thanks

## 2021-04-01 NOTE — Telephone Encounter (Signed)
Forms where faxed on 03/31/2021 after Helene Kelp returned to office

## 2021-04-11 ENCOUNTER — Other Ambulatory Visit: Payer: Self-pay | Admitting: Physician Assistant

## 2021-04-15 ENCOUNTER — Other Ambulatory Visit: Payer: Self-pay | Admitting: Physician Assistant

## 2021-04-19 ENCOUNTER — Emergency Department (HOSPITAL_COMMUNITY): Payer: BC Managed Care – PPO

## 2021-04-19 ENCOUNTER — Inpatient Hospital Stay (HOSPITAL_COMMUNITY)
Admission: EM | Admit: 2021-04-19 | Discharge: 2021-04-24 | DRG: 060 | Disposition: A | Discharge status: Home / Self Care | Payer: BC Managed Care – PPO | Attending: Internal Medicine | Admitting: Internal Medicine

## 2021-04-19 ENCOUNTER — Encounter (HOSPITAL_COMMUNITY): Payer: Self-pay | Admitting: Emergency Medicine

## 2021-04-19 ENCOUNTER — Other Ambulatory Visit: Payer: Self-pay

## 2021-04-19 DIAGNOSIS — F411 Generalized anxiety disorder: Secondary | ICD-10-CM | POA: Diagnosis not present

## 2021-04-19 DIAGNOSIS — R531 Weakness: Secondary | ICD-10-CM | POA: Diagnosis not present

## 2021-04-19 DIAGNOSIS — G8194 Hemiplegia, unspecified affecting left nondominant side: Secondary | ICD-10-CM | POA: Diagnosis not present

## 2021-04-19 DIAGNOSIS — G35 Multiple sclerosis: Principal | ICD-10-CM | POA: Diagnosis present

## 2021-04-19 DIAGNOSIS — F319 Bipolar disorder, unspecified: Secondary | ICD-10-CM | POA: Diagnosis present

## 2021-04-19 DIAGNOSIS — E559 Vitamin D deficiency, unspecified: Secondary | ICD-10-CM | POA: Diagnosis not present

## 2021-04-19 DIAGNOSIS — G9389 Other specified disorders of brain: Secondary | ICD-10-CM | POA: Diagnosis not present

## 2021-04-19 DIAGNOSIS — G379 Demyelinating disease of central nervous system, unspecified: Principal | ICD-10-CM

## 2021-04-19 DIAGNOSIS — Z79899 Other long term (current) drug therapy: Secondary | ICD-10-CM | POA: Diagnosis not present

## 2021-04-19 DIAGNOSIS — Z20822 Contact with and (suspected) exposure to covid-19: Secondary | ICD-10-CM | POA: Diagnosis present

## 2021-04-19 DIAGNOSIS — R296 Repeated falls: Secondary | ICD-10-CM | POA: Diagnosis present

## 2021-04-19 DIAGNOSIS — E041 Nontoxic single thyroid nodule: Secondary | ICD-10-CM | POA: Diagnosis not present

## 2021-04-19 DIAGNOSIS — M47814 Spondylosis without myelopathy or radiculopathy, thoracic region: Secondary | ICD-10-CM | POA: Diagnosis not present

## 2021-04-19 DIAGNOSIS — R9389 Abnormal findings on diagnostic imaging of other specified body structures: Secondary | ICD-10-CM | POA: Diagnosis not present

## 2021-04-19 DIAGNOSIS — R9082 White matter disease, unspecified: Secondary | ICD-10-CM | POA: Diagnosis not present

## 2021-04-19 DIAGNOSIS — I10 Essential (primary) hypertension: Secondary | ICD-10-CM | POA: Diagnosis not present

## 2021-04-19 DIAGNOSIS — Z8619 Personal history of other infectious and parasitic diseases: Secondary | ICD-10-CM | POA: Diagnosis not present

## 2021-04-19 DIAGNOSIS — R270 Ataxia, unspecified: Secondary | ICD-10-CM | POA: Diagnosis not present

## 2021-04-19 DIAGNOSIS — Q283 Other malformations of cerebral vessels: Secondary | ICD-10-CM | POA: Diagnosis not present

## 2021-04-19 DIAGNOSIS — Z801 Family history of malignant neoplasm of trachea, bronchus and lung: Secondary | ICD-10-CM

## 2021-04-19 DIAGNOSIS — R29818 Other symptoms and signs involving the nervous system: Secondary | ICD-10-CM | POA: Diagnosis not present

## 2021-04-19 DIAGNOSIS — K5900 Constipation, unspecified: Secondary | ICD-10-CM | POA: Diagnosis not present

## 2021-04-19 DIAGNOSIS — M47812 Spondylosis without myelopathy or radiculopathy, cervical region: Secondary | ICD-10-CM | POA: Diagnosis not present

## 2021-04-19 DIAGNOSIS — F17290 Nicotine dependence, other tobacco product, uncomplicated: Secondary | ICD-10-CM | POA: Diagnosis not present

## 2021-04-19 HISTORY — DX: Bipolar disorder, unspecified: F31.9

## 2021-04-19 HISTORY — DX: Anxiety disorder, unspecified: F41.9

## 2021-04-19 LAB — RAPID URINE DRUG SCREEN, HOSP PERFORMED
Amphetamines: NOT DETECTED
Barbiturates: NOT DETECTED
Benzodiazepines: POSITIVE — AB
Cocaine: NOT DETECTED
Opiates: NOT DETECTED
Tetrahydrocannabinol: NOT DETECTED

## 2021-04-19 LAB — COMPREHENSIVE METABOLIC PANEL
ALT: 18 U/L (ref 0–44)
AST: 23 U/L (ref 15–41)
Albumin: 4.2 g/dL (ref 3.5–5.0)
Alkaline Phosphatase: 76 U/L (ref 38–126)
Anion gap: 5 (ref 5–15)
BUN: 8 mg/dL (ref 6–20)
CO2: 24 mmol/L (ref 22–32)
Calcium: 10.3 mg/dL (ref 8.9–10.3)
Chloride: 107 mmol/L (ref 98–111)
Creatinine, Ser: 0.83 mg/dL (ref 0.44–1.00)
GFR, Estimated: >60 mL/min (ref >60)
Glucose, Bld: 82 mg/dL (ref 70–99)
Potassium: 6.4 mmol/L (ref 3.5–5.1)
Sodium: 136 mmol/L (ref 135–145)
Total Bilirubin: 1 mg/dL (ref 0.3–1.2)
Total Protein: 7.4 g/dL (ref 6.5–8.1)

## 2021-04-19 LAB — PROTIME-INR
INR: 0.9 (ref 0.8–1.2)
Prothrombin Time: 12.5 seconds (ref 11.4–15.2)

## 2021-04-19 LAB — DIFFERENTIAL
Abs Immature Granulocytes: 0.03 10*3/uL (ref 0.00–0.07)
Basophils Absolute: 0 10*3/uL (ref 0.0–0.1)
Basophils Relative: 1 %
Eosinophils Absolute: 0 10*3/uL (ref 0.0–0.5)
Eosinophils Relative: 1 %
Immature Granulocytes: 0 %
Lymphocytes Relative: 18 %
Lymphs Abs: 1.4 10*3/uL (ref 0.7–4.0)
Monocytes Absolute: 0.4 10*3/uL (ref 0.1–1.0)
Monocytes Relative: 5 %
Neutro Abs: 5.9 10*3/uL (ref 1.7–7.7)
Neutrophils Relative %: 75 %

## 2021-04-19 LAB — CBC
HCT: 41.7 % (ref 36.0–46.0)
Hemoglobin: 14.5 g/dL (ref 12.0–15.0)
MCH: 32.9 pg (ref 26.0–34.0)
MCHC: 34.8 g/dL (ref 30.0–36.0)
MCV: 94.6 fL (ref 80.0–100.0)
Platelets: 250 10*3/uL (ref 150–400)
RBC: 4.41 MIL/uL (ref 3.87–5.11)
RDW: 12.6 % (ref 11.5–15.5)
WBC: 7.8 10*3/uL (ref 4.0–10.5)
nRBC: 0 % (ref 0.0–0.2)

## 2021-04-19 LAB — APTT: aPTT: 27 seconds (ref 24–36)

## 2021-04-19 LAB — RESP PANEL BY RT-PCR (FLU A&B, COVID) ARPGX2
Influenza A by PCR: NEGATIVE
Influenza B by PCR: NEGATIVE
SARS Coronavirus 2 by RT PCR: NEGATIVE

## 2021-04-19 LAB — POTASSIUM: Potassium: 4.7 mmol/L (ref 3.5–5.1)

## 2021-04-19 MED ORDER — IOHEXOL 350 MG/ML SOLN
75.0000 mL | Freq: Once | INTRAVENOUS | Status: AC | PRN
Start: 2021-04-19 — End: 2021-04-19
  Administered 2021-04-19: 75 mL via INTRAVENOUS

## 2021-04-19 NOTE — ED Provider Notes (Addendum)
Emergency Medicine Provider Triage Evaluation Note  [name] , a 46 y.o. female  was evaluated in triage.  Pt complains of presents to the emergency department for weakness.  Patient had onset of weakness on the left side and paresthesia.  She has been falling.  She has some increased neck pain.  She has been using a cane.  This is all new since Wednesday of this past week  Review of Systems  Positive: Unilateral weakness and paresthesia Negative: Vision change  Physical Exam  BP 119/86 (BP Location: Right Arm)    Pulse 90    Temp 99 F (37.2 C) (Oral)    Resp 18    SpO2 98%  Gen:   Awake, no distress   Resp:  Normal effort  MSK:   Moves extremities without difficulty  Other:  Weakness left upper and lower extremity  Medical Decision Making  Medically screening exam initiated at 3:06 PM.  Appropriate orders placed.  April Colter was informed that the remainder of the evaluation will be completed by another provider, this initial triage assessment does not replace that evaluation, and the importance of remaining in the ED until their evaluation is complete.  Stroke work-up initiated   Margarita Mail, Hershal Coria 04/19/21 1509   5:13 PM Informed by labs of elevated potassium of 6.4.  No elevation in patient's creatinine or BMP likely due to hemolysis.  I have ordered a repeat potassium level.   Margarita Mail, PA-C 48/18/56 [id number]    Lianne Cure, DO 70/26/37 [id number]

## 2021-04-19 NOTE — ED Triage Notes (Addendum)
Pt reports neck pain, back pain, frequent falls and unsteady gait since Wednesday.  Reports L sided arm pain and numbness and L leg numbness since Wednesday that is progressively worse this morning when she went to work.  No arm drift.

## 2021-04-20 ENCOUNTER — Emergency Department (HOSPITAL_COMMUNITY): Payer: BC Managed Care – PPO

## 2021-04-20 DIAGNOSIS — E559 Vitamin D deficiency, unspecified: Secondary | ICD-10-CM | POA: Diagnosis present

## 2021-04-20 DIAGNOSIS — G379 Demyelinating disease of central nervous system, unspecified: Secondary | ICD-10-CM | POA: Diagnosis not present

## 2021-04-20 DIAGNOSIS — R270 Ataxia, unspecified: Secondary | ICD-10-CM | POA: Diagnosis present

## 2021-04-20 DIAGNOSIS — R296 Repeated falls: Secondary | ICD-10-CM | POA: Diagnosis present

## 2021-04-20 DIAGNOSIS — E041 Nontoxic single thyroid nodule: Secondary | ICD-10-CM | POA: Diagnosis present

## 2021-04-20 DIAGNOSIS — Z8619 Personal history of other infectious and parasitic diseases: Secondary | ICD-10-CM | POA: Diagnosis not present

## 2021-04-20 DIAGNOSIS — G35 Multiple sclerosis: Secondary | ICD-10-CM | POA: Diagnosis present

## 2021-04-20 DIAGNOSIS — R9389 Abnormal findings on diagnostic imaging of other specified body structures: Secondary | ICD-10-CM | POA: Diagnosis not present

## 2021-04-20 DIAGNOSIS — Z801 Family history of malignant neoplasm of trachea, bronchus and lung: Secondary | ICD-10-CM | POA: Diagnosis not present

## 2021-04-20 DIAGNOSIS — F411 Generalized anxiety disorder: Secondary | ICD-10-CM | POA: Diagnosis present

## 2021-04-20 DIAGNOSIS — K5900 Constipation, unspecified: Secondary | ICD-10-CM | POA: Diagnosis present

## 2021-04-20 DIAGNOSIS — Z79899 Other long term (current) drug therapy: Secondary | ICD-10-CM | POA: Diagnosis not present

## 2021-04-20 DIAGNOSIS — F17290 Nicotine dependence, other tobacco product, uncomplicated: Secondary | ICD-10-CM | POA: Diagnosis present

## 2021-04-20 DIAGNOSIS — Z20822 Contact with and (suspected) exposure to covid-19: Secondary | ICD-10-CM | POA: Diagnosis present

## 2021-04-20 DIAGNOSIS — F319 Bipolar disorder, unspecified: Secondary | ICD-10-CM | POA: Diagnosis present

## 2021-04-20 MED ORDER — NICOTINE 7 MG/24HR TD PT24
7.0000 mg | MEDICATED_PATCH | Freq: Every day | TRANSDERMAL | Status: DC
  Administered 2021-04-20 – 2021-04-24 (×5): 7 mg via TRANSDERMAL
  Filled 2021-04-20 (×5): qty 1

## 2021-04-20 MED ORDER — RISPERIDONE 2 MG PO TABS
4.0000 mg | ORAL_TABLET | Freq: Every day | ORAL | Status: DC
  Administered 2021-04-20 – 2021-04-23 (×4): 4 mg via ORAL
  Filled 2021-04-20 (×5): qty 2

## 2021-04-20 MED ORDER — DIAZEPAM 5 MG/ML IJ SOLN
INTRAMUSCULAR | Status: AC
  Filled 2021-04-20: qty 2

## 2021-04-20 MED ORDER — SODIUM CHLORIDE 0.9 % IV SOLN
1000.0000 mg | INTRAVENOUS | Status: AC
  Administered 2021-04-20 – 2021-04-24 (×5): 1000 mg via INTRAVENOUS
  Filled 2021-04-20 (×6): qty 16

## 2021-04-20 MED ORDER — ACETAMINOPHEN 650 MG RE SUPP: 650.0000 mg | Freq: Four times a day (QID) | RECTAL | Status: DC | PRN

## 2021-04-20 MED ORDER — ONDANSETRON HCL 4 MG/2ML IJ SOLN: 4.0000 mg | Freq: Four times a day (QID) | INTRAMUSCULAR | Status: DC | PRN

## 2021-04-20 MED ORDER — LAMOTRIGINE 100 MG PO TABS
200.0000 mg | ORAL_TABLET | Freq: Every day | ORAL | Status: DC
  Administered 2021-04-20 – 2021-04-23 (×4): 200 mg via ORAL
  Filled 2021-04-20 (×4): qty 2

## 2021-04-20 MED ORDER — PANTOPRAZOLE SODIUM 40 MG IV SOLR
40.0000 mg | Freq: Every day | INTRAVENOUS | Status: AC
  Administered 2021-04-21 – 2021-04-24 (×4): 40 mg via INTRAVENOUS
  Filled 2021-04-20 (×4): qty 40

## 2021-04-20 MED ORDER — GADOBUTROL 1 MMOL/ML IV SOLN
9.0000 mL | Freq: Once | INTRAVENOUS | Status: AC | PRN
  Administered 2021-04-20: 9 mL via INTRAVENOUS

## 2021-04-20 MED ORDER — PANTOPRAZOLE SODIUM 40 MG IV SOLR
40.0000 mg | Freq: Once | INTRAVENOUS | Status: AC
  Administered 2021-04-20: 40 mg via INTRAVENOUS
  Filled 2021-04-20: qty 40

## 2021-04-20 MED ORDER — ONDANSETRON HCL 4 MG PO TABS: 4.0000 mg | ORAL_TABLET | Freq: Four times a day (QID) | ORAL | Status: DC | PRN

## 2021-04-20 MED ORDER — DIAZEPAM 5 MG/ML IJ SOLN
2.5000 mg | Freq: Once | INTRAMUSCULAR | Status: AC
  Administered 2021-04-20: 2.5 mg via INTRAVENOUS

## 2021-04-20 MED ORDER — ALPRAZOLAM 0.5 MG PO TABS
1.0000 mg | ORAL_TABLET | Freq: Three times a day (TID) | ORAL | Status: DC | PRN
  Administered 2021-04-20 – 2021-04-24 (×7): 1 mg via ORAL
  Filled 2021-04-20 (×2): qty 2
  Filled 2021-04-20: qty 4
  Filled 2021-04-20 (×4): qty 2

## 2021-04-20 MED ORDER — SODIUM CHLORIDE 0.9% FLUSH
3.0000 mL | Freq: Two times a day (BID) | INTRAVENOUS | Status: DC
  Administered 2021-04-20 – 2021-04-24 (×9): 3 mL via INTRAVENOUS

## 2021-04-20 MED ORDER — SENNOSIDES-DOCUSATE SODIUM 8.6-50 MG PO TABS: 1.0000 | ORAL_TABLET | Freq: Every evening | ORAL | Status: DC | PRN

## 2021-04-20 MED ORDER — ACETAMINOPHEN 325 MG PO TABS
650.0000 mg | ORAL_TABLET | Freq: Four times a day (QID) | ORAL | Status: DC | PRN
  Administered 2021-04-21 – 2021-04-22 (×5): 650 mg via ORAL
  Filled 2021-04-20 (×7): qty 2

## 2021-04-20 MED ORDER — RIVAROXABAN 10 MG PO TABS
10.0000 mg | ORAL_TABLET | Freq: Every day | ORAL | Status: DC
  Administered 2021-04-20 – 2021-04-24 (×5): 10 mg via ORAL
  Filled 2021-04-20 (×5): qty 1

## 2021-04-20 MED ORDER — BUSPIRONE HCL 10 MG PO TABS
15.0000 mg | ORAL_TABLET | Freq: Three times a day (TID) | ORAL | Status: DC
  Administered 2021-04-20 – 2021-04-24 (×13): 15 mg via ORAL
  Filled 2021-04-20 (×13): qty 2

## 2021-04-20 NOTE — H&P (Addendum)
Date: 04/20/2021               Patient Name:  [name] MRN: [id number]  DOB: 10-07-1975 Age / Sex: 46 y.o., female   PCP: [name], MD         Medical Service: Internal Medicine Teaching Service         Attending Physician: Dr. Velna Ochs, MD    First Contact: Christiana Fuchs, DO Pager: KM [phone]  Second Contact: Tamsen Snider, MD Pager: Cherly Hensen [phone]       After Hours (After 5p/  First Contact Pager: [phone]  weekends / holidays): Second Contact Pager: [phone]   SUBJECTIVE  Chief Complaint: ataxia  History of Present Illness: [name] is a 46 y.o. female with a pertinent PMH of bipolar I disorder and generalized anxiety disorder, who presents to [facility] with difficulty walking for one week.  Ms [name] reports that she was in her usual state of health until approximately one week ago when she noted sudden onset bilateral lower extremity weakness and trouble walking while at the mall with her children. She notes that this has since progressively worsened and has to often hold onto the wall when walking for stability and has started using a cane yesterday. She has had associated left leg numbness. She does note multiple falls; however, denies any head or neck trauma. She has not had any prior similar symptoms. Of note, she also endorses neck pain since yesterday. She denies any headaches, vision changes, fevers/chills, nausea/vomiting, chest pain or palpitations, or recent symptoms of URI or gastritis.    Medications: No current facility-administered medications on file prior to encounter.   Current Outpatient Medications on File Prior to Encounter  Medication Sig Dispense Refill   ALPRAZolam (XANAX) 1 MG tablet One po tid prn. (Patient taking differently: Take 1 mg by mouth 3 (three) times daily. One po tid prn.) 90 tablet 2   busPIRone (BUSPAR) 15 MG tablet TAKE 2 TABLETS BY MOUTH EVERY MORNING AND 1 TABLET BY MOUTH EVERY EVENING (Patient taking differently: Take  15 mg by mouth 3 (three) times daily. TAKE 2 TABLETS BY MOUTH EVERY MORNING AND 1 TABLET BY MOUTH EVERY EVENING) 270 tablet 0   ibuprofen (ADVIL) 200 MG tablet Take 200 mg by mouth every 6 (six) hours as needed for headache or mild pain.     lamoTRIgine (LAMICTAL) 200 MG tablet TAKE 1 TABLET AT BEDTIME (Patient taking differently: Take 200 mg by mouth daily.) 90 tablet 0   risperidone (RISPERDAL) 4 MG tablet TAKE 1 TABLET AT BEDTIME (Patient taking differently: Take 4 mg by mouth daily.) 90 tablet 0    Past Medical History:  Past Medical History:  Diagnosis Date   Anxiety    Bipolar 1 disorder ([place])     Social:  Patient lives at home with her husband and two teenage daughters, aged 9 and 47. She works as a Designer, jewellery at [employer]. She does note that she has been under more stress recently at work. She has an extensive tobacco use history - started smoking at age 2; up to 1.5ppd. Over the past 8 years, has been vaping daily, up to 3g of tobacco per day. Denies any alcohol or illicit drug use.   Family History: Grandfather - lung cancer    Allergies: Allergies as of 04/19/2021   (No Known Allergies)    Review of Systems: A complete ROS was negative except as per HPI.   OBJECTIVE:  Physical Exam:  Blood pressure 105/68, pulse 81, temperature 99.1 F (37.3 C), temperature source Oral, resp. rate 18, SpO2 98 %. Physical Exam  Constitutional: Appears well-developed and well-nourished. No distress.  HENT: Normocephalic and atraumatic, EOMI, conjunctiva normal, moist mucous membranes Cardiovascular: Normal rate, regular rhythm, S1 and S2 present, no murmurs, rubs, gallops.  Distal pulses intact Respiratory: No respiratory distress, Lungs are clear to auscultation bilaterally. GI: Nondistended, soft, nontender to palpation, normal bowel sounds Musculoskeletal: Normal bulk and tone.  No peripheral edema noted. Neurological: Is alert and oriented x4, CN II-XII grossly intact.  Strength 5/5 in BLE and sensation to light touch grossly intact. Strength 3/5 in BLE, sensation to light touch grossly intact. Finger-to-nose delayed on left. Heel-to-shin with ataxia in BLE Skin: Warm and dry.  No rash, erythema, lesions noted. Psychiatric: Normal mood and affect.   Pertinent Labs: CBC    Component Value Date/Time   WBC 7.8 04/19/2021 1535   RBC 4.41 04/19/2021 1535   HGB 14.5 04/19/2021 1535   HCT 41.7 04/19/2021 1535   PLT 250 04/19/2021 1535   MCV 94.6 04/19/2021 1535   MCH 32.9 04/19/2021 1535   MCHC 34.8 04/19/2021 1535   RDW 12.6 04/19/2021 1535   LYMPHSABS 1.4 04/19/2021 1535   MONOABS 0.4 04/19/2021 1535   EOSABS 0.0 04/19/2021 1535   BASOSABS 0.0 04/19/2021 1535     CMP     Component Value Date/Time   NA 136 04/19/2021 1535   K 4.7 04/19/2021 2146   CL 107 04/19/2021 1535   CO2 24 04/19/2021 1535   GLUCOSE 82 04/19/2021 1535   BUN 8 04/19/2021 1535   CREATININE 0.83 04/19/2021 1535   CALCIUM 10.3 04/19/2021 1535   PROT 7.4 04/19/2021 1535   ALBUMIN 4.2 04/19/2021 1535   AST 23 04/19/2021 1535   ALT 18 04/19/2021 1535   ALKPHOS 76 04/19/2021 1535   BILITOT 1.0 04/19/2021 1535   GFRNONAA >60 04/19/2021 1535   GFRAA >90 12/27/2013 1827    Pertinent Imaging: CT ANGIO HEAD NECK W WO CM  Result Date: 04/19/2021 CLINICAL DATA:  Initial evaluation for neuro deficit, stroke suspected. EXAM: CT ANGIOGRAPHY HEAD AND NECK TECHNIQUE: Multidetector CT imaging of the head and neck was performed using the standard protocol during bolus administration of intravenous contrast. Multiplanar CT image reconstructions and MIPs were obtained to evaluate the vascular anatomy. Carotid stenosis measurements (when applicable) are obtained utilizing NASCET criteria, using the distal internal carotid diameter as the denominator. CONTRAST:  57mL OMNIPAQUE IOHEXOL 350 MG/ML SOLN COMPARISON:  MRI from earlier the same day. FINDINGS: CT HEAD FINDINGS Brain: Cerebral volume  within normal limits. Cerebral white matter disease better characterized on prior MRI. No acute intracranial hemorrhage. No acute large vessel territory infarct. No mass lesion, midline shift or mass effect. No hydrocephalus or extra-axial fluid collection. Vascular: No hyperdense vessel. Skull: Scalp soft tissues and calvarium within normal limits. Sinuses: Paranasal sinuses and mastoid air cells are clear. Orbits: Globes normal soft tissues demonstrate no acute finding. Review of the MIP images confirms the above findings CTA NECK FINDINGS Aortic arch: Visualized aortic arch normal caliber with normal branch pattern. No stenosis about the origin the great vessels. Right carotid system: Right common and internal carotid arteries widely patent without stenosis, dissection or occlusion. Left carotid system: Left common and internal carotid arteries widely patent without stenosis, dissection or occlusion. Vertebral arteries: Both vertebral arteries arise from the subclavian arteries. No proximal subclavian artery stenosis. Both vertebral arteries widely patent without stenosis,  dissection or occlusion. Skeleton: Mild spondylosis present at C6-7. Other neck: 1.9 cm nodule seen extending from the lower pole the left thyroid lobe (series 9, image 144). Upper chest: Small layering left pleural effusion partially visualized. Visualized upper chest demonstrates no other acute finding. Review of the MIP images confirms the above findings CTA HEAD FINDINGS Anterior circulation: Both internal carotid arteries widely patent to the termini without stenosis. A1 segments widely patent. Normal anterior communicating artery complex. Both anterior cerebral arteries widely patent to their distal aspects without stenosis. No M1 stenosis or occlusion. Normal MCA bifurcations. Distal MCA branches well perfused and symmetric. Posterior circulation: Dominant left vertebral artery widely patent. Right vertebral artery hypoplastic and  terminates in PICA. Both PICA patent. Basilar widely patent to its distal aspect without stenosis. Superior cerebellar and posterior cerebral arteries widely patent bilaterally. Venous sinuses: Patent allowing for timing the contrast bolus. Anatomic variants: Hypoplastic right vertebral artery terminates in PICA. No intracranial aneurysm. Review of the MIP images confirms the above findings IMPRESSION: 1. Normal CTA of the head and neck. No large vessel occlusion, hemodynamically significant stenosis, or other acute vascular abnormality. 2. 1.9 cm left thyroid nodule, indeterminate. Further evaluation with dedicated thyroid ultrasound recommended for further evaluation. This could be performed on a nonemergent outpatient basis. (Ref: J Am Coll Radiol. 2015 Feb;12(2): 143-50). 3. Small layering left pleural effusion, partially visualized. Electronically Signed   By: Jeannine Boga M.D.   On: 04/19/2021 19:26   MR BRAIN WO CONTRAST  Result Date: 04/19/2021 CLINICAL DATA:  Motor neuron disease suspected EXAM: MRI HEAD WITHOUT CONTRAST TECHNIQUE: Multiplanar, multiecho pulse sequences of the brain and surrounding structures were obtained without intravenous contrast. COMPARISON:  MRI 12/27/2013. FINDINGS: Brain: Multiple T2/FLAIR hyperintense lesions in the white matter, many of which are oriented perpendicular to the long axis of the lateral ventricle in the region of the callososeptal interface. There also lesions within the corpus callosum. A few small areas of DWI hyperintensity in the left frontal white matter correlate with white matter signal abnormality without definite ADC correlate. No evidence of acute hemorrhage, hydrocephalus, mass lesion, midline shift, or extra-axial fluid collection. Vascular: Major arterial flow voids are maintained at the skull base. Skull and upper cervical spine: Normal marrow signal. Sinuses/Orbits: Minimal paranasal sinus mucosal thickening. Unremarkable orbits. Other: No  mastoid effusions. IMPRESSION: 1. Findings concerning for demyelination, including age advanced number of characteristic T2/FLAIR hyperintense lesions in the periventricular white matter and corpus callosum. These are new since 2015. 2. A few small areas of DWI hyperintensity in the left frontal white matter correlate with white matter signal abnormality and could represent active demyelination or T2 shine through. Small infarcts is thought unlikely. Postcontrast imaging may be helpful to assess for the characteristic enhancement of active demyelination. Electronically Signed   By: Margaretha Sheffield M.D.   On: 04/19/2021 18:16   MR BRAIN W CONTRAST  Result Date: 04/20/2021 CLINICAL DATA:  Provided history: Ataxia, nontraumatic, stroke excluded. Additional history provided: Patient with sudden onset lower extremity weakness. EXAM: MRI HEAD WITH CONTRAST TECHNIQUE: Multiplanar, multiecho pulse sequences of the brain and surrounding structures were obtained with intravenous contrast. CONTRAST:  60mL GADAVIST GADOBUTROL 1 MMOL/ML IV SOLN FINDINGS: A contrast-enhanced brain MRI was performed as a follow-up to the recent non-contrast brain MRI of 04/19/2021. The following sequences were acquired for the current examination: Pre contrast axial T1 weighted sequence, postcontrast axial T1 weighted sequence, postcontrast coronal T1 weighted sequence, sagittal postcontrast T1 weighted sequence, coronal postcontrast T2  weighted sequence. Corresponding enhancement is present at sites of the previously described T2 FLAIR hyperintense white matter lesions within the right centrum semiovale (series 7, image 37), left corona radiata (series 7, image 35) and genu of corpus callosum (series 7, image 30). IMPRESSION: Lesions within the right centrum semiovale, left corona radiata and within the genu of the corpus callosum demonstrate corresponding enhancement. Findings likely reflect sites of active demyelination. Electronically  Signed   By: Kellie Simmering D.O.   On: 04/20/2021 08:06   MR Cervical Spine W or Wo Contrast  Result Date: 04/20/2021 CLINICAL DATA:  Ataxia, nontraumatic, cervical pathology suspected. EXAM: MRI CERVICAL SPINE WITHOUT AND WITH CONTRAST TECHNIQUE: Multiplanar and multiecho pulse sequences of the cervical spine, to include the craniocervical junction and cervicothoracic junction, were obtained without and with intravenous contrast. CONTRAST:  60mL GADAVIST GADOBUTROL 1 MMOL/ML IV SOLN COMPARISON:  CT angiogram head/neck 04/19/2021. FINDINGS: Alignment: Straightening of the expected cervical lordosis. No significant spondylolisthesis. Vertebrae: Vertebral body height is maintained. No significant marrow edema or focal suspicious osseous lesion. Cord: There are multiple short-segment T2 STIR hyperintense lesions within the cervicomedullary junction and cervical spinal cord, most prominent within the left dorsolateral aspect of the cervicomedullary junction, within the central and right aspect of the spinal cord at the C1-C2 level, within the dorsal aspect of the spinal cord at the C2 level, within the central and right aspect of the spinal cord at the C2 level, within the bilateral cord at the C2-C3 level, within the bilateral cord at the C3-C4 level, within the right aspect of the cord at the C4 level, within the bilateral cord at the C5 level, within the left dorsal lateral aspect of the cord at the C6 level, within the bilateral cord at the C7 level and within the dorsal aspect of the cord at the inferior C7 level. Spinal cord lesions demonstrate corresponding enhancement at the C2-C3 level, at the C5 level and at the C6 level. Posterior Fossa, vertebral arteries, paraspinal tissues: Posterior fossa better assessed on same-day brain MRI. Flow voids preserved within the imaged cervical vertebral arteries. Paraspinal soft tissues unremarkable. Redemonstrated 1.9 cm left thyroid lobe nodule. Disc levels: No more than  mild disc degeneration within the cervical spine. C2-C3: No significant disc herniation or stenosis. C3-C4: Disc bulge. Superimposed small central disc protrusion. Bilateral uncovertebral hypertrophy and facet arthrosis. Mild relative spinal canal narrowing (without spinal cord mass effect). Mild right neural foraminal narrowing. C4-C5: Shallow disc bulge. Uncovertebral hypertrophy (predominantly on the right). No significant spinal canal stenosis. Mild right neural foraminal narrowing. C5-C6: Small central disc protrusion. No significant spinal canal or foraminal stenosis. C6-C7: Disc bulge. Superimposed broad-based right center disc protrusion. Uncovertebral hypertrophy (predominantly on the right). Minimal facet arthrosis. Mild relative spinal canal narrowing. The disc protrusion may contact the ventral aspect of the spinal cord. Mild bilateral neural foraminal narrowing. C7-T1: No significant disc herniation or stenosis. IMPRESSION: Multiple short-segment T2 STIR hyperintense lesions within the cervicomedullary junction and cervical spinal cord, as described. Findings are highly suspicious for a demyelinating process (such as multiple sclerosis). Spinal cord lesions demonstrate corresponding enhancement at the C2-C3, C5 and C6 levels and these likely reflect sites of active demyelination. Cervical spondylosis, as described. No more than mild spinal canal stenosis or neural foraminal narrowing. Straightening of the expected cervical lordosis. Redemonstrated 1.9 cm left thyroid lobe nodule. A nonemergent thyroid ultrasound is recommended for further evaluation. Electronically Signed   By: Kellie Simmering D.O.   On: 04/20/2021 08:30  MR THORACIC SPINE W WO CONTRAST  Result Date: 04/20/2021 CLINICAL DATA:  Ataxia, nontraumatic, thoracic pathology suspected. EXAM: MRI THORACIC WITHOUT AND WITH CONTRAST TECHNIQUE: Multiplanar and multiecho pulse sequences of the thoracic spine were obtained without and with  intravenous contrast. CONTRAST:  73mL GADAVIST GADOBUTROL 1 MMOL/ML IV SOLN COMPARISON:  None. FINDINGS: Mild intermittent motion degradation, limiting evaluation. Alignment: No significant spondylolisthesis. Vertebrae: Vertebral body height is maintained. No significant marrow edema or focal suspicious osseous lesion. Hemangioma within the T12 vertebral body. Cord: There are multiple short-segment T2 STIR hyperintense lesions within the thoracic spinal cord, most prominent within the left aspect of the spinal cord at the T1-T2 level, within the central and right aspect of the spinal cord at the T4-T5 level, within the central and right aspect of the spinal cord at the T5-T6 level, within the left aspect of the spinal cord at the T7 level, within the central and right ventrolateral aspect of the spinal cord at the T8-T9 level, within the dorsal and right aspect of the spinal cord at the T10-T11 level and within the dorsal aspect of the spinal cord at the T12 level. Spinal cord lesions demonstrate corresponding enhancement at the T1 level, T7 level, T8 level, T9 level, T10 level and T11 level. Paraspinal and other soft tissues: Small bilateral layering pleural effusions. No abnormality identified within included portions of the upper abdomen/retroperitoneum. Disc levels: No more than mild disc degeneration within the thoracic spine. Multilevel disc bulges. Mild facet arthrosis within the lower thoracic spine. Additional notable level by level findings as follows. At T6-T7, there is a broad-based left center disc protrusion. The disc protrusion focally effaces the ventral thecal sac, contacting and mildly flattening the left ventral aspect of the spinal cord. However, the dorsal CSF space is maintained within the spinal canal. At T7-T8, there is a broad-based left center disc protrusion. The disc protrusion focally effaces the ventral thecal sac, contacting and mildly flattening the left ventral aspect of the spinal  cord. However, the dorsal CSF space is maintained within the spinal canal. At T8-T9, there is a shallow broad-based right center disc protrusion which results in minimal focal effacement of the ventral thecal sac (without significant spinal cord mass effect). At T10-T11, there is a broad-based central disc protrusion with associated endplate spurring. The disc protrusion contacts and mildly flattens the ventral aspect of the spinal cord. However, the dorsal CSF space is maintained within the spinal canal. No significant foraminal stenosis within the thoracic spine. IMPRESSION: Multiple short-segment T2 STIR hyperintense lesions within the thoracic spinal cord, as described and highly suspicious for a demyelinating process (such as multiple sclerosis). Spinal cord lesions demonstrate corresponding enhancement at the T1, T7, T8, T9, T10 and T11 levels and these likely reflect sites of active demyelination. Thoracic spondylosis, as described. Most notably, disc protrusions contact and mildly flatten the ventral spinal cord at T6-T7, T7-T8 and T10-T11. However, the dorsal CSF space is maintained within the spinal canal at these levels. Small bilateral pleural effusions. Electronically Signed   By: Kellie Simmering D.O.   On: 04/20/2021 08:19    EKG: pending  ASSESSMENT & PLAN:  Assessment: Principal Problem:   Demyelinating disease of central nervous system (HCC)   Odaly Skates is a 46 y.o. with pertinent PMH of generalized anxiety disorder and bipolar I disorder who presented with bilateral lower extremity weakness and gait instability and admit for demyelinating disease on hospital day 0  Plan: #Demyelinating disease of CNS Patient is presenting with  one week of progressively worsening bilateral lower extremity weakness with ataxia and frequent falls without head or neck trauma. She also endorses one day of neck pain. No prior history of similar symptoms or family history of neurologic diseases. No recent  viral illness. She has evidence of ataxia of LUE and BLE with decreased strength in the bilateral lower extremities on exam. Neurology consulted, and given concern for demyelinating disease, MRI brain and spine ordered. This was significant for active demyelination within the right centrum semiovale, left corona radiata and genu of corpus callosum with multiple hyperintense lesions within the cervical and thoracic spine.  - Neurology consulted, appreciate recommendations - IV solumedrol daily x5d, daily protonix  - F/u serum NMO panel and Mog Ab - Vitamin B12, HIV, RPR, A1c, TSH - PT/OT eval   #GAD Patient with history of generalized anxiety disorder for which she is on buspar and xanax. Reports recent stress at work. Currently feeling overwhelmed with diagnosis.  - Resume home buspar 15mg  tid and xanax 1mg  tid prn  #Bipolar I disorder - Resume home lamictal 200mg  qHS and risperidone 5mg  qHS   #Thyroid nodule 1.9cm left thyroid lobe nodule noted on MRI. She endorses fatigue, dry skin, and recent weight gain. Otherwise asymptomatic.  - TSH - Thyroid US as outpatient   Best Practice: Diet: Regular diet IVF: Fluids: None, Rate: None VTE: rivaroxaban (XARELTO) tablet 10 mg Start: 04/20/21 1000 Code: Full AB: None Status: Inpatient with expected length of stay greater than 2 midnights. Anticipated Discharge Location: Home Barriers to Discharge: Medical stability  Signature: Harvie Heck, MD Internal Medicine Resident, PGY-3 [facility]  Pager: [phone] 10:23 AM, 04/20/2021   Please contact the on call pager after 5 pm and on weekends at [phone].

## 2021-04-20 NOTE — ED Notes (Signed)
PT taken to MRI

## 2021-04-20 NOTE — ED Provider Notes (Signed)
[facility] EMERGENCY DEPARTMENT Provider Note  CSN: [id number] Arrival date & time: 04/19/21 1403  Chief Complaint(s) Neck Pain, Gait Problem, and Weakness  HPI [name] is a 46 y.o. female who presents to the emergency department with 1 week of left-sided paresthesias and gait instability.  Symptoms began last Wednesday.  Initially mild.  Today they became significantly worse.  Patient has had numerous falls during that time.  Reports that she now has neck pain that began today, not related to a fall.  Denies any recent fevers or infections.  No coughing or congestion.  No visual changes.  No nausea or vomiting.  No chest pain or shortness of breath.  No diarrhea.   Neck Pain Associated symptoms: weakness   Weakness  Past Medical History Past Medical History:  Diagnosis Date   Anxiety    Bipolar 1 disorder [facility])    Patient Active Problem List   Diagnosis Date Noted   HYPERTHYROIDISM 02/14/2006   HYPERTENSION 02/14/2006   HEPATITIS C 01/11/2006   GRAVE'S DISEASE 01/11/2006   BIPOLAR AFFECTIVE DISORDER, HX OF 01/11/2006   Home Medication(s) Prior to Admission medications   Medication Sig Start Date End Date Taking? Authorizing Provider  ALPRAZolam Duanne Moron) 1 MG tablet One po tid prn. Patient taking differently: Take 1 mg by mouth 3 (three) times daily. One po tid prn. 03/21/21  Yes Hurst, Helene Kelp T, PA-C  busPIRone (BUSPAR) 15 MG tablet TAKE 2 TABLETS BY MOUTH EVERY MORNING AND 1 TABLET BY MOUTH EVERY EVENING Patient taking differently: Take 15 mg by mouth 3 (three) times daily. TAKE 2 TABLETS BY MOUTH EVERY MORNING AND 1 TABLET BY MOUTH EVERY EVENING 03/16/21  Yes Hurst, Teresa T, PA-C  ibuprofen (ADVIL) 200 MG tablet Take 200 mg by mouth every 6 (six) hours as needed for headache or mild pain.   Yes [provider]  lamoTRIgine (LAMICTAL) 200 MG tablet TAKE 1 TABLET AT BEDTIME Patient taking differently: Take 200 mg by mouth daily. 04/14/21  Yes  Hurst, Helene Kelp T, PA-C  risperidone (RISPERDAL) 4 MG tablet TAKE 1 TABLET AT BEDTIME Patient taking differently: Take 4 mg by mouth daily. 04/15/21  Yes Addison Lank, PA-C                                                                                                                                    Allergies Patient has no known allergies.  Review of Systems Review of Systems  Musculoskeletal:  Positive for neck pain.  Neurological:  Positive for weakness.  As noted in HPI  Physical Exam Vital Signs  I have reviewed the triage vital signs BP 114/72    Pulse 86    Temp 99.1 F (37.3 C) (Oral)    Resp 18    SpO2 100%   Physical Exam Vitals reviewed.  Constitutional:      General: She is not in acute distress.  Appearance: She is well-developed. She is not diaphoretic.  HENT:     Head: Normocephalic and atraumatic.     Nose: Nose normal.  Eyes:     General: No scleral icterus.       Right eye: No discharge.        Left eye: No discharge.     Conjunctiva/sclera: Conjunctivae normal.     Pupils: Pupils are equal, round, and reactive to light.  Cardiovascular:     Rate and Rhythm: Normal rate and regular rhythm.     Heart sounds: No murmur heard.   No friction rub. No gallop.  Pulmonary:     Effort: Pulmonary effort is normal. No respiratory distress.     Breath sounds: Normal breath sounds. No stridor. No rales.  Abdominal:     General: There is no distension.     Palpations: Abdomen is soft.     Tenderness: There is no abdominal tenderness.  Musculoskeletal:        General: No tenderness.     Cervical back: Normal range of motion and neck supple.  Skin:    General: Skin is warm and dry.     Findings: No erythema or rash.  Neurological:     Mental Status: She is alert and oriented to person, place, and time.     Comments: Mental Status:  Alert and oriented to person, place, and time.  Attention and concentration normal.  Speech clear.  Recent memory is  intact  Cranial Nerves:  II Visual Fields: Intact to confrontation. Visual fields intact. III, IV, VI: Pupils equal and reactive to light and near. Full eye movement without nystagmus  V Facial Sensation: Normal. No weakness of masticatory muscles  VII: No facial weakness or asymmetry  VIII Auditory Acuity: Grossly normal  IX/X: The uvula is midline; the palate elevates symmetrically  XI: Normal sternocleidomastoid and trapezius strength  XII: The tongue is midline. No atrophy or fasciculations.   Motor System: Muscle Strength: 5/5 and symmetric in the upper extremities.  3/5 and symmetric strength in the lower extremities. no pronation or drift.  Muscle Tone: Tone and muscle bulk are normal in the upper and lower extremities.  Reflexes: DTRs: No Clonus Coordination: Mild dysmetria with finger-nose on the left.  Sensation: Intact to light touch Gait: Deferred     ED Results and Treatments Labs (all labs ordered are listed, but only abnormal results are displayed) Labs Reviewed  COMPREHENSIVE METABOLIC PANEL - Abnormal; Notable for the following components:      Result Value   Potassium 6.4 (*)    All other components within normal limits  RAPID URINE DRUG SCREEN, HOSP PERFORMED - Abnormal; Notable for the following components:   Benzodiazepines POSITIVE (*)    All other components within normal limits  RESP PANEL BY RT-PCR (FLU A&B, COVID) ARPGX2  PROTIME-INR  APTT  CBC  DIFFERENTIAL  POTASSIUM  EKG  EKG Interpretation  Date/Time:    Ventricular Rate:    PR Interval:    QRS Duration:   QT Interval:    QTC Calculation:   R Axis:     Text Interpretation:         Radiology CT ANGIO HEAD NECK W WO CM  Result Date: 04/19/2021 CLINICAL DATA:  Initial evaluation for neuro deficit, stroke suspected. EXAM: CT ANGIOGRAPHY HEAD AND NECK TECHNIQUE:  Multidetector CT imaging of the head and neck was performed using the standard protocol during bolus administration of intravenous contrast. Multiplanar CT image reconstructions and MIPs were obtained to evaluate the vascular anatomy. Carotid stenosis measurements (when applicable) are obtained utilizing NASCET criteria, using the distal internal carotid diameter as the denominator. CONTRAST:  61mL OMNIPAQUE IOHEXOL 350 MG/ML SOLN COMPARISON:  MRI from earlier the same day. FINDINGS: CT HEAD FINDINGS Brain: Cerebral volume within normal limits. Cerebral white matter disease better characterized on prior MRI. No acute intracranial hemorrhage. No acute large vessel territory infarct. No mass lesion, midline shift or mass effect. No hydrocephalus or extra-axial fluid collection. Vascular: No hyperdense vessel. Skull: Scalp soft tissues and calvarium within normal limits. Sinuses: Paranasal sinuses and mastoid air cells are clear. Orbits: Globes normal soft tissues demonstrate no acute finding. Review of the MIP images confirms the above findings CTA NECK FINDINGS Aortic arch: Visualized aortic arch normal caliber with normal branch pattern. No stenosis about the origin the great vessels. Right carotid system: Right common and internal carotid arteries widely patent without stenosis, dissection or occlusion. Left carotid system: Left common and internal carotid arteries widely patent without stenosis, dissection or occlusion. Vertebral arteries: Both vertebral arteries arise from the subclavian arteries. No proximal subclavian artery stenosis. Both vertebral arteries widely patent without stenosis, dissection or occlusion. Skeleton: Mild spondylosis present at C6-7. Other neck: 1.9 cm nodule seen extending from the lower pole the left thyroid lobe (series 9, image 144). Upper chest: Small layering left pleural effusion partially visualized. Visualized upper chest demonstrates no other acute finding. Review of the MIP  images confirms the above findings CTA HEAD FINDINGS Anterior circulation: Both internal carotid arteries widely patent to the termini without stenosis. A1 segments widely patent. Normal anterior communicating artery complex. Both anterior cerebral arteries widely patent to their distal aspects without stenosis. No M1 stenosis or occlusion. Normal MCA bifurcations. Distal MCA branches well perfused and symmetric. Posterior circulation: Dominant left vertebral artery widely patent. Right vertebral artery hypoplastic and terminates in PICA. Both PICA patent. Basilar widely patent to its distal aspect without stenosis. Superior cerebellar and posterior cerebral arteries widely patent bilaterally. Venous sinuses: Patent allowing for timing the contrast bolus. Anatomic variants: Hypoplastic right vertebral artery terminates in PICA. No intracranial aneurysm. Review of the MIP images confirms the above findings IMPRESSION: 1. Normal CTA of the head and neck. No large vessel occlusion, hemodynamically significant stenosis, or other acute vascular abnormality. 2. 1.9 cm left thyroid nodule, indeterminate. Further evaluation with dedicated thyroid ultrasound recommended for further evaluation. This could be performed on a nonemergent outpatient basis. (Ref: J Am Coll Radiol. 2015 Feb;12(2): 143-50). 3. Small layering left pleural effusion, partially visualized. Electronically Signed   By: Jeannine Boga M.D.   On: 04/19/2021 19:26   MR BRAIN WO CONTRAST  Result Date: 04/19/2021 CLINICAL DATA:  Motor neuron disease suspected EXAM: MRI HEAD WITHOUT CONTRAST TECHNIQUE: Multiplanar, multiecho pulse sequences of the brain and surrounding structures were obtained without intravenous contrast. COMPARISON:  MRI 12/27/2013. FINDINGS: Brain:  Multiple T2/FLAIR hyperintense lesions in the white matter, many of which are oriented perpendicular to the long axis of the lateral ventricle in the region of the callososeptal  interface. There also lesions within the corpus callosum. A few small areas of DWI hyperintensity in the left frontal white matter correlate with white matter signal abnormality without definite ADC correlate. No evidence of acute hemorrhage, hydrocephalus, mass lesion, midline shift, or extra-axial fluid collection. Vascular: Major arterial flow voids are maintained at the skull base. Skull and upper cervical spine: Normal marrow signal. Sinuses/Orbits: Minimal paranasal sinus mucosal thickening. Unremarkable orbits. Other: No mastoid effusions. IMPRESSION: 1. Findings concerning for demyelination, including age advanced number of characteristic T2/FLAIR hyperintense lesions in the periventricular white matter and corpus callosum. These are new since 2015. 2. A few small areas of DWI hyperintensity in the left frontal white matter correlate with white matter signal abnormality and could represent active demyelination or T2 shine through. Small infarcts is thought unlikely. Postcontrast imaging may be helpful to assess for the characteristic enhancement of active demyelination. Electronically Signed   By: Margaretha Sheffield M.D.   On: 04/19/2021 18:16    Pertinent labs & imaging results that were available during my care of the patient were reviewed by me and considered in my medical decision making (see MDM for details).  Medications Ordered in ED Medications  iohexol (OMNIPAQUE) 350 MG/ML injection 75 mL (75 mLs Intravenous Contrast Given 04/19/21 1852)                                                                                                                                     Procedures Procedures  (including critical care time)  Medical Decision Making / ED Course     Patient presents with 1 week of ataxia. Noted to have mild dysmetria on the left with bilateral lower extremity weakness.  Labs and imaging ordered.  My interpretation listed below: CBC without leukocytosis or  anemia. Initial metabolic panel notable for hyperkalemia (due to hemolysis).  Repeat was within normal limits. UDS positive for benzos consistent with her Xanax use. CTA of the head and neck negative for any dissection or obvious occlusions.  Confirmed by radiology.   MRI read by radiology notable for multiple lesions concerning for demyelination.  New from prior MRI from 2015.  I discussed the case with neurology and they recommended obtaining MRI with and without contrast of the brain, cervical and thoracic spine.  If it did show evidence of demyelinating disease, the patient will need to be admitted for IV steroids.  We will give first dose of steroids in the emergency department -1000 mg of Solu-Medrol.     Final Clinical Impression(s) / ED Diagnoses Final diagnoses:  Ataxia           This chart was dictated using voice recognition software.  Despite best efforts to proofread,  errors can occur which can change the documentation  meaning.    Fatima Blank, MD 04/20/21 [phone]

## 2021-04-20 NOTE — ED Notes (Signed)
ED TO INPATIENT HANDOFF REPORT  ED Nurse Name and Phone #:  Mel Almond [phone]  S Name/Age/Gender [name] 46 y.o. female Room/Bed: H011C/H011C  Code Status   Code Status: Full Code  Home/SNF/Other Home Patient oriented to: self, place, time, and situation Is this baseline? 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  Reports L sided arm pain and numbness and L leg numbness since Wednesday that is progressively worse this morning when she went to work.  No arm drift.    Allergies No Known Allergies  Level of Care/Admitting Diagnosis ED Disposition     ED Disposition  Admit   Condition  --   Comment  Hospital Area: [facility] [[id number]]  Level of Care: Med-Surg [16]  May admit patient to [facility] or [facility] if equivalent level of care is available:: No  Covid Evaluation: Confirmed COVID Negative  Diagnosis: Demyelinating disease of central nervous system [facility]) [[id number]]  Admitting Physician: Velna Ochs [[id number]]  Attending Physician: Velna Ochs [[id number]]  Estimated length of stay: past midnight tomorrow  Certification:: I certify this patient will need inpatient services for at least 2 midnights          B Medical/Surgery History Past Medical History:  Diagnosis Date   Anxiety    Bipolar 1 disorder ([place])    Past Surgical History:  Procedure Laterality Date   CESAREAN SECTION  x 2     A IV Location/Drains/Wounds Patient Lines/Drains/Airways Status     Active Line/Drains/Airways     Name Placement date Placement time Site Days   Peripheral IV 04/19/21 20 G Right Antecubital 04/19/21  1851  Antecubital  1            Intake/Output Last 24 hours  Intake/Output Summary (Last 24 hours) at 04/20/2021 1945 Last data filed at 04/20/2021  1232 Gross per 24 hour  Intake 50 ml  Output --  Net 50 ml    Labs/Imaging Results for orders placed or performed during the hospital encounter of 04/19/21 (from the past 48 hour(s))  Urine rapid drug screen (hosp performed)     Status: Abnormal   Collection Time: 04/19/21  3:07 PM  Result Value Ref Range   Opiates NONE DETECTED NONE DETECTED   Cocaine NONE DETECTED NONE DETECTED   Benzodiazepines POSITIVE (A) NONE DETECTED   Amphetamines NONE DETECTED NONE DETECTED   Tetrahydrocannabinol NONE DETECTED NONE DETECTED   Barbiturates NONE DETECTED NONE DETECTED    Comment: (NOTE) DRUG SCREEN FOR MEDICAL PURPOSES ONLY.  IF CONFIRMATION IS NEEDED FOR ANY PURPOSE, NOTIFY LAB WITHIN 5 DAYS.  LOWEST DETECTABLE LIMITS FOR URINE DRUG SCREEN Drug Class                     Cutoff (ng/mL) Amphetamine and metabolites    1000 Barbiturate and metabolites    200 Benzodiazepine                 939 Tricyclics and metabolites     300 Opiates and metabolites        300 Cocaine and metabolites        300 THC                            50 Performed at [facility],  [address] [place], [place] [address]   Resp Panel by RT-PCR (Flu A&B, Covid) Nasopharyngeal Swab     Status: None   Collection Time: 04/19/21  3:09 PM   Specimen: Nasopharyngeal Swab; Nasopharyngeal(NP) swabs in vial transport medium  Result Value Ref Range   SARS Coronavirus 2 by RT PCR NEGATIVE NEGATIVE    Comment: (NOTE) SARS-CoV-2 target nucleic acids are NOT DETECTED.  The SARS-CoV-2 RNA is generally detectable in upper respiratory specimens during the acute phase of infection. The lowest concentration of SARS-CoV-2 viral copies this assay can detect is 138 copies/mL. A negative result does not preclude SARS-Cov-2 infection and should not be used as the sole basis for treatment or other patient management decisions. A negative result may occur with  improper specimen collection/handling, submission of specimen  other than nasopharyngeal swab, presence of viral mutation(s) within the areas targeted by this assay, and inadequate number of viral copies(<138 copies/mL). A negative result must be combined with clinical observations, patient history, and epidemiological information. The expected result is Negative.  Fact Sheet for Patients:  EntrepreneurPulse.com.au  Fact Sheet for Healthcare Providers:  IncredibleEmployment.be  This test is no t yet approved or cleared by the [place] FDA and  has been authorized for detection and/or diagnosis of SARS-CoV-2 by FDA under an Emergency Use Authorization (EUA). This EUA will remain  in effect (meaning this test can be used) for the duration of the COVID-19 declaration under Section 564(b)(1) of the Act, 21 U.S.C.section 360bbb-3(b)(1), unless the authorization is terminated  or revoked sooner.       Influenza A by PCR NEGATIVE NEGATIVE   Influenza B by PCR NEGATIVE NEGATIVE    Comment: (NOTE) The Xpert Xpress SARS-CoV-2/FLU/RSV plus assay is intended as an aid in the diagnosis of influenza from Nasopharyngeal swab specimens and should not be used as a sole basis for treatment. Nasal washings and aspirates are unacceptable for Xpert Xpress SARS-CoV-2/FLU/RSV testing.  Fact Sheet for Patients: EntrepreneurPulse.com.au  Fact Sheet for Healthcare Providers: IncredibleEmployment.be  This test is not yet approved or cleared by the [place] FDA and has been authorized for detection and/or diagnosis of SARS-CoV-2 by FDA under an Emergency Use Authorization (EUA). This EUA will remain in effect (meaning this test can be used) for the duration of the COVID-19 declaration under Section 564(b)(1) of the Act, 21 U.S.C. section 360bbb-3(b)(1), unless the authorization is terminated or revoked.  Performed at [facility], [place] [address], [place], [place] [address]    Protime-INR     Status: None   Collection Time: 04/19/21  3:35 PM  Result Value Ref Range   Prothrombin Time 12.5 11.4 - 15.2 seconds   INR 0.9 0.8 - 1.2    Comment: (NOTE) INR goal varies based on device and disease states. Performed at [facility], [place] [address], [place], [place] [address]   APTT     Status: None   Collection Time: 04/19/21  3:35 PM  Result Value Ref Range   aPTT 27 24 - 36 seconds    Comment: Performed at [place] [address], [place] [address]  CBC     Status: None   Collection Time: 04/19/21  3:35 PM  Result Value Ref Range   WBC 7.8 4.0 - 10.5 K/uL   RBC 4.41 3.87 - 5.11 MIL/uL   Hemoglobin 14.5 12.0 - 15.0 g/dL   HCT 41.7 36.0 - 46.0 %   MCV 94.6 80.0 -  100.0 fL   MCH 32.9 26.0 - 34.0 pg   MCHC 34.8 30.0 - 36.0 g/dL    Comment: REPEATED TO VERIFY CORRECTED FOR COLD AGGLUTININS    RDW 12.6 11.5 - 15.5 %   Platelets 250 150 - 400 K/uL   nRBC 0.0 0.0 - 0.2 %    Comment: Performed at [facility], [place] [address], [place], [place] [address]  Differential     Status: None   Collection Time: 04/19/21  3:35 PM  Result Value Ref Range   Neutrophils Relative % 75 %   Neutro Abs 5.9 1.7 - 7.7 K/uL   Lymphocytes Relative 18 %   Lymphs Abs 1.4 0.7 - 4.0 K/uL   Monocytes Relative 5 %   Monocytes Absolute 0.4 0.1 - 1.0 K/uL   Eosinophils Relative 1 %   Eosinophils Absolute 0.0 0.0 - 0.5 K/uL   Basophils Relative 1 %   Basophils Absolute 0.0 0.0 - 0.1 K/uL   Immature Granulocytes 0 %   Abs Immature Granulocytes 0.03 0.00 - 0.07 K/uL    Comment: Performed at [place] [address], [place], [place] [address]  Comprehensive metabolic panel     Status: Abnormal   Collection Time: 04/19/21  3:35 PM  Result Value Ref Range   Sodium 136 135 - 145 mmol/L   Potassium 6.4 (HH) 3.5 - 5.1 mmol/L    Comment: CRITICAL RESULT CALLED TO, READ BACK BY AND VERIFIED WITH: A HARRIS PA 1712 04/19/2021 BY R VERAAR SLIGHT  HEMOLYSIS    Chloride 107 98 - 111 mmol/L   CO2 24 22 - 32 mmol/L   Glucose, Bld 82 70 - 99 mg/dL    Comment: Glucose reference range applies only to samples taken after fasting for at least 8 hours.   BUN 8 6 - 20 mg/dL   Creatinine, Ser 0.83 0.44 - 1.00 mg/dL   Calcium 10.3 8.9 - 10.3 mg/dL   Total Protein 7.4 6.5 - 8.1 g/dL   Albumin 4.2 3.5 - 5.0 g/dL   AST 23 15 - 41 U/L   ALT 18 0 - 44 U/L   Alkaline Phosphatase 76 38 - 126 U/L   Total Bilirubin 1.0 0.3 - 1.2 mg/dL   GFR, Estimated >60 >60 mL/min    Comment: (NOTE) Calculated using the CKD-EPI Creatinine Equation (2021)    Anion gap 5 5 - 15    Comment: Performed at [facility] [address] [place],  [address]  Potassium     Status: None   Collection Time: 04/19/21  9:46 PM  Result Value Ref Range   Potassium 4.7 3.5 - 5.1 mmol/L    Comment: DELTA CHECK NOTED Performed at [facility], [place] [address], [place],  [address]    CT ANGIO HEAD NECK W WO CM  Result Date: 04/19/2021 CLINICAL DATA:  Initial evaluation for neuro deficit, stroke suspected. EXAM: CT ANGIOGRAPHY HEAD AND NECK TECHNIQUE: Multidetector CT imaging of the head and neck was performed using the standard protocol during bolus administration of intravenous contrast. Multiplanar CT image reconstructions and MIPs were obtained to evaluate the vascular anatomy. Carotid stenosis measurements (when applicable) are obtained utilizing NASCET criteria, using the distal internal carotid diameter as the denominator. CONTRAST:  51mL OMNIPAQUE IOHEXOL 350 MG/ML SOLN COMPARISON:  MRI from earlier the same day. FINDINGS: CT HEAD FINDINGS Brain: Cerebral volume within normal limits. Cerebral white matter disease better characterized on prior MRI. No  acute intracranial hemorrhage. No acute large vessel territory infarct. No mass lesion, midline shift or mass effect. No hydrocephalus or extra-axial fluid collection. Vascular: No hyperdense vessel. Skull:  Scalp soft tissues and calvarium within normal limits. Sinuses: Paranasal sinuses and mastoid air cells are clear. Orbits: Globes normal soft tissues demonstrate no acute finding. Review of the MIP images confirms the above findings CTA NECK FINDINGS Aortic arch: Visualized aortic arch normal caliber with normal branch pattern. No stenosis about the origin the great vessels. Right carotid system: Right common and internal carotid arteries widely patent without stenosis, dissection or occlusion. Left carotid system: Left common and internal carotid arteries widely patent without stenosis, dissection or occlusion. Vertebral arteries: Both vertebral arteries arise from the subclavian arteries. No proximal subclavian artery stenosis. Both vertebral arteries widely patent without stenosis, dissection or occlusion. Skeleton: Mild spondylosis present at C6-7. Other neck: 1.9 cm nodule seen extending from the lower pole the left thyroid lobe (series 9, image 144). Upper chest: Small layering left pleural effusion partially visualized. Visualized upper chest demonstrates no other acute finding. Review of the MIP images confirms the above findings CTA HEAD FINDINGS Anterior circulation: Both internal carotid arteries widely patent to the termini without stenosis. A1 segments widely patent. Normal anterior communicating artery complex. Both anterior cerebral arteries widely patent to their distal aspects without stenosis. No M1 stenosis or occlusion. Normal MCA bifurcations. Distal MCA branches well perfused and symmetric. Posterior circulation: Dominant left vertebral artery widely patent. Right vertebral artery hypoplastic and terminates in PICA. Both PICA patent. Basilar widely patent to its distal aspect without stenosis. Superior cerebellar and posterior cerebral arteries widely patent bilaterally. Venous sinuses: Patent allowing for timing the contrast bolus. Anatomic variants: Hypoplastic right vertebral artery  terminates in PICA. No intracranial aneurysm. Review of the MIP images confirms the above findings IMPRESSION: 1. Normal CTA of the head and neck. No large vessel occlusion, hemodynamically significant stenosis, or other acute vascular abnormality. 2. 1.9 cm left thyroid nodule, indeterminate. Further evaluation with dedicated thyroid ultrasound recommended for further evaluation. This could be performed on a nonemergent outpatient basis. (Ref: J Am Coll Radiol. 2015 Feb;12(2): 143-50). 3. Small layering left pleural effusion, partially visualized. Electronically Signed   By: Jeannine Boga M.D.   On: 04/19/2021 19:26   MR BRAIN WO CONTRAST  Result Date: 04/19/2021 CLINICAL DATA:  Motor neuron disease suspected EXAM: MRI HEAD WITHOUT CONTRAST TECHNIQUE: Multiplanar, multiecho pulse sequences of the brain and surrounding structures were obtained without intravenous contrast. COMPARISON:  MRI 12/27/2013. FINDINGS: Brain: Multiple T2/FLAIR hyperintense lesions in the white matter, many of which are oriented perpendicular to the long axis of the lateral ventricle in the region of the callososeptal interface. There also lesions within the corpus callosum. A few small areas of DWI hyperintensity in the left frontal white matter correlate with white matter signal abnormality without definite ADC correlate. No evidence of acute hemorrhage, hydrocephalus, mass lesion, midline shift, or extra-axial fluid collection. Vascular: Major arterial flow voids are maintained at the skull base. Skull and upper cervical spine: Normal marrow signal. Sinuses/Orbits: Minimal paranasal sinus mucosal thickening. Unremarkable orbits. Other: No mastoid effusions. IMPRESSION: 1. Findings concerning for demyelination, including age advanced number of characteristic T2/FLAIR hyperintense lesions in the periventricular white matter and corpus callosum. These are new since 2015. 2. A few small areas of DWI hyperintensity in the left  frontal white matter correlate with white matter signal abnormality and could represent active demyelination or T2 shine through. Small infarcts is  thought unlikely. Postcontrast imaging may be helpful to assess for the characteristic enhancement of active demyelination. Electronically Signed   By: Margaretha Sheffield M.D.   On: 04/19/2021 18:16   MR BRAIN W CONTRAST  Result Date: 04/20/2021 CLINICAL DATA:  Provided history: Ataxia, nontraumatic, stroke excluded. Additional history provided: Patient with sudden onset lower extremity weakness. EXAM: MRI HEAD WITH CONTRAST TECHNIQUE: Multiplanar, multiecho pulse sequences of the brain and surrounding structures were obtained with intravenous contrast. CONTRAST:  75mL GADAVIST GADOBUTROL 1 MMOL/ML IV SOLN FINDINGS: A contrast-enhanced brain MRI was performed as a follow-up to the recent non-contrast brain MRI of 04/19/2021. The following sequences were acquired for the current examination: Pre contrast axial T1 weighted sequence, postcontrast axial T1 weighted sequence, postcontrast coronal T1 weighted sequence, sagittal postcontrast T1 weighted sequence, coronal postcontrast T2 weighted sequence. Corresponding enhancement is present at sites of the previously described T2 FLAIR hyperintense white matter lesions within the right centrum semiovale (series 7, image 37), left corona radiata (series 7, image 35) and genu of corpus callosum (series 7, image 30). IMPRESSION: Lesions within the right centrum semiovale, left corona radiata and within the genu of the corpus callosum demonstrate corresponding enhancement. Findings likely reflect sites of active demyelination. Electronically Signed   By: Kellie Simmering D.O.   On: 04/20/2021 08:06   MR Cervical Spine W or Wo Contrast  Result Date: 04/20/2021 CLINICAL DATA:  Ataxia, nontraumatic, cervical pathology suspected. EXAM: MRI CERVICAL SPINE WITHOUT AND WITH CONTRAST TECHNIQUE: Multiplanar and multiecho pulse sequences of  the cervical spine, to include the craniocervical junction and cervicothoracic junction, were obtained without and with intravenous contrast. CONTRAST:  21mL GADAVIST GADOBUTROL 1 MMOL/ML IV SOLN COMPARISON:  CT angiogram head/neck 04/19/2021. FINDINGS: Alignment: Straightening of the expected cervical lordosis. No significant spondylolisthesis. Vertebrae: Vertebral body height is maintained. No significant marrow edema or focal suspicious osseous lesion. Cord: There are multiple short-segment T2 STIR hyperintense lesions within the cervicomedullary junction and cervical spinal cord, most prominent within the left dorsolateral aspect of the cervicomedullary junction, within the central and right aspect of the spinal cord at the C1-C2 level, within the dorsal aspect of the spinal cord at the C2 level, within the central and right aspect of the spinal cord at the C2 level, within the bilateral cord at the C2-C3 level, within the bilateral cord at the C3-C4 level, within the right aspect of the cord at the C4 level, within the bilateral cord at the C5 level, within the left dorsal lateral aspect of the cord at the C6 level, within the bilateral cord at the C7 level and within the dorsal aspect of the cord at the inferior C7 level. Spinal cord lesions demonstrate corresponding enhancement at the C2-C3 level, at the C5 level and at the C6 level. Posterior Fossa, vertebral arteries, paraspinal tissues: Posterior fossa better assessed on same-day brain MRI. Flow voids preserved within the imaged cervical vertebral arteries. Paraspinal soft tissues unremarkable. Redemonstrated 1.9 cm left thyroid lobe nodule. Disc levels: No more than mild disc degeneration within the cervical spine. C2-C3: No significant disc herniation or stenosis. C3-C4: Disc bulge. Superimposed small central disc protrusion. Bilateral uncovertebral hypertrophy and facet arthrosis. Mild relative spinal canal narrowing (without spinal cord mass effect).  Mild right neural foraminal narrowing. C4-C5: Shallow disc bulge. Uncovertebral hypertrophy (predominantly on the right). No significant spinal canal stenosis. Mild right neural foraminal narrowing. C5-C6: Small central disc protrusion. No significant spinal canal or foraminal stenosis. C6-C7: Disc bulge. Superimposed broad-based right center disc  protrusion. Uncovertebral hypertrophy (predominantly on the right). Minimal facet arthrosis. Mild relative spinal canal narrowing. The disc protrusion may contact the ventral aspect of the spinal cord. Mild bilateral neural foraminal narrowing. C7-T1: No significant disc herniation or stenosis. IMPRESSION: Multiple short-segment T2 STIR hyperintense lesions within the cervicomedullary junction and cervical spinal cord, as described. Findings are highly suspicious for a demyelinating process (such as multiple sclerosis). Spinal cord lesions demonstrate corresponding enhancement at the C2-C3, C5 and C6 levels and these likely reflect sites of active demyelination. Cervical spondylosis, as described. No more than mild spinal canal stenosis or neural foraminal narrowing. Straightening of the expected cervical lordosis. Redemonstrated 1.9 cm left thyroid lobe nodule. A nonemergent thyroid ultrasound is recommended for further evaluation. Electronically Signed   By: Kellie Simmering D.O.   On: 04/20/2021 08:30   MR THORACIC SPINE W WO CONTRAST  Result Date: 04/20/2021 CLINICAL DATA:  Ataxia, nontraumatic, thoracic pathology suspected. EXAM: MRI THORACIC WITHOUT AND WITH CONTRAST TECHNIQUE: Multiplanar and multiecho pulse sequences of the thoracic spine were obtained without and with intravenous contrast. CONTRAST:  65mL GADAVIST GADOBUTROL 1 MMOL/ML IV SOLN COMPARISON:  None. FINDINGS: Mild intermittent motion degradation, limiting evaluation. Alignment: No significant spondylolisthesis. Vertebrae: Vertebral body height is maintained. No significant marrow edema or focal  suspicious osseous lesion. Hemangioma within the T12 vertebral body. Cord: There are multiple short-segment T2 STIR hyperintense lesions within the thoracic spinal cord, most prominent within the left aspect of the spinal cord at the T1-T2 level, within the central and right aspect of the spinal cord at the T4-T5 level, within the central and right aspect of the spinal cord at the T5-T6 level, within the left aspect of the spinal cord at the T7 level, within the central and right ventrolateral aspect of the spinal cord at the T8-T9 level, within the dorsal and right aspect of the spinal cord at the T10-T11 level and within the dorsal aspect of the spinal cord at the T12 level. Spinal cord lesions demonstrate corresponding enhancement at the T1 level, T7 level, T8 level, T9 level, T10 level and T11 level. Paraspinal and other soft tissues: Small bilateral layering pleural effusions. No abnormality identified within included portions of the upper abdomen/retroperitoneum. Disc levels: No more than mild disc degeneration within the thoracic spine. Multilevel disc bulges. Mild facet arthrosis within the lower thoracic spine. Additional notable level by level findings as follows. At T6-T7, there is a broad-based left center disc protrusion. The disc protrusion focally effaces the ventral thecal sac, contacting and mildly flattening the left ventral aspect of the spinal cord. However, the dorsal CSF space is maintained within the spinal canal. At T7-T8, there is a broad-based left center disc protrusion. The disc protrusion focally effaces the ventral thecal sac, contacting and mildly flattening the left ventral aspect of the spinal cord. However, the dorsal CSF space is maintained within the spinal canal. At T8-T9, there is a shallow broad-based right center disc protrusion which results in minimal focal effacement of the ventral thecal sac (without significant spinal cord mass effect). At T10-T11, there is a broad-based  central disc protrusion with associated endplate spurring. The disc protrusion contacts and mildly flattens the ventral aspect of the spinal cord. However, the dorsal CSF space is maintained within the spinal canal. No significant foraminal stenosis within the thoracic spine. IMPRESSION: Multiple short-segment T2 STIR hyperintense lesions within the thoracic spinal cord, as described and highly suspicious for a demyelinating process (such as multiple sclerosis). Spinal cord lesions demonstrate corresponding  enhancement at the T1, T7, T8, T9, T10 and T11 levels and these likely reflect sites of active demyelination. Thoracic spondylosis, as described. Most notably, disc protrusions contact and mildly flatten the ventral spinal cord at T6-T7, T7-T8 and T10-T11. However, the dorsal CSF space is maintained within the spinal canal at these levels. Small bilateral pleural effusions. Electronically Signed   By: Kellie Simmering D.O.   On: 04/20/2021 08:19    Pending Labs Unresulted Labs (From admission, onward)     Start     Ordered   04/21/21 0500  HIV Antibody (routine testing w rflx)  (HIV Antibody (Routine testing w reflex) panel)  Tomorrow morning,   R        04/20/21 0852   04/21/21 0500  TSH  Tomorrow morning,   R        04/20/21 0852   04/21/21 1696  Basic metabolic panel  Tomorrow morning,   R        04/20/21 0852   04/21/21 0500  CBC  Tomorrow morning,   R        04/20/21 0852   04/21/21 0500  Vitamin B12  Tomorrow morning,   R        04/20/21 1101   04/21/21 0500  RPR  Tomorrow morning,   R        04/20/21 1102   04/21/21 0500  Hemoglobin A1c  Tomorrow morning,   R        04/20/21 1102   04/20/21 0713  Neuromyelitis optica autoab, IgG  Once,   STAT        04/20/21 0712            Vitals/Pain Today's Vitals   04/20/21 0804 04/20/21 0845 04/20/21 1231 04/20/21 1936  BP: 116/77 105/68 109/71 121/76  Pulse: 78 81 69 70  Resp: 15 18 18 20   Temp:      TempSrc:      SpO2: 97% 98% 97%  98%  PainSc:        Isolation Precautions No active isolations  Medications Medications  methylPREDNISolone sodium succinate (SOLU-MEDROL) 1,000 mg in sodium chloride 0.9 % 50 mL IVPB (0 mg Intravenous Stopped 04/20/21 1232)  pantoprazole (PROTONIX) injection 40 mg (has no administration in time range)  busPIRone (BUSPAR) tablet 15 mg (15 mg Oral Given 04/20/21 1712)  lamoTRIgine (LAMICTAL) tablet 200 mg (has no administration in time range)  risperiDONE (RISPERDAL) tablet 4 mg (has no administration in time range)  ALPRAZolam (XANAX) tablet 1 mg (1 mg Oral Given 04/20/21 1713)  rivaroxaban (XARELTO) tablet 10 mg (10 mg Oral Given 04/20/21 0928)  sodium chloride flush (NS) 0.9 % injection 3 mL (3 mLs Intravenous Given 04/20/21 0928)  acetaminophen (TYLENOL) tablet 650 mg (has no administration in time range)    Or  acetaminophen (TYLENOL) suppository 650 mg (has no administration in time range)  senna-docusate (Senokot-S) tablet 1 tablet (has no administration in time range)  ondansetron (ZOFRAN) tablet 4 mg (has no administration in time range)    Or  ondansetron (ZOFRAN) injection 4 mg (has no administration in time range)  nicotine (NICODERM CQ - dosed in mg/24 hr) patch 7 mg (7 mg Transdermal Patch Applied 04/20/21 1342)  iohexol (OMNIPAQUE) 350 MG/ML injection 75 mL (75 mLs Intravenous Contrast Given 04/19/21 1852)  diazepam (VALIUM) injection 2.5 mg (2.5 mg Intravenous Given 04/20/21 0557)  gadobutrol (GADAVIST) 1 MMOL/ML injection 9 mL (9 mLs Intravenous Contrast Given 04/20/21 0751)  pantoprazole (PROTONIX) injection 40  mg (40 mg Intravenous Given 04/20/21 0919)    Mobility walks with device High fall risk   Focused Assessments Pt reports gait instability. Imaging showed demyelinating disease. Currently using cane for ambulation.    R Recommendations: See Admitting Provider Note  Report given to:   Additional Notes:

## 2021-04-20 NOTE — ED Provider Notes (Signed)
signout from Dr. Leonette Monarch.  46 year old female 1 week of sided paresthesias ataxia.  MRI concerning for demyelinating disease.  She is pending MRI with contrast.  Neuro aware of patient.  Plan is to follow-up on MRI and likely will need admission to the hospital for further management. Physical Exam  BP 117/74    Pulse 72    Temp 99.1 F (37.3 C) (Oral)    Resp 18    SpO2 98%   Physical Exam  ED Course/Procedures     Procedures  MDM  Patient's MRI of brain and thoracic spine concerning for demyelinating disease.  Neurology consult recommending IV Solu-Medrol and PPI.  Patient updated on plan for admission. I did also secure messaged Dr. Quinn Axe from neurology to put on the neurology list for continued following.  Updated patient on working diagnosis and need for admission and she is agreeable with plan.      Judith Rasmussen, MD 04/20/21 1745

## 2021-04-20 NOTE — Consult Note (Signed)
NEUROLOGY CONSULTATION NOTE   Date of service: April 20, 2021 Patient Name: [name] MRN:  [id number] DOB:  [birth year] Reason for consult: "concern for demyelinating disease" Requesting Provider: Fatima Blank, * _ _ _   _ __   _ __ _ _  __ __   _ __   __ _  History of Present Illness  [name] is a 46 y.o. female with PMH significant for anxiety, bipolar 1 disorder who presents with difficulty walking for the last week.  Patient reports that she was with her family at the mall on April 13, 2021 when out of nowhere, had trouble walking.  She has since had progressively worsening difficulty with walking.  She has to hold onto the walls to prevent herself from falling and today started using a cane.  She feels that her left arm and her left leg is numb.  No prior history of similar symptoms, no history of strokes, no history of MS.  She endorses that over the last few months she has had trouble with urination and has had quite a few accidents.  She also endorses constipation.  She has neck pain that started around the time she was in the mall.  She denies any fever, chills no signs or symptoms of an upper respiratory, no signs or symptoms of a urinary tract infection, no signs or symptoms or gastroenteritis.   ROS   Constitutional Denies weight loss, fever and chills.   HEENT Denies changes in vision and hearing.   Respiratory Denies SOB and cough.   CV Denies palpitations and CP   GI Denies abdominal pain, nausea, vomiting and diarrhea.   GU Denies dysuria and urinary frequency.   MSK Denies myalgia and joint pain.   Skin Denies rash and pruritus.   Neurological Denies headache and syncope.   Psychiatric Denies recent changes in mood. Denies anxiety and depression.    Past History   Past Medical History:  Diagnosis Date   Anxiety    Bipolar 1 disorder ([place])    Past Surgical History:  Procedure Laterality Date   CESAREAN SECTION  x 2   No family  history on file. Social History   Socioeconomic History   Marital status: Married    Spouse name: Not on file   Number of children: Not on file   Years of education: Not on file   Highest education level: Not on file  Occupational History   Not on file  Tobacco Use   Smoking status: Every Day    Types: E-cigarettes   Smokeless tobacco: Never   Tobacco comments:    vapes q 2 hours or so. Used to smoke 1 ppd  Substance and Sexual Activity   Alcohol use: Never   Drug use: Never   Sexual activity: Yes    Birth control/protection: Surgical  Other Topics Concern   Not on file  Social History Narrative   ** Merged History Encounter **       Social Determinants of Health   Financial Resource Strain: Not on file  Food Insecurity: Not on file  Transportation Needs: Not on file  Physical Activity: Not on file  Stress: Not on file  Social Connections: Not on file   No Known Allergies  Medications  (Not in a hospital admission)    Vitals   Vitals:   04/20/21 0208 04/20/21 0215 04/20/21 0245 04/20/21 0300  BP: 134/90 (!) 138/91 (!) 133/97 114/72  Pulse: 83 76 76 86  Resp: 20   18  Temp:      TempSrc:      SpO2: 100% 100% 99% 100%     There is no height or weight on file to calculate BMI.  Physical Exam   General: Laying comfortably in bed; in no acute distress.  HENT: Normal oropharynx and mucosa. Normal external appearance of ears and nose.  Neck: Supple, no pain or tenderness  CV: No JVD. No peripheral edema.  Pulmonary: Symmetric Chest rise. Normal respiratory effort.  Abdomen: Soft to touch, non-tender.  Ext: No cyanosis, edema, or deformity  Skin: No rash. Normal palpation of skin.   Musculoskeletal: Normal digits and nails by inspection. No clubbing.   Neurologic Examination  Mental status/Cognition: Alert, oriented to self, place, month and year, good attention.  Speech/language: Fluent, comprehension intact, object naming intact, repetition  intact.  Cranial nerves:   CN II Pupils equal and reactive to light, no VF deficits    CN III,IV,VI EOM intact, no gaze preference or deviation, no nystagmus    CN V normal sensation in V1, V2, and V3 segments bilaterally    CN VII no asymmetry, no nasolabial fold flattening    CN VIII normal hearing to speech    CN IX & X normal palatal elevation, no uvular deviation    CN XI 5/5 head turn and 5/5 shoulder shrug bilaterally    CN XII midline tongue protrusion    Motor:  Muscle bulk: normal, tone increased., pronator drift none. tremor none Mvmt Root Nerve  Muscle Right Left Comments  SA C5/6 Ax Deltoid 5 5   EF C5/6 Mc Biceps 5 5   EE C6/7/8 Rad Triceps 5 5   WF C6/7 Med FCR     WE C7/8 PIN ECU     F Ab C8/T1 U ADM/FDI 5 5   HF L1/2/3 Fem Illopsoas 3 3   KE L2/3/4 Fem Quad 4+ 4+   DF L4/5 D Peron Tib Ant 4+ 4+   PF S1/2 Tibial Grc/Sol 4+ 4+    Reflexes:  Right Left Comments  Pectoralis      Biceps (C5/6) 2 2   Brachioradialis (C5/6) 2 2    Triceps (C6/7) 2 2    Patellar (L3/4) 1 1    Achilles (S1) 1 1    Hoffman      Plantar mute mute   Jaw jerk    Sensation:  Light touch Decreased in LUE and LLE.   Pin prick    Temperature    Vibration   Proprioception    Coordination/Complex Motor:  - Finger to Nose with ataxia in LUE - Heel to shin with ataxia BL - Rapid alternating movement are slowed in RUE. - Gait: deferred for patient safety given BL leg weakness.  Labs   CBC:  Recent Labs  Lab 04/19/21 1535  WBC 7.8  NEUTROABS 5.9  HGB 14.5  HCT 41.7  MCV 94.6  PLT 765    Basic Metabolic Panel:  Lab Results  Component Value Date   NA 136 04/19/2021   K 4.7 04/19/2021   CO2 24 04/19/2021   GLUCOSE 82 04/19/2021   BUN 8 04/19/2021   CREATININE 0.83 04/19/2021   CALCIUM 10.3 04/19/2021   GFRNONAA >60 04/19/2021   GFRAA >90 12/27/2013   Lipid Panel: No results found for: LDLCALC HgbA1c: No results found for: HGBA1C Urine Drug Screen:     Component  Value Date/Time   LABOPIA NONE DETECTED 04/19/2021 1507  COCAINSCRNUR NONE DETECTED 04/19/2021 1507   LABBENZ POSITIVE (A) 04/19/2021 1507   AMPHETMU NONE DETECTED 04/19/2021 1507   THCU NONE DETECTED 04/19/2021 1507   LABBARB NONE DETECTED 04/19/2021 1507    Alcohol Level No results found for: [facility]  MRI Brain w/o contrast(Personally reviewed): 1. Findings concerning for demyelination, including age advanced number of characteristic T2/FLAIR hyperintense lesions in the periventricular white matter and corpus callosum. These are new since 2015. 2. A few small areas of DWI hyperintensity in the left frontal white matter correlate with white matter signal abnormality and could represent active demyelination or T2 shine through. Small infarcts is thought unlikely. Postcontrast imaging may be helpful to assess for the characteristic enhancement of active demyelination.   MRI brain, C, T-spine with and without contrast is pending  Impression   [name] is a 46 y.o. female with PMH significant for anxiety, bipolar 1 disorder who presents with difficulty walking for the last week. Her neurologic examination is notable for BL lower extremity weakness along with left sided numbness. Given her MRI Brain w/o contrast findings, high concern for a demyelinating disease including MS, NMO.  Recommendations  - Will start with MRI Brain with contrast, MRI C and T spine with and without contrast. If concerning for active demyelination, recommend IV Solumedrol 1g Q24 hours x 5 doses along with PPI. - Serum NMO Panel, Serum Mog Ab.  ______________________________________________________________    Thank you for the opportunity to take part in the care of this patient. If you have any further questions, please contact the neurology consultation attending.  Signed,  [place] Pager Number [phone] _ _ _   _ __   _ __ _ _  __ __   _ __   __ _

## 2021-04-20 NOTE — Evaluation (Signed)
Physical Therapy Evaluation Patient Details Name: [name] MRN: [id number] DOB: 1975-06-09 Today's Date: 04/20/2021  History of Present Illness  Pt is a 46 y/o female admitted secondary to BLE weakness (L extremities>R extremities). MRI showed spinal cord lesions demonstrate corresponding  enhancement at the C2-C3, C5 and C6 levels, T1, T7, T8, T9,  T10 and T11 levels, and lesions within the right centrum semiovale, left corona radiata and  within the genu of the corpus callosum demonstrate corresponding  enhancement. PMH includes bipolar disorder  Clinical Impression  Pt admitted secondary to problem above with deficits below. Decreased coordination and weakness noted in L extremities this session. Required min A for mobility tasks using cane. Educated about possible need for RW to increase steadiness, however, will better determine with mobility progression. Hopeful to be able to progress to be able to go home with neuro outpatient PT at d/c. Will continue to follow acutely.        Recommendations for follow up therapy are one component of a multi-disciplinary discharge planning process, led by the attending physician.  Recommendations may be updated based on patient status, additional functional criteria and insurance authorization.  Follow Up Recommendations Other (comment) (TBD; hopeful for neuro outpatient PT)    Assistance Recommended at Discharge Intermittent Supervision/Assistance  Patient can return home with the following  A little help with walking and/or transfers;A little help with bathing/dressing/bathroom;Assistance with cooking/housework;Help with stairs or ramp for entrance    Equipment Recommendations Rolling walker (2 wheels)  Recommendations for Other Services       Functional Status Assessment Patient has had a recent decline in their functional status and demonstrates the ability to make significant improvements in function in a reasonable and predictable amount of  time.     Precautions / Restrictions Precautions Precautions: Fall Restrictions Weight Bearing Restrictions: No      Mobility  Bed Mobility Overal bed mobility: Needs Assistance Bed Mobility: Supine to Sit;Sit to Supine     Supine to sit: Min assist Sit to supine: Supervision   General bed mobility comments: Min A for trunk elevation    Transfers Overall transfer level: Needs assistance Equipment used: Straight cane Transfers: Sit to/from Stand Sit to Stand: Min assist           General transfer comment: Min A for steadying to stand from higher stretcher    Ambulation/Gait Ambulation/Gait assistance: Min assist   Assistive device: Straight cane         General Gait Details: Took side steps at stretcher and required min A for steadying. Decreased coordination on the L when taking steps and mild unsteadiness.  Stairs            Wheelchair Mobility    Modified Rankin (Stroke Patients Only)       Balance Overall balance assessment: Needs assistance Sitting-balance support: No upper extremity supported;Feet supported Sitting balance-Leahy Scale: Good     Standing balance support: Single extremity supported Standing balance-Leahy Scale: Poor Standing balance comment: Reliant on at least 1 UE support                             Pertinent Vitals/Pain Pain Assessment: No/denies pain    Home Living Family/patient expects to be discharged to:: Private residence Living Arrangements: Spouse/significant other Available Help at Discharge: Family Type of Home: House Home Access: Stairs to enter Entrance Stairs-Rails: Right;Left;Can reach both Entrance Stairs-Number of Steps: 2 Alternate Level Stairs-Number of Steps:  flight Home Layout: Two level Home Equipment: Cane - single point;Shower seat      Prior Function Prior Level of Function : Independent/Modified Independent             Mobility Comments: Has been having to use cane  secondary to new onset symptoms.       Hand Dominance        Extremity/Trunk Assessment   Upper Extremity Assessment Upper Extremity Assessment: Defer to OT evaluation    Lower Extremity Assessment Lower Extremity Assessment: LLE deficits/detail LLE Deficits / Details: Decreased coordination for heel to shin. Grossly 3+/5 throughout LLE. Increased tremors noted.    Cervical / Trunk Assessment Cervical / Trunk Assessment: Normal  Communication   Communication: No difficulties  Cognition Arousal/Alertness: Awake/alert Behavior During Therapy: WFL for tasks assessed/performed Overall Cognitive Status: Within Functional Limits for tasks assessed                                          General Comments General comments (skin integrity, edema, etc.): Pt's husband present    Exercises     Assessment/Plan    PT Assessment Patient needs continued PT services  PT Problem List Decreased strength;Decreased activity tolerance;Decreased balance;Decreased mobility       PT Treatment Interventions DME instruction;Gait training;Therapeutic activities;Functional mobility training;Stair training;Therapeutic exercise;Balance training;Patient/family education    PT Goals (Current goals can be found in the Care Plan section)  Acute Rehab PT Goals Patient Stated Goal: to go home PT Goal Formulation: With patient/family Time For Goal Achievement: 05/04/21 Potential to Achieve Goals: Good    Frequency Min 3X/week     Co-evaluation               AM-PAC PT "6 Clicks" Mobility  Outcome Measure Help needed turning from your back to your side while in a flat bed without using bedrails?: A Little Help needed moving from lying on your back to sitting on the side of a flat bed without using bedrails?: A Little Help needed moving to and from a bed to a chair (including a wheelchair)?: A Little Help needed standing up from a chair using your arms (e.g., wheelchair  or bedside chair)?: A Little Help needed to walk in hospital room?: A Little Help needed climbing 3-5 steps with a railing? : A Lot 6 Click Score: 17    End of Session Equipment Utilized During Treatment: Gait belt Activity Tolerance: Patient tolerated treatment well Patient left: in bed;with call bell/phone within reach;with family/visitor present (on stretcher in ED) Nurse Communication: Mobility status PT Visit Diagnosis: Unsteadiness on feet (R26.81);Muscle weakness (generalized) (M62.81);Difficulty in walking, not elsewhere classified (R26.2)    Time: 3244-0102 PT Time Calculation (min) (ACUTE ONLY): 18 min   Charges:   PT Evaluation $PT Eval Moderate Complexity: 1 Mod          Reuel Derby, PT, DPT  Acute Rehabilitation Services  Pager: [phone] Office: [phone]   Rudean Hitt 04/20/2021, 4:15 PM

## 2021-04-21 ENCOUNTER — Inpatient Hospital Stay (HOSPITAL_COMMUNITY): Payer: BC Managed Care – PPO

## 2021-04-21 DIAGNOSIS — E041 Nontoxic single thyroid nodule: Secondary | ICD-10-CM | POA: Diagnosis not present

## 2021-04-21 DIAGNOSIS — G379 Demyelinating disease of central nervous system, unspecified: Secondary | ICD-10-CM

## 2021-04-21 DIAGNOSIS — G35 Multiple sclerosis: Principal | ICD-10-CM

## 2021-04-21 LAB — VITAMIN B12: Vitamin B-12: 215 pg/mL (ref 180–914)

## 2021-04-21 LAB — BASIC METABOLIC PANEL
Anion gap: 7 (ref 5–15)
BUN: 10 mg/dL (ref 6–20)
CO2: 21 mmol/L — ABNORMAL LOW (ref 22–32)
Calcium: 10.8 mg/dL — ABNORMAL HIGH (ref 8.9–10.3)
Chloride: 107 mmol/L (ref 98–111)
Creatinine, Ser: 0.94 mg/dL (ref 0.44–1.00)
GFR, Estimated: >60 mL/min (ref >60)
Glucose, Bld: 189 mg/dL — ABNORMAL HIGH (ref 70–99)
Potassium: 4 mmol/L (ref 3.5–5.1)
Sodium: 135 mmol/L (ref 135–145)

## 2021-04-21 LAB — CBC
HCT: 41 % (ref 36.0–46.0)
Hemoglobin: 14.1 g/dL (ref 12.0–15.0)
MCH: 31.8 pg (ref 26.0–34.0)
MCHC: 34.4 g/dL (ref 30.0–36.0)
MCV: 92.6 fL (ref 80.0–100.0)
Platelets: 267 10*3/uL (ref 150–400)
RBC: 4.43 MIL/uL (ref 3.87–5.11)
RDW: 12 % (ref 11.5–15.5)
WBC: 12.8 10*3/uL — ABNORMAL HIGH (ref 4.0–10.5)
nRBC: 0 % (ref 0.0–0.2)

## 2021-04-21 LAB — HEMOGLOBIN A1C
Hgb A1c MFr Bld: 4.7 % — ABNORMAL LOW (ref 4.8–5.6)
Mean Plasma Glucose: 88.19 mg/dL

## 2021-04-21 LAB — TSH: TSH: 0.755 u[IU]/mL (ref 0.350–4.500)

## 2021-04-21 LAB — HIV ANTIBODY (ROUTINE TESTING W REFLEX): HIV Screen 4th Generation wRfx: NONREACTIVE

## 2021-04-21 LAB — RPR: RPR Ser Ql: NONREACTIVE

## 2021-04-21 NOTE — Evaluation (Signed)
Occupational Therapy Evaluation Patient Details Name: [name] MRN: [id number] DOB: [birth year] Today's Date: 04/21/2021   History of Present Illness Pt is a 46 y/o female admitted secondary to BLE weakness (L extremities>R extremities). MRI showed spinal cord lesions demonstrate corresponding  enhancement at the C2-C3, C5 and C6 levels, T1, T7, T8, T9,  T10 and T11 levels, and lesions within the right centrum semiovale, left corona radiata and  within the genu of the corpus callosum demonstrate corresponding  enhancement. PMH includes bipolar disorder   Clinical Impression   Pt admitted for concerns listed above. PTA pt reported that she was independent with all ADL's and IADL's, including working and driving. At this time, pt presents with balance deficits, as well as mild weakness. Pt requiring min guard for safety and is able to steady herself more with 1 UE supported with tasks. OT will follow acutely.      Recommendations for follow up therapy are one component of a multi-disciplinary discharge planning process, led by the attending physician.  Recommendations may be updated based on patient status, additional functional criteria and insurance authorization.   Follow Up Recommendations  No OT follow up    Assistance Recommended at Discharge PRN  Patient can return home with the following      Functional Status Assessment  Patient has had a recent decline in their functional status and demonstrates the ability to make significant improvements in function in a reasonable and predictable amount of time.  Equipment Recommendations  None recommended by OT    Recommendations for Other Services       Precautions / Restrictions Precautions Precautions: Fall Restrictions Weight Bearing Restrictions: No      Mobility Bed Mobility Overal bed mobility: Needs Assistance Bed Mobility: Supine to Sit;Sit to Supine     Supine to sit: Supervision Sit to supine: Supervision    General bed mobility comments: S for safety    Transfers Overall transfer level: Needs assistance Equipment used: None Transfers: Sit to/from Stand Sit to Stand: Min guard           General transfer comment: Min G to steady      Balance Overall balance assessment: Needs assistance Sitting-balance support: No upper extremity supported;Feet supported Sitting balance-Leahy Scale: Good     Standing balance support: Single extremity supported;No upper extremity supported;During functional activity Standing balance-Leahy Scale: Fair Standing balance comment: Requiring 1 UE support with dynamix tasks                           ADL either performed or assessed with clinical judgement   ADL Overall ADL's : Needs assistance/impaired                                       General ADL Comments: Overall pt requiring min guard for all ADL's, at times needing min A due to balance.     Vision Baseline Vision/History: 0 No visual deficits Ability to See in Adequate Light: 0 Adequate Patient Visual Report: No change from baseline Vision Assessment?: No apparent visual deficits     Perception     Praxis      Pertinent Vitals/Pain Pain Assessment: No/denies pain     Hand Dominance Right   Extremity/Trunk Assessment Upper Extremity Assessment Upper Extremity Assessment: Overall WFL for tasks assessed   Lower Extremity Assessment Lower Extremity Assessment: Defer to  PT evaluation   Cervical / Trunk Assessment Cervical / Trunk Assessment: Normal   Communication Communication Communication: No difficulties   Cognition Arousal/Alertness: Awake/alert Behavior During Therapy: WFL for tasks assessed/performed Overall Cognitive Status: Within Functional Limits for tasks assessed                                       General Comments  Pt's husband and daughter present    Exercises     Shoulder Instructions      Home Living  Family/patient expects to be discharged to:: Private residence Living Arrangements: Spouse/significant other Available Help at Discharge: Family Type of Home: House Home Access: Stairs to enter Technical brewer of Steps: 2 Entrance Stairs-Rails: Right;Left;Can reach both Home Layout: Two level Alternate Level Stairs-Number of Steps: flight Alternate Level Stairs-Rails: Left Bathroom Shower/Tub: Walk-in shower;Tub/shower unit   Bathroom Toilet: Standard     Home Equipment: Cane - single point;Shower seat          Prior Functioning/Environment Prior Level of Function : Independent/Modified Independent             Mobility Comments: Has been having to use cane secondary to new onset symptoms.          OT Problem List: Decreased strength;Decreased activity tolerance;Impaired balance (sitting and/or standing);Decreased safety awareness;Decreased knowledge of use of DME or AE;Impaired sensation      OT Treatment/Interventions: Self-care/ADL training;Therapeutic exercise;Energy conservation;DME and/or AE instruction;Therapeutic activities;Patient/family education;Balance training    OT Goals(Current goals can be found in the care plan section) Acute Rehab OT Goals Patient Stated Goal: To go home OT Goal Formulation: With patient Time For Goal Achievement: 05/05/21 Potential to Achieve Goals: Good ADL Goals Pt Will Perform Grooming: with modified independence;standing Pt Will Perform Lower Body Bathing: with modified independence;sitting/lateral leans;sit to/from stand Pt Will Perform Lower Body Dressing: with modified independence;sitting/lateral leans;sit to/from stand Pt Will Transfer to Toilet: with modified independence;ambulating Pt Will Perform Toileting - Clothing Manipulation and hygiene: with modified independence;sitting/lateral leans;sit to/from stand  OT Frequency: Min 2X/week    Co-evaluation              AM-PAC OT "6 Clicks" Daily Activity      Outcome Measure Help from another person eating meals?: A Little Help from another person taking care of personal grooming?: A Little Help from another person toileting, which includes using toliet, bedpan, or urinal?: A Little Help from another person bathing (including washing, rinsing, drying)?: A Little Help from another person to put on and taking off regular upper body clothing?: A Little Help from another person to put on and taking off regular lower body clothing?: A Little 6 Click Score: 18   End of Session Equipment Utilized During Treatment: Gait belt Nurse Communication: Mobility status  Activity Tolerance: Patient tolerated treatment well Patient left: in bed;with call bell/phone within reach  OT Visit Diagnosis: Unsteadiness on feet (R26.81);Other abnormalities of gait and mobility (R26.89);Muscle weakness (generalized) (M62.81);Other symptoms and signs involving the nervous system (R29.898)                Time: 4098-1191 OT Time Calculation (min): 16 min Charges:  OT General Charges $OT Visit: 1 Visit OT Evaluation $OT Eval Moderate Complexity: 1 Mod  Tamesha Ellerbrock H., OTR/L Acute Rehabilitation  Amyah Clawson Elane Yolanda Bonine 04/21/2021, 7:18 PM

## 2021-04-21 NOTE — Progress Notes (Signed)
°  Transition of Care [facility]) Screening Note   Patient Details  Name: [name] Date of Birth: [birth year]   [facility]) CM/SW Contact:    Pollie Friar, RN Phone Number: 04/21/2021, 11:47 AM    Transition of Care Department [facility]) has reviewed patient. We will continue to monitor patient advancement through interdisciplinary progression rounds. If new patient transition needs arise, please place a TOC consult.

## 2021-04-21 NOTE — Progress Notes (Signed)
Neurology Progress Note  Brief HPI: 46 y.o. female with PMHx of anxiety and bipolar 1 disorder who presented to the ED 1/3 for evaluation of difficulty walking for one week. Patient states that [place] around the mall with her [place] on December 28 she suddenly had trouble with walking with progressive worsening to where she is holding onto walls and requiring a cane for ambulation. She also complained of left upper and lower extremity numbness and neck pain with onset around the same time. MRI brain, cervical spine, and thoracic spine were obtained revealing enhancing lesions concerning for active demyelination and patient was started on Solu-Medrol for management. On further interview, patient states that her friends have noted that she has been walking abnormally for quite some time now but the patient states that she was unaware.  Subjective: No acute overnight events. Today is day 2/5 of Solu-Medrol therapy.  Patient states that she no longer has numbness of the left upper and lower extremities but that they still feel "heavy" from weakness. She also endorses resolution of her neck pain.  Patient states that she feels her imbalance has improved since presentation to the hospital.   Exam: Vitals:   04/21/21 0402 04/21/21 0750  BP: (!) 105/57 110/76  Pulse: (!) 55 87  Resp: 17 20  Temp: 97.7 F (36.5 C) 97.7 F (36.5 C)  SpO2: 95% 98%   Gen: Sitting up in bed, in no acute distress Resp: non-labored breathing, no respiratory distress Abd: soft, non-tender, non-distended  Neuro: Mental Status: Awake, alert, and oriented x 4.  Speech is intact and fluent without dysarthria, no aphasia or neglect is noted.  Good attention noted.  Cranial Nerves: PERRL, no visual field deficits, EOMI without gaze preference, ptosis, or nystagmus, facial sensation to light touch is intact and symmetric, face is symmetric resting and smiling, hearing is intact to voice, tongue is midline Motor:  Bilateral upper extremities with 5/5 strength With hip flexion patient has 4/5 strength with 4+/5 strength at the knees and ankles bilaterally.  Sensory:Intact and symmetric to light touch throughout. Patient states that on her left upper and lower extremities that sensation to light touch is intact but she does have a "heavy" sensation that is persistent.  DTR: 1+ patellae, 2+ biceps Gait: Deferred  Pertinent Labs: CBC    Component Value Date/Time   WBC 12.8 (H) 04/21/2021 0224   RBC 4.43 04/21/2021 0224   HGB 14.1 04/21/2021 0224   HCT 41.0 04/21/2021 0224   PLT 267 04/21/2021 0224   MCV 92.6 04/21/2021 0224   MCH 31.8 04/21/2021 0224   MCHC 34.4 04/21/2021 0224   RDW 12.0 04/21/2021 0224   LYMPHSABS 1.4 04/19/2021 1535   MONOABS 0.4 04/19/2021 1535   EOSABS 0.0 04/19/2021 1535   BASOSABS 0.0 04/19/2021 1535   CMP     Component Value Date/Time   NA 135 04/21/2021 0224   K 4.0 04/21/2021 0224   CL 107 04/21/2021 0224   CO2 21 (L) 04/21/2021 0224   GLUCOSE 189 (H) 04/21/2021 0224   BUN 10 04/21/2021 0224   CREATININE 0.94 04/21/2021 0224   CALCIUM 10.8 (H) 04/21/2021 0224   PROT 7.4 04/19/2021 1535   ALBUMIN 4.2 04/19/2021 1535   AST 23 04/19/2021 1535   ALT 18 04/19/2021 1535   ALKPHOS 76 04/19/2021 1535   BILITOT 1.0 04/19/2021 1535   GFRNONAA >60 04/21/2021 0224   GFRAA >90 12/27/2013 1827   Imaging Reviewed:  MRI brain w contrast 1/4:  Lesions within the right centrum semiovale, left corona radiata and within the genu of the corpus callosum demonstrate corresponding enhancement. Findings likely reflect sites of active demyelination.  MR thoracic spine wwo contrast 1/4: - Multiple short-segment T2 STIR hyperintense lesions within the thoracic spinal cord, as described and highly suspicious for a demyelinating process (such as multiple sclerosis). Spinal cord lesions demonstrate corresponding enhancement at the T1, T7, T8, T9, T10 and T11 levels and these likely  reflect sites of active demyelination. - Thoracic spondylosis, as described. Most notably, disc protrusions contact and mildly flatten the ventral spinal cord at T6-T7, T7-T8 and T10-T11. However, the dorsal CSF space is maintained within the spinal canal at these levels. - Small bilateral pleural effusions.  MRI cervical spine wwo contrast 1/4: - Multiple short-segment T2 STIR hyperintense lesions within the cervicomedullary junction and cervical spinal cord, as described. Findings are highly suspicious for a demyelinating process (such as multiple sclerosis). Spinal cord lesions demonstrate corresponding enhancement at the C2-C3, C5 and C6 levels and these likely reflect sites of active demyelination. - Cervical spondylosis, as described. No more than mild spinal canal stenosis or neural foraminal narrowing. - Straightening of the expected cervical lordosis. - Redemonstrated 1.9 cm left thyroid lobe nodule. A nonemergent thyroid ultrasound is recommended for further evaluation.  Assessment: 46 y.o. female who presented to the ED 1/3 with difficulty walking for the last week. Her neurologic examination is notable for BL lower extremity weakness along with left sided numbness that has since improved. Given her MRI Brain w/o contrast findings in addition to her MRI cervical and thoracic spine findings, there is high concern for a demyelinating disease including MS, NMO (pending serum labs).   Recommendations: - Follow up on pending labs NMO panel, serum Mog Ab - Continue Solu-Medrol + PPI last dose 5/8 - Neurology will follow - Will need outpatient neurology follow up with Dr. Felecia Shelling at [facility], AGACNP-BC [facility] [phone]   Neurology Attending Attestation   I examined the patient and discussed plan with Ms. Toberman NP. Above note has been edited by me to reflect my findings and recommendations. Patient has actually already had significant improvement on high  dose steroids and was able to walk around her room today. Based on personal review of her MR imaging, she has highly active disease and will benefit from high-efficacy DMARD (to be arranged in clinic after discharge). Will also check vit D, JC virus, other baseline DMARD labs.   Su Monks, MD [facility] [phone]   If 7pm- 7am, please page neurology on call as listed in [place].

## 2021-04-21 NOTE — Hospital Course (Addendum)
° °  Name: Valisa Karpel MRN: [id number] DOB: 10/09/1975 46 y.o. PCP: [name], MD  Date of Admission: 04/19/2021  2:50 PM Date of Discharge:  Attending Physician: Velna Ochs, MD  Discharge Diagnosis: 1. Demyelinating disease of CNS 2. Incidental   Discharge Medications: Allergies as of 04/21/2021   No Known Allergies   Med Rec must be completed prior to using this [facility]***       Disposition and follow-up:   Ms.Milan Bertoni was discharged from [facility] in {DISCHARGE CONDITION:19696} condition.  At the hospital follow up visit please address:  1.  ***  2.  Labs / imaging needed at time of follow-up: ***  3.  Pending labs/ test needing follow-up: ***  Follow-up Appointments:   Hospital Course by problem list: #Demyelinating disease of central nervous system  Discharge Exam:   BP 117/85 (BP Location: Left Arm)    Pulse 78    Temp 97.6 F (36.4 C) (Oral)    Resp 19    Wt 97 kg    SpO2 99%    BMI 35.04 kg/m  Discharge exam:  Constitutional: well-appearing *** sitting in ***, in no acute distress HENT: normocephalic atraumatic, mucous membranes moist Eyes: conjunctiva non-erythematous Neck: supple, JVD?? Cardiovascular: ****** rate and rhythm, no m/r/g Pulmonary/Chest: **** work of breathing on room air, lungs clear to auscultation bilaterally Abdominal: soft, non-tender, non-distended MSK: normal bulk and tone Neurological: alert & oriented x 3, 5/5 strength in bilateral upper and lower extremities, normal gait Skin: warm and dry Psych: ***   Pertinent Labs, Studies, and Procedures:  ***  Discharge Instructions:   Signed: Christiana Fuchs, DO 04/21/2021, 2:10 PM   Pager: @MYPAGER @    Comfortable going back to work Thursday - do a work note

## 2021-04-21 NOTE — Progress Notes (Addendum)
HD#1 SUBJECTIVE:  Patient Summary: [name] is a 46 y.o. with a pertinent PMH of generalized anxiety disorder, tobacco use disorder and bipolar I disorder , who presented with bilateral lower extremity weakness and gait instability and admitted for demyelinating disease.   Overnight Events: none  Interim History: Patient assessed at bedside this AM.  She states that her weakness as improved overall. Denies vision changes. She states that her friends have told her that she has been walking funny for several weeks.  OBJECTIVE:  Vital Signs: Vitals:   04/20/21 2023 04/20/21 2351 04/21/21 0402 04/21/21 0500  BP: 123/71 117/67 (!) 105/57   Pulse: 64 66 (!) 55   Resp: 18 17 17    Temp: 97.8 F (36.6 C) 97.6 F (36.4 C) 97.7 F (36.5 C)   TempSrc: Oral Oral Oral   SpO2: 100% 96% 95%   Weight:    97 kg   Supplemental O2: Room Air SpO2: 95 %  Filed Weights   04/21/21 0500  Weight: 97 kg     Intake/Output Summary (Last 24 hours) at 04/21/2021 0620 Last data filed at 04/20/2021 2158 Gross per 24 hour  Intake 53 ml  Output --  Net 53 ml   Net IO Since Admission: 53 mL [04/21/21 0620]  Physical Exam: Constitutional: well-appearing, well-developed sitting in bed, in no acute distress HENT: normocephalic atraumatic, mucous membranes moist Eyes: conjunctiva non-erythematous Neck: supple Cardiovascular: regular rate and rhythm, no m/r/g Pulmonary/Chest: normal work of breathing on room air, lungs clear to auscultation bilaterally Abdominal: soft, non-tender, non-distended MSK: normal bulk and tone Neurological: alert & oriented x 3, 5/5 RUE, 5/5 LUE, 4/5 strength bilateral LE, abnormal finger to nose test L>R Skin: warm and dry Psych: normal mood and affect  Patient Lines/Drains/Airways Status     Active Line/Drains/Airways     Name Placement date Placement time Site Days   Peripheral IV 04/19/21 20 G Right Antecubital 04/19/21  1851  Antecubital  2             Pertinent Labs: CBC Latest Ref Rng & Units 04/21/2021 04/19/2021 12/27/2013  WBC 4.0 - 10.5 K/uL 12.8(H) 7.8 7.7  Hemoglobin 12.0 - 15.0 g/dL 14.1 14.5 14.5  Hematocrit 36.0 - 46.0 % 41.0 41.7 39.8  Platelets 150 - 400 K/uL 267 250 188    CMP Latest Ref Rng & Units 04/21/2021 04/19/2021 04/19/2021  Glucose 70 - 99 mg/dL 189(H) - 82  BUN 6 - 20 mg/dL 10 - 8  Creatinine 0.44 - 1.00 mg/dL 0.94 - 0.83  Sodium 135 - 145 mmol/L 135 - 136  Potassium 3.5 - 5.1 mmol/L 4.0 4.7 6.4(HH)  Chloride 98 - 111 mmol/L 107 - 107  CO2 22 - 32 mmol/L 21(L) - 24  Calcium 8.9 - 10.3 mg/dL 10.8(H) - 10.3  Total Protein 6.5 - 8.1 g/dL - - 7.4  Total Bilirubin 0.3 - 1.2 mg/dL - - 1.0  Alkaline Phos 38 - 126 U/L - - 76  AST 15 - 41 U/L - - 23  ALT 0 - 44 U/L - - 18    No results for input(s): GLUCAP in the last 72 hours.   Pertinent Imaging: MR BRAIN W CONTRAST  Result Date: 04/20/2021 CLINICAL DATA:  Provided history: Ataxia, nontraumatic, stroke excluded. Additional history provided: Patient with sudden onset lower extremity weakness. EXAM: MRI HEAD WITH CONTRAST TECHNIQUE: Multiplanar, multiecho pulse sequences of the brain and surrounding structures were obtained with intravenous contrast. CONTRAST:  45mL GADAVIST GADOBUTROL 1  MMOL/ML IV SOLN FINDINGS: A contrast-enhanced brain MRI was performed as a follow-up to the recent non-contrast brain MRI of 04/19/2021. The following sequences were acquired for the current examination: Pre contrast axial T1 weighted sequence, postcontrast axial T1 weighted sequence, postcontrast coronal T1 weighted sequence, sagittal postcontrast T1 weighted sequence, coronal postcontrast T2 weighted sequence. Corresponding enhancement is present at sites of the previously described T2 FLAIR hyperintense white matter lesions within the right centrum semiovale (series 7, image 37), left corona radiata (series 7, image 35) and genu of corpus callosum (series 7, image 30). IMPRESSION: Lesions  within the right centrum semiovale, left corona radiata and within the genu of the corpus callosum demonstrate corresponding enhancement. Findings likely reflect sites of active demyelination. Electronically Signed   By: Kellie Simmering D.O.   On: 04/20/2021 08:06   MR Cervical Spine W or Wo Contrast  Result Date: 04/20/2021 CLINICAL DATA:  Ataxia, nontraumatic, cervical pathology suspected. EXAM: MRI CERVICAL SPINE WITHOUT AND WITH CONTRAST TECHNIQUE: Multiplanar and multiecho pulse sequences of the cervical spine, to include the craniocervical junction and cervicothoracic junction, were obtained without and with intravenous contrast. CONTRAST:  34mL GADAVIST GADOBUTROL 1 MMOL/ML IV SOLN COMPARISON:  CT angiogram head/neck 04/19/2021. FINDINGS: Alignment: Straightening of the expected cervical lordosis. No significant spondylolisthesis. Vertebrae: Vertebral body height is maintained. No significant marrow edema or focal suspicious osseous lesion. Cord: There are multiple short-segment T2 STIR hyperintense lesions within the cervicomedullary junction and cervical spinal cord, most prominent within the left dorsolateral aspect of the cervicomedullary junction, within the central and right aspect of the spinal cord at the C1-C2 level, within the dorsal aspect of the spinal cord at the C2 level, within the central and right aspect of the spinal cord at the C2 level, within the bilateral cord at the C2-C3 level, within the bilateral cord at the C3-C4 level, within the right aspect of the cord at the C4 level, within the bilateral cord at the C5 level, within the left dorsal lateral aspect of the cord at the C6 level, within the bilateral cord at the C7 level and within the dorsal aspect of the cord at the inferior C7 level. Spinal cord lesions demonstrate corresponding enhancement at the C2-C3 level, at the C5 level and at the C6 level. Posterior Fossa, vertebral arteries, paraspinal tissues: Posterior fossa better  assessed on same-day brain MRI. Flow voids preserved within the imaged cervical vertebral arteries. Paraspinal soft tissues unremarkable. Redemonstrated 1.9 cm left thyroid lobe nodule. Disc levels: No more than mild disc degeneration within the cervical spine. C2-C3: No significant disc herniation or stenosis. C3-C4: Disc bulge. Superimposed small central disc protrusion. Bilateral uncovertebral hypertrophy and facet arthrosis. Mild relative spinal canal narrowing (without spinal cord mass effect). Mild right neural foraminal narrowing. C4-C5: Shallow disc bulge. Uncovertebral hypertrophy (predominantly on the right). No significant spinal canal stenosis. Mild right neural foraminal narrowing. C5-C6: Small central disc protrusion. No significant spinal canal or foraminal stenosis. C6-C7: Disc bulge. Superimposed broad-based right center disc protrusion. Uncovertebral hypertrophy (predominantly on the right). Minimal facet arthrosis. Mild relative spinal canal narrowing. The disc protrusion may contact the ventral aspect of the spinal cord. Mild bilateral neural foraminal narrowing. C7-T1: No significant disc herniation or stenosis. IMPRESSION: Multiple short-segment T2 STIR hyperintense lesions within the cervicomedullary junction and cervical spinal cord, as described. Findings are highly suspicious for a demyelinating process (such as multiple sclerosis). Spinal cord lesions demonstrate corresponding enhancement at the C2-C3, C5 and C6 levels and these likely reflect sites  of active demyelination. Cervical spondylosis, as described. No more than mild spinal canal stenosis or neural foraminal narrowing. Straightening of the expected cervical lordosis. Redemonstrated 1.9 cm left thyroid lobe nodule. A nonemergent thyroid ultrasound is recommended for further evaluation. Electronically Signed   By: Kellie Simmering D.O.   On: 04/20/2021 08:30   MR THORACIC SPINE W WO CONTRAST  Result Date: 04/20/2021 CLINICAL DATA:   Ataxia, nontraumatic, thoracic pathology suspected. EXAM: MRI THORACIC WITHOUT AND WITH CONTRAST TECHNIQUE: Multiplanar and multiecho pulse sequences of the thoracic spine were obtained without and with intravenous contrast. CONTRAST:  42mL GADAVIST GADOBUTROL 1 MMOL/ML IV SOLN COMPARISON:  None. FINDINGS: Mild intermittent motion degradation, limiting evaluation. Alignment: No significant spondylolisthesis. Vertebrae: Vertebral body height is maintained. No significant marrow edema or focal suspicious osseous lesion. Hemangioma within the T12 vertebral body. Cord: There are multiple short-segment T2 STIR hyperintense lesions within the thoracic spinal cord, most prominent within the left aspect of the spinal cord at the T1-T2 level, within the central and right aspect of the spinal cord at the T4-T5 level, within the central and right aspect of the spinal cord at the T5-T6 level, within the left aspect of the spinal cord at the T7 level, within the central and right ventrolateral aspect of the spinal cord at the T8-T9 level, within the dorsal and right aspect of the spinal cord at the T10-T11 level and within the dorsal aspect of the spinal cord at the T12 level. Spinal cord lesions demonstrate corresponding enhancement at the T1 level, T7 level, T8 level, T9 level, T10 level and T11 level. Paraspinal and other soft tissues: Small bilateral layering pleural effusions. No abnormality identified within included portions of the upper abdomen/retroperitoneum. Disc levels: No more than mild disc degeneration within the thoracic spine. Multilevel disc bulges. Mild facet arthrosis within the lower thoracic spine. Additional notable level by level findings as follows. At T6-T7, there is a broad-based left center disc protrusion. The disc protrusion focally effaces the ventral thecal sac, contacting and mildly flattening the left ventral aspect of the spinal cord. However, the dorsal CSF space is maintained within the spinal  canal. At T7-T8, there is a broad-based left center disc protrusion. The disc protrusion focally effaces the ventral thecal sac, contacting and mildly flattening the left ventral aspect of the spinal cord. However, the dorsal CSF space is maintained within the spinal canal. At T8-T9, there is a shallow broad-based right center disc protrusion which results in minimal focal effacement of the ventral thecal sac (without significant spinal cord mass effect). At T10-T11, there is a broad-based central disc protrusion with associated endplate spurring. The disc protrusion contacts and mildly flattens the ventral aspect of the spinal cord. However, the dorsal CSF space is maintained within the spinal canal. No significant foraminal stenosis within the thoracic spine. IMPRESSION: Multiple short-segment T2 STIR hyperintense lesions within the thoracic spinal cord, as described and highly suspicious for a demyelinating process (such as multiple sclerosis). Spinal cord lesions demonstrate corresponding enhancement at the T1, T7, T8, T9, T10 and T11 levels and these likely reflect sites of active demyelination. Thoracic spondylosis, as described. Most notably, disc protrusions contact and mildly flatten the ventral spinal cord at T6-T7, T7-T8 and T10-T11. However, the dorsal CSF space is maintained within the spinal canal at these levels. Small bilateral pleural effusions. Electronically Signed   By: Kellie Simmering D.O.   On: 04/20/2021 08:19    ASSESSMENT/PLAN:  Assessment: Principal Problem:   Demyelinating disease of central  nervous system ([place])  Raymona Dingee is a 46 y.o. with a pertinent PMH of generalized anxiety disorder, tobacco use disorder and bipolar I disorder , who presented with bilateral lower extremity weakness and gait instability and admitted for demyelinating disease on hospital day 1.   Plan: #Demyelinating disease of CNS Concerning for MS vs. NMO. Responding well to IV solumedrol, weakness is  improving.  - Neurology consulted, appreciate recommendations. - IV solumedrol daily through 01/08 for 5 days total, daily protonix  - F/u serum NMO panel and Mog Ab -Vitamin B12 borderline low, follow-up on homocysteine and MMA, MCV and Hgb wnl -PT/OT following, appreciate their recommendations - HIV, RPR pending -WBC elevated in setting of high-dose steroids -trend CBC  #GAD  - Resume home buspar 15mg  tid and xanax 1mg  tid prn   #Bipolar I disorder - Resume home lamictal 200mg  qHS and risperidone 5mg  qHS    #Thyroid nodule - 1.9cm left thyroid lobe nodule noted on MRI. TSH wnl.  - Thyroid US pending  #Tobacco use disorder -Patient currently vapes tobacco, up to 3 g daily. -She is not interested in cessation at this time. Has stopped during pregnancy.   Best Practice: Diet: Regular diet IVF: Fluids: None, Rate: None VTE: rivaroxaban (XARELTO) tablet 10 mg Start: 04/20/21 1000 Code: Full AB: None Therapy Recs: Pending DME: rolling walker Family Contact: Patient states she will update husband DISPO: Anticipated discharge in 3 days to Home pending improvement in neurological symptoms and completion of IV steroids.  Signature: Daleen Bo. Addilee Neu, D.O.  Internal Medicine Resident, PGY-1 [facility]  Pager: [phone] 6:20 AM, 04/21/2021   Please contact the on call pager after 5 pm and on weekends at [phone].

## 2021-04-22 DIAGNOSIS — G379 Demyelinating disease of central nervous system, unspecified: Secondary | ICD-10-CM | POA: Diagnosis not present

## 2021-04-22 DIAGNOSIS — R9389 Abnormal findings on diagnostic imaging of other specified body structures: Secondary | ICD-10-CM

## 2021-04-22 LAB — BASIC METABOLIC PANEL
Anion gap: 5 (ref 5–15)
BUN: 17 mg/dL (ref 6–20)
CO2: 25 mmol/L (ref 22–32)
Calcium: 10.1 mg/dL (ref 8.9–10.3)
Chloride: 106 mmol/L (ref 98–111)
Creatinine, Ser: 0.87 mg/dL (ref 0.44–1.00)
GFR, Estimated: >60 mL/min (ref >60)
Glucose, Bld: 174 mg/dL — ABNORMAL HIGH (ref 70–99)
Potassium: 4.3 mmol/L (ref 3.5–5.1)
Sodium: 136 mmol/L (ref 135–145)

## 2021-04-22 LAB — CBC
HCT: 39.4 % (ref 36.0–46.0)
Hemoglobin: 13.4 g/dL (ref 12.0–15.0)
MCH: 31.9 pg (ref 26.0–34.0)
MCHC: 34 g/dL (ref 30.0–36.0)
MCV: 93.8 fL (ref 80.0–100.0)
Platelets: 240 10*3/uL (ref 150–400)
RBC: 4.2 MIL/uL (ref 3.87–5.11)
RDW: 12.2 % (ref 11.5–15.5)
WBC: 15.4 10*3/uL — ABNORMAL HIGH (ref 4.0–10.5)
nRBC: 0 % (ref 0.0–0.2)

## 2021-04-22 LAB — HOMOCYSTEINE: Homocysteine: 14.7 umol/L — ABNORMAL HIGH (ref 0.0–14.5)

## 2021-04-22 LAB — MISC LABCORP TEST (SEND OUT): Labcorp test code: 505310

## 2021-04-22 LAB — NEUROMYELITIS OPTICA AUTOAB, IGG: NMO-IgG: 1.5 U/mL (ref 0.0–3.0)

## 2021-04-22 NOTE — Progress Notes (Signed)
04/22/21 1428  PT Visit Information  Assistance Needed +1  History of Present Illness Pt is a 46 y/o female admitted secondary to BLE weakness (L extremities>R extremities). MRI showed spinal cord lesions demonstrate corresponding  enhancement at the C2-C3, C5 and C6 levels, T1, T7, T8, T9,  T10 and T11 levels, and lesions within the right centrum semiovale, left corona radiata and  within the genu of the corpus callosum demonstrate corresponding  enhancement. PMH includes bipolar disorder  Subjective Data  Patient Stated Goal to go home  Precautions  Precautions Fall  Restrictions  Weight Bearing Restrictions No  Pain Assessment  Pain Assessment Faces  Faces Pain Scale 2  Pain Location LLE  Pain Descriptors / Indicators Constant  Pain Intervention(s) Monitored during session;Limited activity within patient's tolerance;Repositioned  Cognition  Arousal/Alertness Awake/alert  Behavior During Therapy WFL for tasks assessed/performed  Overall Cognitive Status Within Functional Limits for tasks assessed  Bed Mobility  Overal bed mobility Needs Assistance  Bed Mobility Supine to Sit;Sit to Supine  Supine to sit Supervision  Sit to supine Supervision  General bed mobility comments S for safety  Transfers  Overall transfer level Needs assistance  Equipment used None  Transfers Sit to/from Stand  Sit to Stand Min guard  General transfer comment Min G to steady  Ambulation/Gait  Ambulation/Gait assistance Min assist;Min guard  Gait Distance (Feet) 50 Feet  Assistive device Straight cane  Gait Pattern/deviations Step-through pattern;Decreased stride length  General Gait Details LOB X3-4 secondary to LLE weakness. Min to min guard A for steadying. Required standing rest X2. Educated about using RW for increased stability.  Gait velocity Decreased  Balance  Overall balance assessment Needs assistance  Sitting-balance support No upper extremity supported;Feet supported  Sitting  balance-Leahy Scale Good  Standing balance support Single extremity supported;During functional activity  Standing balance-Leahy Scale Poor  Standing balance comment Reliant on at least 1 UE support  PT - End of Session  Equipment Utilized During Treatment Gait belt  Activity Tolerance Patient tolerated treatment well  Patient left in bed;with call bell/phone within reach;with family/visitor present  Nurse Communication Mobility status   PT - Assessment/Plan  PT Plan Current plan remains appropriate  PT Visit Diagnosis Unsteadiness on feet (R26.81);Muscle weakness (generalized) (M62.81);Difficulty in walking, not elsewhere classified (R26.2)  PT Frequency (ACUTE ONLY) Min 3X/week  Follow Up Recommendations Outpatient PT (neuro outpatient)  Assistance recommended at discharge Intermittent Supervision/Assistance  Patient can return home with the following A little help with walking and/or transfers;A little help with bathing/dressing/bathroom;Assistance with cooking/housework;Help with stairs or ramp for entrance  PT equipment Rolling walker (2 wheels)  AM-PAC PT "6 Clicks" Mobility Outcome Measure (Version 2)  Help needed turning from your back to your side while in a flat bed without using bedrails? 4  Help needed moving from lying on your back to sitting on the side of a flat bed without using bedrails? 4  Help needed moving to and from a bed to a chair (including a wheelchair)? 3  Help needed standing up from a chair using your arms (e.g., wheelchair or bedside chair)? 3  Help needed to walk in hospital room? 3  Help needed climbing 3-5 steps with a railing?  2  6 Click Score 19  Consider Recommendation of Discharge To: Home with [facility]  Progressive Mobility  What is the highest level of mobility based on the progressive mobility assessment? Level 5 (Walks with assist in room/hall) - Balance while stepping forward/back and can  walk in room with assist - Complete  Mobility Ambulated with  assistance in hallway  PT Goal Progression  Progress towards PT goals Progressing toward goals  Acute Rehab PT Goals  PT Goal Formulation With patient/family  Time For Goal Achievement 05/04/21  Potential to Achieve Goals Good  PT Time Calculation  PT Start Time (ACUTE ONLY) 0919  PT Stop Time (ACUTE ONLY) 0933  PT Time Calculation (min) (ACUTE ONLY) 14 min  PT General Charges  $$ ACUTE PT VISIT 1 Visit  PT Treatments  $Gait Training 8-22 mins   Pt progressing towards goals. Pt with improvement in mobility, however, remains unsteady and required up to min A for steadying. Educated about using RW at home to increase safety. Recommendations for neuro outpatient remain appropriate. Will continue to follow acutely.   Reuel Derby, PT, DPT  Acute Rehabilitation Services  Pager: [phone] Office: [phone]

## 2021-04-22 NOTE — Discharge Summary (Addendum)
Name: [name] MRN: [id number] DOB: December 10, 1975 46 y.o. PCP: [name], MD  Date of Admission: 04/19/2021  2:50 PM Date of Discharge: 04/24/2020 Attending Physician: Velna Ochs, MD  Discharge Diagnosis: 1. 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 Incidental nodule, possible parathyroid hypertrophy vs adenoma (pertinent hx of Grave's disease, "I had radiation") 3.  Positive hep C antibody (patient reports hx of treated Hepatitis C)  Chronic: 3. Bipolar 1 disorder 4. Generalized Anxiety Disorder 5. Tobacco use disorder  Discharge Medications: Allergies as of 04/24/2021   No Known Allergies      Medication List     TAKE these medications    ALPRAZolam 1 MG tablet Commonly known as: XANAX One po tid prn. What changed:  how much to take how to take this when to take this   busPIRone 15 MG tablet Commonly known as: BUSPAR TAKE 2 TABLETS BY MOUTH EVERY MORNING AND 1 TABLET BY MOUTH EVERY EVENING What changed:  how much to take how to take this when to take this   ibuprofen 200 MG tablet Commonly known as: ADVIL Take 200 mg by mouth every 6 (six) hours as needed for headache or mild pain.   lamoTRIgine 200 MG tablet Commonly known as: LAMICTAL TAKE 1 TABLET AT BEDTIME What changed: when to take this   risperidone 4 MG tablet Commonly known as: RISPERDAL TAKE 1 TABLET AT BEDTIME What changed: when to take this   Vitamin D (Ergocalciferol) 1.25 MG (50000 UNIT) Caps capsule Commonly known as: DRISDOL Take 1 capsule (50,000 Units total) by mouth every 7 (seven) days. Start taking on: April 30, 2021               Durable Medical Equipment  (From admission, onward)           Start     Ordered   04/22/21 1516  For home use only DME Walker rolling  Once       Question Answer Comment  Walker: With [place] Wheels   Patient needs a walker to treat with the following condition Multiple sclerosis ([place])      04/22/21 1516             Disposition and follow-up:   [name] was discharged from [facility] in Stable condition.  At the hospital follow up visit please address:  1.   Demyelinating disease -Follow-up with neurology in 4 weeks -Follow-up on DMARD baseline labs  Thyroid nodule Appears to be a benign and not reactive nodule.  PTH was within normal limits. -Consider repeat thyroid ultrasound in a few months to reevaluate  Vitamin D deficiency Vitamin D level was low at 10.  High-dose vitamin D 50 units weekly was started. -Recheck vitamin D level in 6-12 weeks  Positive hepatitis C antibody Hepatitis C antibody came back positive.  She report finished treatment for hep C is in the past. -Follow-up on hepatitis C viral load  2.  Labs / imaging needed at time of follow-up: NA  3.  Pending labs/ test needing follow-up: QuantiFERON TB, JC virus DNA PCR, MMA, hep C RNA quant.  Follow-up Appointments:  Follow-up Information     [facility]. Schedule an appointment as soon as possible for a visit in 4 week(s).   Contact information: [address]     [address] [place] [address] [phone]               Dr. 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  She stated that her friends told her that she had been walking abnormally for a little bit. Physical exam with bilateral lower extremity weakness along with left sided numbness. Neurology consulted and MRI ordered. This showed hyperintense lesions within the right centrum semiovale, left corona radiata, within the genu of the corpus callosum, segments along T2.  Neuromyelitis optica autoantibody came back negative which favor multiple sclerosis.  Patient was  started on high-dose IV steroids for 5 days.  Her symptoms improved with steroids and she completed course on 01/08. PT/ OT evaluated patient with recommendations for outpatient neuro rehab.  Patient will follow up with neurology outpatient in 4 weeks.  They will consider starting her on DMARD.  We obtained baseline labs in the hospital and will be followed up in the office.   #Incidental nodule, possible parathyroid hypertrophy vs adenoma Incidental 1.9 cm thyroid nodule noted on imaging. TSH within normal limits. Ultrasound showed thyroid ultrasound showed 2.7 cm hypoechoic nodule separate and inferior to left thyroid lobe suggesting parathyroid gland enlargement or adenoma. TSH within normal limits albumin at 4.2 with calcium at 10.1.  PTH was ordered and came back within normal limits.  This suggest that the thyroid nodule is benign and not reactive.  No further work-up at this time.  PCP can repeat thyroid ultrasound in a few months to reassess.   Subjective Patient is seen at bedside.  She is very comfortable in no acute distress.  She report good ambulation and and has been walking up and down the stairs without any difficulty.  She denies any speaking or swallowing issues.  Discharge Exam:   BP 125/78 (BP Location: Left Arm)    Pulse (!) 56    Temp 97.8 F (36.6 C) (Oral)    Resp 14    Wt 98.3 kg    SpO2 97%    BMI 35.51 kg/m  Physical Exam Constitutional:      General: She is not in acute distress.    Appearance: She is not ill-appearing.  HENT:     Head: Normocephalic.  Eyes:     General:        Right eye: No discharge.        Left eye: No discharge.     Conjunctiva/sclera: Conjunctivae normal.  Cardiovascular:     Rate and Rhythm: Normal rate and regular rhythm.  Pulmonary:     Effort: Pulmonary effort is normal. No respiratory distress.     Breath sounds: Normal breath sounds.  Musculoskeletal:        General: Normal range of motion.  Skin:    General: Skin is warm.   Neurological:     Mental Status: She is alert and oriented to person, place, and time.  Psychiatric:        Mood and Affect: Mood normal.        Behavior: Behavior normal.     Pertinent Labs, Studies, and Procedures:  MRI brain 01/04- IMPRESSION: Lesions within the right centrum semiovale, left corona radiata and within the genu of the corpus callosum demonstrate corresponding enhancement. Findings likely reflect sites of active demyelination.  MRI T spine 01/04- IMPRESSION: Multiple short-segment T2 STIR hyperintense lesions within the thoracic spinal cord, as described and highly suspicious for  a demyelinating process (such as multiple sclerosis). Spinal cord lesions demonstrate corresponding enhancement at the T1, T7, T8, T9, T10 and T11 levels and these likely reflect sites of active demyelination. Thoracic spondylosis, as described. Most notably, disc protrusions contact and mildly flatten the ventral spinal cord at T6-T7, T7-T8 and T10-T11. However, the dorsal CSF space is maintained within the spinal canal at these levels.   MRI C-Spine 1/4 IMPRESSION: Multiple short-segment T2 STIR hyperintense lesions within the cervicomedullary junction and cervical spinal cord, as described. Findings are highly suspicious for a demyelinating process (such as multiple sclerosis). Spinal cord lesions demonstrate corresponding enhancement at the C2-C3, C5 and C6 levels and these likely reflect sites of active demyelination.   Cervical spondylosis, as described. No more than mild spinal canal stenosis or neural foraminal narrowing.   Straightening of the expected cervical lordosis.   Redemonstrated 1.9 cm left thyroid lobe nodule. A nonemergent thyroid ultrasound is recommended for further evaluation  Small bilateral pleural effusions  MRI brain w Contrast IMPRESSION: Lesions within the right centrum semiovale, left corona radiata and within the genu of the corpus callosum  demonstrate corresponding enhancement. Findings likely reflect sites of active demyelination.    CTA head and neck IMPRESSION: 1. Normal CTA of the head and neck. No large vessel occlusion, hemodynamically significant stenosis, or other acute vascular abnormality. 2. 1.9 cm left thyroid nodule, indeterminate. Further evaluation with dedicated thyroid ultrasound recommended for further evaluation. This could be performed on a nonemergent outpatient basis. (Ref: J Am Coll Radiol. 2015 Feb;12(2): 143-50). 3. Small layering left pleural effusion, partially visualized  Ultrasound thyroid 01/05- IMPRESSION: No significant thyroid abnormality.   2.7 cm hypoechoic nodule separate and inferior to the left inferior thyroid lobe, consider enlarged parathyroid gland or parathyroid adenoma. Correlate with parathyroid hormone level and calcium level. If there is concern for hyperparathyroidism, consider further evaluation with a nuclear medicine parathyroid scan.   CBC Latest Ref Rng & Units 04/24/2021 04/23/2021 04/22/2021  WBC 4.0 - 10.5 K/uL 10.1 14.1(H) 15.4(H)  Hemoglobin 12.0 - 15.0 g/dL 13.5 12.8 13.4  Hematocrit 36.0 - 46.0 % 39.6 37.4 39.4  Platelets 150 - 400 K/uL 204 215 240   CMP Latest Ref Rng & Units 04/24/2021 04/23/2021 04/23/2021  Glucose 70 - 99 mg/dL 158(H) 195(H) 195(H)  BUN 6 - 20 mg/dL 18 21(H) -  Creatinine 0.44 - 1.00 mg/dL 0.78 0.90 -  Sodium 135 - 145 mmol/L 137 136 -  Potassium 3.5 - 5.1 mmol/L 4.6 5.6(H) -  Chloride 98 - 111 mmol/L 106 105 -  CO2 22 - 32 mmol/L 26 24 -  Calcium 8.9 - 10.3 mg/dL 10.3 9.9 -  Total Protein 6.5 - 8.1 g/dL - 6.1(L) -  Total Bilirubin 0.3 - 1.2 mg/dL - 0.6 -  Alkaline Phos 38 - 126 U/L - 62 -  AST 15 - 41 U/L - 19 -  ALT 0 - 44 U/L - 11 -     Discharge Instructions: Discharge Instructions     Ambulatory referral to Neurology   Complete by: As directed    An appointment is requested in approximately: 4 weeks with Dr. Felecia Shelling.    Ambulatory referral to Occupational Therapy   Complete by: As directed    Ambulatory referral to Physical Therapy   Complete by: As directed    Call MD for:  difficulty breathing, headache or visual disturbances   Complete by: As directed    Call MD for:  severe uncontrolled pain  Complete by: As directed    Diet - low sodium heart healthy   Complete by: As directed    Discharge instructions   Complete by: As directed    Ms. [name],  It was a pleasure taking care of you during this admission.  You were hospitalized for difficulty walking weakness.  We found active demyelinating process of your brain and multiple level of your spinal cord.  A diagnosis of multiple sclerosis is favored at this time.  We have you on IV steroids for 5 days with improvement of your symptoms.  Please follow-up with Dr. Felecia Shelling of [facility] in 4 weeks.  I have placed a referral order.  Your vitamin D level was low so I started you on 50,000 units of vitamin D weekly.  Please make an appointment with your primary care doctor in 1-2 weeks.  Take care,  Dr. Alfonse Spruce   Increase activity slowly   Complete by: As directed        Signed: Gaylan Gerold, DO 04/24/2021, 1:17 PM

## 2021-04-22 NOTE — Progress Notes (Signed)
Neurology Progress Note  Brief HPI: 46 y.o. female with PMHx of anxiety and bipolar 1 disorder who presented to the ED 1/3 for evaluation of difficulty walking for one week. Patient states that [place] the mall with her [place] on [place] 28 she suddenly had trouble with walking with progressive worsening to where she is holding onto walls and requiring a cane for ambulation. She also complained of left upper and lower extremity numbness and neck pain with onset around the same time. MRI brain, cervical spine, and thoracic spine were obtained revealing enhancing lesions concerning for active demyelination and patient was started on Solu-Medrol for management. On further interview, patient states that her friends have noted that she has been walking abnormally for quite some time now but the patient states that she was unaware.  Subjective: No acute overnight events. Today is day 3/5 of Solu-Medrol therapy.  Walking today without a walker  Exam: Vitals:   04/22/21 1455 04/22/21 1943  BP: 115/73 112/67  Pulse: (!) 105 (!) 56  Resp: 18 18  Temp: 97.7 F (36.5 C) 98.1 F (36.7 C)  SpO2: 98% 97%   Gen: Sitting up in bed, in no acute distress Resp: non-labored breathing, no respiratory distress Abd: soft, non-tender, non-distended  Neuro: Mental Status: Awake, alert, and oriented x 4.  Speech is intact and fluent without dysarthria, no aphasia or neglect is noted.  Good attention noted.  Cranial Nerves: PERRL, no visual field deficits, EOMI without gaze preference, ptosis, or nystagmus, facial sensation to light touch is intact and symmetric, face is symmetric resting and smiling, hearing is intact to voice, tongue is midline Motor: Bilateral upper extremities with 5/5 strength With hip flexion patient has 4+/5 strength with 5/5 strength at the knees and ankles bilaterally.  Sensory: SILT DTR: 1+ patellae, 2+ biceps Gait: Deferred  Pertinent Labs: CBC    Component Value  Date/Time   WBC 15.4 (H) 04/22/2021 0119   RBC 4.20 04/22/2021 0119   HGB 13.4 04/22/2021 0119   HCT 39.4 04/22/2021 0119   PLT 240 04/22/2021 0119   MCV 93.8 04/22/2021 0119   MCH 31.9 04/22/2021 0119   MCHC 34.0 04/22/2021 0119   RDW 12.2 04/22/2021 0119   LYMPHSABS 1.4 04/19/2021 1535   MONOABS 0.4 04/19/2021 1535   EOSABS 0.0 04/19/2021 1535   BASOSABS 0.0 04/19/2021 1535   CMP     Component Value Date/Time   NA 136 04/22/2021 0119   K 4.3 04/22/2021 0119   CL 106 04/22/2021 0119   CO2 25 04/22/2021 0119   GLUCOSE 174 (H) 04/22/2021 0119   BUN 17 04/22/2021 0119   CREATININE 0.87 04/22/2021 0119   CALCIUM 10.1 04/22/2021 0119   PROT 7.4 04/19/2021 1535   ALBUMIN 4.2 04/19/2021 1535   AST 23 04/19/2021 1535   ALT 18 04/19/2021 1535   ALKPHOS 76 04/19/2021 1535   BILITOT 1.0 04/19/2021 1535   GFRNONAA >60 04/22/2021 0119   GFRAA >90 12/27/2013 1827   Imaging Reviewed:  MRI brain w contrast 1/4:  Lesions within the right centrum semiovale, left corona radiata and within the genu of the corpus callosum demonstrate corresponding enhancement. Findings likely reflect sites of active demyelination.  MR thoracic spine wwo contrast 1/4: - Multiple short-segment T2 STIR hyperintense lesions within the thoracic spinal cord, as described and highly suspicious for a demyelinating process (such as multiple sclerosis). Spinal cord lesions demonstrate corresponding enhancement at the T1, T7, T8, T9, T10 and T11 levels and these  likely reflect sites of active demyelination. - Thoracic spondylosis, as described. Most notably, disc protrusions contact and mildly flatten the ventral spinal cord at T6-T7, T7-T8 and T10-T11. However, the dorsal CSF space is maintained within the spinal canal at these levels. - Small bilateral pleural effusions.  MRI cervical spine wwo contrast 1/4: - Multiple short-segment T2 STIR hyperintense lesions within the cervicomedullary junction and cervical  spinal cord, as described. Findings are highly suspicious for a demyelinating process (such as multiple sclerosis). Spinal cord lesions demonstrate corresponding enhancement at the C2-C3, C5 and C6 levels and these likely reflect sites of active demyelination. - Cervical spondylosis, as described. No more than mild spinal canal stenosis or neural foraminal narrowing. - Straightening of the expected cervical lordosis. - Redemonstrated 1.9 cm left thyroid lobe nodule. A nonemergent thyroid ultrasound is recommended for further evaluation.  Assessment: 46 y.o. female who presented to the ED 1/3 with difficulty walking for the last week. Her neurologic examination is notable for BL lower extremity weakness along with left sided numbness that has since improved. Given her MRI Brain w/o contrast findings in addition to her MRI cervical and thoracic spine findings, there is high concern for a demyelinating disease. NMO is pending but MS is favored dx based on absence of longitudinally extensive lesions in the spinal cord. Based on personal review of her MR imaging, she has highly active disease and will benefit from high-efficacy DMARD (to be arranged in clinic after discharge).   Recommendations: - Follow up on pending labs NMO panel, serum Mog Ab - Continue Solu-Medrol + PPI last dose 5/8 - Check vit D and baseline DMARD labs: CMP, CBC with diff, hepatitis panel, quantiferon gold, JC virus - Neurology will follow - Will need outpatient neurology follow up with Dr. Felecia Shelling at [place], [place] [facility] [phone]  If [facility], please page neurology on call as listed in [place].

## 2021-04-22 NOTE — Progress Notes (Signed)
Occupational Therapy Treatment Patient Details Name: [name] MRN: [id number] DOB: [birth year] Today's Date: 04/22/2021   History of present illness Pt is a 46 y/o female admitted secondary to BLE weakness (L extremities>R extremities). MRI showed spinal cord lesions demonstrate corresponding  enhancement at the C2-C3, C5 and C6 levels, T1, T7, T8, T9,  T10 and T11 levels, and lesions within the right centrum semiovale, left corona radiata and  within the genu of the corpus callosum demonstrate corresponding  enhancement. PMH includes bipolar disorder   OT comments  Pt progressing towards acute OT goals. Pt reports feeling a bit better today than yesterday specifically in terms of her strength/balance. Able to complete 1 grooming task in standing utilizing sink for external support. Also completed functional mobility in the room at min guard level. Slow, guarded pace and intermittently seeks single UE support. Mild fine motor coordination deficits noted in L hand. Added d/c recommendation of OP PT preferably in neuro rehab venue at d/c.    Recommendations for follow up therapy are one component of a multi-disciplinary discharge planning process, led by the attending physician.  Recommendations may be updated based on patient status, additional functional criteria and insurance authorization.    Follow Up Recommendations  Outpatient OT (OP Neuro Rehab)    Assistance Recommended at Discharge Set up Supervision/Assistance  Patient can return home with the following      Equipment Recommendations  None recommended by OT    Recommendations for Other Services      Precautions / Restrictions Precautions Precautions: Fall Restrictions Weight Bearing Restrictions: No       Mobility Bed Mobility Overal bed mobility: Needs Assistance Bed Mobility: Supine to Sit;Sit to Supine     Supine to sit: Supervision Sit to supine: Supervision   General bed mobility comments: S for safety     Transfers Overall transfer level: Needs assistance Equipment used: None Transfers: Sit to/from Stand Sit to Stand: Min guard           General transfer comment: Min G to steady     Balance Overall balance assessment: Needs assistance Sitting-balance support: No upper extremity supported;Feet supported Sitting balance-Leahy Scale: Good     Standing balance support: Single extremity supported;No upper extremity supported;During functional activity Standing balance-Leahy Scale: Fair Standing balance comment: seeks external support durind dynamic tasks (leans into sink vs single UE support)                           ADL either performed or assessed with clinical judgement   ADL Overall ADL's : Needs assistance/impaired     Grooming: Min guard;Oral care;Standing Grooming Details (indicate cue type and reason): Min guard assist and external support of sink utilized to complete 1 grooming task in standing                             Functional mobility during ADLs: Min guard General ADL Comments: mild unsteadiness noted with functional mobility in the room.    Extremity/Trunk Assessment Upper Extremity Assessment Upper Extremity Assessment: LUE deficits/detail LUE Deficits / Details: mild fine motor deficits LUE Coordination: decreased fine motor   Lower Extremity Assessment Lower Extremity Assessment: Defer to PT evaluation        Vision       Perception     Praxis      Cognition Arousal/Alertness: Awake/alert Behavior During Therapy: [facility] for tasks assessed/performed Overall  Cognitive Status: Within Functional Limits for tasks assessed                                            Exercises     Shoulder Instructions       General Comments      Pertinent Vitals/ Pain       Pain Assessment: Faces Faces Pain Scale: Hurts a little bit Pain Location: LLE Pain Descriptors / Indicators: Constant Pain  Intervention(s): Monitored during session;Limited activity within patient's tolerance;Repositioned;Premedicated before session  Home Living                                          Prior Functioning/Environment              Frequency  Min 2X/week        Progress Toward Goals  OT Goals(current goals can now be found in the care plan section)  Progress towards OT goals: Progressing toward goals  Acute Rehab OT Goals Patient Stated Goal: home, concerned about work. OT Goal Formulation: With patient Time For Goal Achievement: 05/05/21 Potential to Achieve Goals: Good ADL Goals Pt Will Perform Grooming: with modified independence;standing Pt Will Perform Lower Body Bathing: with modified independence;sitting/lateral leans;sit to/from stand Pt Will Perform Lower Body Dressing: with modified independence;sitting/lateral leans;sit to/from stand Pt Will Transfer to Toilet: with modified independence;ambulating Pt Will Perform Toileting - Clothing Manipulation and hygiene: with modified independence;sitting/lateral leans;sit to/from stand  Plan Discharge plan needs to be updated    Co-evaluation                 AM-PAC OT "6 Clicks" Daily Activity     Outcome Measure   Help from another person eating meals?: None Help from another person taking care of personal grooming?: A Little Help from another person toileting, which includes using toliet, bedpan, or urinal?: A Little Help from another person bathing (including washing, rinsing, drying)?: A Little Help from another person to put on and taking off regular upper body clothing?: A Little Help from another person to put on and taking off regular lower body clothing?: A Little 6 Click Score: 19    End of Session    OT Visit Diagnosis: Unsteadiness on feet (R26.81);Other abnormalities of gait and mobility (R26.89);Muscle weakness (generalized) (M62.81);Other symptoms and signs involving the nervous  system (R29.898)   Activity Tolerance Patient tolerated treatment well   Patient Left in bed;with call bell/phone within reach;with bed alarm set   Nurse Communication          Time: 7408-1448 OT Time Calculation (min): 20 min  Charges: OT General Charges $OT Visit: 1 Visit OT Treatments $Self Care/Home Management : 8-22 mins  Tyrone Schimke, OT Acute Rehabilitation Services Office: [phone]   Hortencia Pilar 04/22/2021, 12:19 PM

## 2021-04-22 NOTE — TOC Initial Note (Signed)
Transition of Care [facility]) - Initial/Assessment Note    Patient Details  Name: [name] MRN: [id number] Date of Birth: 1975-08-20  [facility] CM/SW Contact:    Pollie Friar, RN Phone Number: 04/22/2021, 3:28 PM  Clinical Narrative:                 Patient is from home with her spouse. She currently still works and plans on returning after discharge.  She denies any issues with home medications or transportation.  Walker for home to be delivered to the room per [employer].  Outpatient therapy arranged through [facility]. Information on the AVS. Orders faxed to [place]: [phone] ToC following.  Expected Discharge Plan: OP Rehab Barriers to Discharge: Continued Medical Work up   Patient Goals and CMS Choice     Choice offered to / list presented to : Patient  Expected Discharge Plan and Services Expected Discharge Plan: OP Rehab   Discharge Planning Services: CM Consult Post Acute Care Choice: Durable Medical Equipment Living arrangements for the past 2 months: Single Family Home                 DME Arranged: Walker rolling DME Agency: [employer] Date DME Agency Contacted: 04/22/21   Representative spoke with at DME Agency: Freda Munro            Prior Living Arrangements/Services Living arrangements for the past 2 months: Single Family Home Lives with:: Spouse Patient language and need for interpreter reviewed:: Yes Do you feel safe going back to the place where you live?: Yes          Current home services: DME (cane) Criminal Activity/Legal Involvement Pertinent to Current Situation/Hospitalization: No - Comment as needed  Activities of Daily Living      Permission Sought/Granted                  Emotional Assessment Appearance:: Appears stated age Attitude/Demeanor/Rapport: Engaged Affect (typically observed): Accepting Orientation: : Oriented to Self, Oriented to Place, Oriented to  Time, Oriented to  Situation   Psych Involvement: No (comment)  Admission diagnosis:  Ataxia [R27.0] Demyelinating disease of central nervous system ([place]) [G37.9] Demyelinating disease ([place]) [G37.9] Patient Active Problem List   Diagnosis Date Noted   Abnormal ultrasound of parathyroid gland    Thyroid nodule greater than or equal to 1 cm in diameter incidentally noted on imaging study    Demyelinating disease of central nervous system ([place]) 04/20/2021   HYPERTHYROIDISM 02/14/2006   HYPERTENSION 02/14/2006   HEPATITIS C 01/11/2006   GRAVE'S DISEASE 01/11/2006   BIPOLAR AFFECTIVE DISORDER, HX OF 01/11/2006   PCP:  [name], MD Pharmacy:   [facility] [address] [place] [address] Phone: [phone] Fax: [phone]     Social Determinants of Health (SDOH) Interventions    Readmission Risk Interventions No flowsheet data found.

## 2021-04-22 NOTE — Progress Notes (Addendum)
HD#2 SUBJECTIVE:  Patient Summary: [name] is a 46 y.o. with a pertinent PMH of generalized anxiety disorder, tobacco use disorder and bipolar I disorder , who presented with bilateral lower extremity weakness and gait instability and admitted for demyelinating disease.   Overnight Events: none  Interim History: Patient assessed at bedside this AM. She endorses continued pain in left lower leg that is about the same since last week. She is not having any problems with the steroid tx she is on. Her numbness and weakness have improved. There are no other complaints or concerns at this time.   OBJECTIVE:  Vital Signs: Vitals:   04/22/21 0351 04/22/21 0500 04/22/21 0811 04/22/21 0813  BP: 106/73  (!) 98/58 105/62  Pulse: 63  64   Resp: 18  14   Temp: 97.8 F (36.6 C)  97.7 F (36.5 C)   TempSrc: Oral  Oral   SpO2: 100%  96%   Weight:  96.8 kg     Supplemental O2: Room Air SpO2: 96 %  Filed Weights   04/21/21 0500 04/22/21 0500  Weight: 97 kg 96.8 kg     Intake/Output Summary (Last 24 hours) at 04/22/2021 1002 Last data filed at 04/22/2021 0800 Gross per 24 hour  Intake 660 ml  Output --  Net 660 ml    Net IO Since Admission: 953 mL [04/22/21 1002]  Physical Exam: Constitutional: well-appearing, well-developed sitting in bed, in no acute distress HENT: normocephalic atraumatic, mucous membranes moist Eyes: conjunctiva non-erythematous Neck: supple Cardiovascular: regular rate and rhythm, no m/r/g Pulmonary/Chest: normal work of breathing on room air, lungs clear to auscultation bilaterally Abdominal: soft, non-tender, non-distended MSK: normal bulk and tone Neurological: alert & oriented x 3, 5/5 RUE, 5/5 LUE, 4/5 strength bilateral LE, abnormal finger to nose test L>R Skin: warm and dry Psych: normal mood and affect  Patient Lines/Drains/Airways Status     Active Line/Drains/Airways     Name Placement date Placement time Site Days   Peripheral IV 04/19/21  20 G Right Antecubital 04/19/21  1851  Antecubital  2            Pertinent Labs: CBC Latest Ref Rng & Units 04/22/2021 04/21/2021 04/19/2021  WBC 4.0 - 10.5 K/uL 15.4(H) 12.8(H) 7.8  Hemoglobin 12.0 - 15.0 g/dL 13.4 14.1 14.5  Hematocrit 36.0 - 46.0 % 39.4 41.0 41.7  Platelets 150 - 400 K/uL 240 267 250    CMP Latest Ref Rng & Units 04/22/2021 04/21/2021 04/19/2021  Glucose 70 - 99 mg/dL 174(H) 189(H) -  BUN 6 - 20 mg/dL 17 10 -  Creatinine 0.44 - 1.00 mg/dL 0.87 0.94 -  Sodium 135 - 145 mmol/L 136 135 -  Potassium 3.5 - 5.1 mmol/L 4.3 4.0 4.7  Chloride 98 - 111 mmol/L 106 107 -  CO2 22 - 32 mmol/L 25 21(L) -  Calcium 8.9 - 10.3 mg/dL 10.1 10.8(H) -  Total Protein 6.5 - 8.1 g/dL - - -  Total Bilirubin 0.3 - 1.2 mg/dL - - -  Alkaline Phos 38 - 126 U/L - - -  AST 15 - 41 U/L - - -  ALT 0 - 44 U/L - - -    No results for input(s): GLUCAP in the last 72 hours.   Pertinent Imaging: US THYROID  Result Date: 04/21/2021 CLINICAL DATA:  Left inferior thyroid nodule by CTA neck 04/19/2021 EXAM: THYROID ULTRASOUND TECHNIQUE: Ultrasound examination of the thyroid gland and adjacent soft tissues was performed. COMPARISON:  04/19/2021  FINDINGS: Parenchymal Echotexture: Mildly heterogenous Isthmus: 2 mm Right lobe: 4.5 x 1.3 x 1.7 cm Left lobe: 3.7 x 1.1 x 1.5 cm _________________________________________________________ Estimated total number of nodules >/= 1 cm: 0 Number of spongiform nodules >/=  2 cm not described below (TR1): 0 Number of mixed cystic and solid nodules >/= 1.5 cm not described below (TR2): 0 _________________________________________________________ No significant thyroid abnormality appreciated. No hypervascularity. Inferior and appearing separate from the left inferior thyroid lobe, there is a solid mildly heterogeneous hypoechoic nodule measuring 2.7 x 1.3 x 1.5 cm. This correlates with the neck CTA finding. In this location, an enlarged parathyroid gland or parathyroid adenoma would  be favored. Another consideration would be enlarged lymph node. IMPRESSION: No significant thyroid abnormality. 2.7 cm hypoechoic nodule separate and inferior to the left inferior thyroid lobe, consider enlarged parathyroid gland or parathyroid adenoma. Correlate with parathyroid hormone level and calcium level. If there is concern for hyperparathyroidism, consider further evaluation with a nuclear medicine parathyroid scan. The above is in keeping with the ACR TI-RADS recommendations - J Am Coll Radiol 2017;14:587-595. Electronically Signed   By: Jerilynn Mages.  Shick M.D.   On: 04/21/2021 13:12    ASSESSMENT/PLAN:  Assessment: Principal Problem:   Demyelinating disease of central nervous system (HCC) Active Problems:   Thyroid nodule greater than or equal to 1 cm in diameter incidentally noted on imaging study  [name] is a 46 y.o. with a pertinent PMH of generalized anxiety disorder, tobacco use disorder and bipolar I disorder , who presented with bilateral lower extremity weakness and gait instability and admitted for demyelinating disease on hospital day 2.   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 NMO. Responding well to IV solumedrol, weakness is improving.  - Neurology consulted, appreciate recommendations -Plan for outpatient neurology follow-up with Dr. Renne Musca at [facility] - IV solumedrol daily through 01/08 for 5 days total, daily protonix  - F/u serum NMO panel and Mog Ab -Vitamin B12 borderline low, MMA pending, MCV and Hgb wnl -PT/OT following, appreciate their recommendations -WBC elevated in setting of high-dose steroids, will check glucose with high-dose steroids -CBC  #GAD  - Resume home buspar 15mg  tid and xanax 1mg  tid prn   #Bipolar I disorder - Resume home lamictal 200mg  qHS and risperidone 5mg  qHS    #Incidental nodule, possible parathyroid hypertrophy vs. Adenoma  - thyroid ultrasound showed 2.7 cm hypoechoic nodule separate and  inferior to left thyroid lobe. Albumin at 4.2 with calcium at 10.1. -follow-up on PTH  #Tobacco use disorder -Patient currently vapes tobacco, up to 3 g daily. -She is not interested in cessation at this time. Has stopped during pregnancy.   Best Practice: Diet: Regular diet IVF: Fluids: None, Rate: None VTE: rivaroxaban (XARELTO) tablet 10 mg Start: 04/20/21 1000 Code: Full AB: None Therapy Recs: Pending DME: rolling walker Family Contact: Patient states she will update husband DISPO: Anticipated discharge in 2 days to Home pending improvement in neurological symptoms and completion of IV steroids on 01/08.  Signature: Daleen Bo. Aryssa Rosamond, D.O.  Internal Medicine Resident, PGY-1 [facility]  Pager: [phone] 10:02 AM, 04/22/2021   Please contact the on call pager after 5 pm and on weekends at [phone].

## 2021-04-23 DIAGNOSIS — G379 Demyelinating disease of central nervous system, unspecified: Secondary | ICD-10-CM | POA: Diagnosis not present

## 2021-04-23 LAB — COMPREHENSIVE METABOLIC PANEL
ALT: 11 U/L (ref 0–44)
AST: 19 U/L (ref 15–41)
Albumin: 3.4 g/dL — ABNORMAL LOW (ref 3.5–5.0)
Alkaline Phosphatase: 62 U/L (ref 38–126)
Anion gap: 7 (ref 5–15)
BUN: 21 mg/dL — ABNORMAL HIGH (ref 6–20)
CO2: 24 mmol/L (ref 22–32)
Calcium: 9.9 mg/dL (ref 8.9–10.3)
Chloride: 105 mmol/L (ref 98–111)
Creatinine, Ser: 0.9 mg/dL (ref 0.44–1.00)
GFR, Estimated: >60 mL/min (ref >60)
Glucose, Bld: 195 mg/dL — ABNORMAL HIGH (ref 70–99)
Potassium: 5.6 mmol/L — ABNORMAL HIGH (ref 3.5–5.1)
Sodium: 136 mmol/L (ref 135–145)
Total Bilirubin: 0.6 mg/dL (ref 0.3–1.2)
Total Protein: 6.1 g/dL — ABNORMAL LOW (ref 6.5–8.1)

## 2021-04-23 LAB — CBC WITH DIFFERENTIAL/PLATELET
Abs Immature Granulocytes: 0.11 10*3/uL — ABNORMAL HIGH (ref 0.00–0.07)
Basophils Absolute: 0 10*3/uL (ref 0.0–0.1)
Basophils Relative: 0 %
Eosinophils Absolute: 0 10*3/uL (ref 0.0–0.5)
Eosinophils Relative: 0 %
HCT: 37.4 % (ref 36.0–46.0)
Hemoglobin: 12.8 g/dL (ref 12.0–15.0)
Immature Granulocytes: 1 %
Lymphocytes Relative: 4 %
Lymphs Abs: 0.5 10*3/uL — ABNORMAL LOW (ref 0.7–4.0)
MCH: 32.1 pg (ref 26.0–34.0)
MCHC: 34.2 g/dL (ref 30.0–36.0)
MCV: 93.7 fL (ref 80.0–100.0)
Monocytes Absolute: 0.6 10*3/uL (ref 0.1–1.0)
Monocytes Relative: 4 %
Neutro Abs: 12.8 10*3/uL — ABNORMAL HIGH (ref 1.7–7.7)
Neutrophils Relative %: 91 %
Platelets: 215 10*3/uL (ref 150–400)
RBC: 3.99 MIL/uL (ref 3.87–5.11)
RDW: 12.4 % (ref 11.5–15.5)
WBC: 14.1 10*3/uL — ABNORMAL HIGH (ref 4.0–10.5)
nRBC: 0 % (ref 0.0–0.2)

## 2021-04-23 LAB — VITAMIN D 25 HYDROXY (VIT D DEFICIENCY, FRACTURES): Vit D, 25-Hydroxy: 10.46 ng/mL — ABNORMAL LOW (ref 30–100)

## 2021-04-23 LAB — HEPATITIS PANEL, ACUTE
HCV Ab: REACTIVE — AB
Hep A IgM: NONREACTIVE
Hep B C IgM: NONREACTIVE
Hepatitis B Surface Ag: NONREACTIVE

## 2021-04-23 LAB — GLUCOSE, RANDOM: Glucose, Bld: 195 mg/dL — ABNORMAL HIGH (ref 70–99)

## 2021-04-23 LAB — PTH, INTACT AND CALCIUM
Calcium, Total (PTH): 10.4 mg/dL — ABNORMAL HIGH (ref 8.7–10.2)
PTH: 50 pg/mL (ref 15–65)

## 2021-04-23 MED ORDER — VITAMIN D (ERGOCALCIFEROL) 1.25 MG (50000 UNIT) PO CAPS
50000.0000 [IU] | ORAL_CAPSULE | ORAL | Status: DC
  Administered 2021-04-23: 50000 [IU] via ORAL
  Filled 2021-04-23: qty 1

## 2021-04-23 NOTE — Progress Notes (Signed)
Neurology Progress Note  Brief HPI: 46 y.o. female with PMHx of anxiety and bipolar 1 disorder who presented to the ED 1/3 for evaluation of difficulty walking for one week. Patient states that [place] around the mall with her [place] on December 28 she suddenly had trouble with walking with progressive worsening to where she is holding onto walls and requiring a cane for ambulation. She also complained of left upper and lower extremity numbness and neck pain with onset around the same time. MRI brain, cervical spine, and thoracic spine were obtained revealing enhancing lesions concerning for active demyelination and patient was started on Solu-Medrol for management. On further interview, patient states that her friends have noted that she has been walking abnormally for quite some time now but the patient states that she was unaware.  Subjective: No acute events overnight. Patient is ambulating well. Weakness has nearly resolved. No new complaints today.  Exam: Vitals:   04/23/21 1247 04/23/21 1504  BP: 132/70 118/72  Pulse: 60 (!) 59  Resp: 20 16  Temp: 97.6 F (36.4 C) (!) 97.5 F (36.4 C)  SpO2: 96% 97%   Gen: Sitting up in bed, in no acute distress Resp: non-labored breathing, no respiratory distress Abd: soft, non-tender, non-distended  Neuro: Mental Status: Awake, alert, and oriented x 4.  Speech is intact and fluent without dysarthria, no aphasia or neglect is noted.  Good attention noted.  Cranial Nerves: PERRL, no visual field deficits, EOMI without gaze preference, ptosis, or nystagmus, facial sensation to light touch is intact and symmetric, face is symmetric resting and smiling, hearing is intact to voice, tongue is midline Motor: Bilateral upper extremities with 5/5 strength With hip flexion patient has 4+/5 strength with 5/5 elsewhere BLE throughout. Sensory:Intact and symmetric to light touch throughout. Patient states that on her left upper and lower extremities  that sensation to light touch is intact but she does have a "heavy" sensation that is persistent.  DTR: 1+ patellae, 2+ biceps Gait: Deferred  Pertinent Labs: CBC    Component Value Date/Time   WBC 14.1 (H) 04/23/2021 0152   RBC 3.99 04/23/2021 0152   HGB 12.8 04/23/2021 0152   HCT 37.4 04/23/2021 0152   PLT 215 04/23/2021 0152   MCV 93.7 04/23/2021 0152   MCH 32.1 04/23/2021 0152   MCHC 34.2 04/23/2021 0152   RDW 12.4 04/23/2021 0152   LYMPHSABS 0.5 (L) 04/23/2021 0152   MONOABS 0.6 04/23/2021 0152   EOSABS 0.0 04/23/2021 0152   BASOSABS 0.0 04/23/2021 0152   CMP     Component Value Date/Time   NA 136 04/23/2021 0152   K 5.6 (H) 04/23/2021 0152   CL 105 04/23/2021 0152   CO2 24 04/23/2021 0152   GLUCOSE 195 (H) 04/23/2021 0152   GLUCOSE 195 (H) 04/23/2021 0152   BUN 21 (H) 04/23/2021 0152   CREATININE 0.90 04/23/2021 0152   CALCIUM 9.9 04/23/2021 0152   CALCIUM 10.4 (H) 04/22/2021 0119   PROT 6.1 (L) 04/23/2021 0152   ALBUMIN 3.4 (L) 04/23/2021 0152   AST 19 04/23/2021 0152   ALT 11 04/23/2021 0152   ALKPHOS 62 04/23/2021 0152   BILITOT 0.6 04/23/2021 0152   GFRNONAA >60 04/23/2021 0152   GFRAA >90 12/27/2013 1827   Imaging Reviewed:  MRI brain w contrast 1/4:  Lesions within the right centrum semiovale, left corona radiata and within the genu of the corpus callosum demonstrate corresponding enhancement. Findings likely reflect sites of active demyelination.  MR thoracic spine  wwo contrast 1/4: - Multiple short-segment T2 STIR hyperintense lesions within the thoracic spinal cord, as described and highly suspicious for a demyelinating process (such as multiple sclerosis). Spinal cord lesions demonstrate corresponding enhancement at the T1, T7, T8, T9, T10 and T11 levels and these likely reflect sites of active demyelination. - Thoracic spondylosis, as described. Most notably, disc protrusions contact and mildly flatten the ventral spinal cord at T6-T7, T7-T8 and  T10-T11. However, the dorsal CSF space is maintained within the spinal canal at these levels. - Small bilateral pleural effusions.  MRI cervical spine wwo contrast 1/4: - Multiple short-segment T2 STIR hyperintense lesions within the cervicomedullary junction and cervical spinal cord, as described. Findings are highly suspicious for a demyelinating process (such as multiple sclerosis). Spinal cord lesions demonstrate corresponding enhancement at the C2-C3, C5 and C6 levels and these likely reflect sites of active demyelination. - Cervical spondylosis, as described. No more than mild spinal canal stenosis or neural foraminal narrowing. - Straightening of the expected cervical lordosis. - Redemonstrated 1.9 cm left thyroid lobe nodule. A nonemergent thyroid ultrasound is recommended for further evaluation.  Assessment: 46 y.o. female who presented to the ED 1/3 with difficulty walking for the last week. Her neurologic examination is notable for BL lower extremity weakness along with left sided numbness that has since improved. Given her MRI Brain w/o contrast findings in addition to her MRI cervical and thoracic spine findings, there is high concern for a demyelinating disease. Given the absence of longitudinally extensive lesions in the spinal cord and neg serum NMO, favor MS. Vit D low at 10, wil supp. Based on personal review of her MR imaging, she has highly active disease and will benefit from high-efficacy DMARD (to be arranged in clinic after discharge).   Recommendations: - Follow up serum Mog Ab - Continue Solu-Medrol + PPI last dose 5/8 - Vit D 50,000 U weekly - DMARD labs pending: CMP, CBC with diff, JC virus, hepatitis panel, quantiferon gold - Will need outpatient neurology follow up with Dr. Felecia Shelling at [facility] - Patient can be discharged tmrw from neuro standpoint after she is seen by neuro attending and gets her last dose solumedrol  Su Monks, MD [facility] [phone]  If 7pm- 7am, please page neurology on call as listed in [place].

## 2021-04-23 NOTE — Progress Notes (Signed)
HD#3 Subjective:  Overnight Events: No events  Patient is seen at bedside with her family.  She appears comfortable and in no acute distress.  She reports that her left-sided weakness has improved.  She has been walking without a walker.  Denies any balancing issue.  She has no acute complaints today.  All questions was answered.  Objective:  Vital signs in last 24 hours: Vitals:   04/22/21 1455 04/22/21 1943 04/23/21 0100 04/23/21 0449  BP: 115/73 112/67 (!) 112/59 107/61  Pulse: (!) 105 (!) 56 60 64  Resp: 18 18 17 17   Temp: 97.7 F (36.5 C) 98.1 F (36.7 C) 97.9 F (36.6 C) 97.8 F (36.6 C)  TempSrc: Oral Oral Oral Oral  SpO2: 98% 97% 97% 94%  Weight:    98.3 kg   Supplemental O2: Room Air SpO2: 94 %   Physical Exam:  Physical Exam Constitutional:      General: She is not in acute distress.    Appearance: She is not ill-appearing.  HENT:     Head: Normocephalic.  Eyes:     General:        Right eye: No discharge.        Left eye: No discharge.     Conjunctiva/sclera: Conjunctivae normal.  Cardiovascular:     Rate and Rhythm: Normal rate and regular rhythm.  Pulmonary:     Effort: Pulmonary effort is normal. No respiratory distress.  Musculoskeletal:        General: Normal range of motion.  Skin:    General: Skin is warm.  Neurological:     Mental Status: She is alert and oriented to person, place, and time.     Comments: Normal strength of bilateral lower and upper extremity.  Mild rigidity with motion of left upper extremity  Psychiatric:        Mood and Affect: Mood normal.    Filed Weights   04/21/21 0500 04/22/21 0500 04/23/21 0449  Weight: 97 kg 96.8 kg 98.3 kg     Intake/Output Summary (Last 24 hours) at 04/23/2021 0647 Last data filed at 04/22/2021 1800 Gross per 24 hour  Intake 840 ml  Output --  Net 840 ml   Net IO Since Admission: 1,553 mL [04/23/21 0647]  Pertinent Labs: CBC Latest Ref Rng & Units 04/23/2021 04/22/2021 04/21/2021  WBC  4.0 - 10.5 K/uL 14.1(H) 15.4(H) 12.8(H)  Hemoglobin 12.0 - 15.0 g/dL 12.8 13.4 14.1  Hematocrit 36.0 - 46.0 % 37.4 39.4 41.0  Platelets 150 - 400 K/uL 215 240 267    CMP Latest Ref Rng & Units 04/23/2021 04/23/2021 04/22/2021  Glucose 70 - 99 mg/dL 195(H) 195(H) 174(H)  BUN 6 - 20 mg/dL 21(H) - 17  Creatinine 0.44 - 1.00 mg/dL 0.90 - 0.87  Sodium 135 - 145 mmol/L 136 - 136  Potassium 3.5 - 5.1 mmol/L 5.6(H) - 4.3  Chloride 98 - 111 mmol/L 105 - 106  CO2 22 - 32 mmol/L 24 - 25  Calcium 8.9 - 10.3 mg/dL 9.9 - 10.1  Total Protein 6.5 - 8.1 g/dL 6.1(L) - -  Total Bilirubin 0.3 - 1.2 mg/dL 0.6 - -  Alkaline Phos 38 - 126 U/L 62 - -  AST 15 - 41 U/L 19 - -  ALT 0 - 44 U/L 11 - -    Imaging: No results found.  Assessment/Plan:   Principal Problem:   Demyelinating disease of central nervous system [facility]) Active Problems:   Thyroid nodule greater than  or equal to 1 cm in diameter incidentally noted on imaging study   Abnormal ultrasound of parathyroid gland   Patient Summary: [name] is a 46 y.o. with a pertinent PMH of generalized anxiety disorder, tobacco use disorder and bipolar I disorder , who presented with bilateral lower extremity weakness and gait instability and admitted for demyelinating disease.   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  Favor MS.  She is responding well to IV Solu-Medrol.  - Neurology consulted, appreciate recommendations -Plan for outpatient neurology follow-up with Dr. Renne Musca at [facility].  She will be started on DMARD in the clinic.  Pending baseline DMARD blood work. - IV solumedrol daily through 01/08 for 5 days total (4/5), daily protonix  -Outpatient PT/OT already set up   #Incidental nodule, possible parathyroid hypertrophy vs. Adenoma  Thyroid ultrasound showed 2.7 cm hypoechoic nodule separate and inferior to left thyroid lobe, suspect parathyroid hypertrophy or adenoma.  Calcium is on the higher  side but PTH normal.  No evidence of hyperparathyroidism. -No further work-up  #Vitamin D deficiency Vitamin D level of 10 -Start vit D2 50000 units weekly -Repeat vitamin D level in 6-12 weeks.   #GAD  - Resume home buspar 15mg  tid and xanax 1mg  tid prn   #Bipolar I disorder - Resume home lamictal 200mg  qHS and risperidone 5mg  qHS   #Tobacco use disorder -Patient currently vapes tobacco, up to 3 g daily. -She is not interested in cessation at this time. Has stopped during pregnancy.     Best Practice: Diet: Regular diet IVF: Fluids: None, Rate: None VTE: rivaroxaban (XARELTO) tablet 10 mg Start: 04/20/21 1000 Code: Full AB: None Therapy Recs: Pending DME: rolling walker Family Contact: Patient states she will update husband DISPO: Anticipated discharge in 1 days to Home pending improvement in neurological symptoms and completion of IV steroids on 01/08.    Gaylan Gerold, DO 04/23/2021, 6:47 AM Pager: [phone]  Please contact the on call pager after 5 pm and on weekends at [phone].

## 2021-04-23 NOTE — Progress Notes (Signed)
Physical Therapy Treatment Patient Details Name: [name] MRN: [id number] DOB: 07-23-1975 Today's Date: 04/23/2021   History of Present Illness Pt is a 46 y/o female admitted secondary to BLE weakness (L extremities>R extremities). MRI showed spinal cord lesions demonstrate corresponding  enhancement at the C2-C3, C5 and C6 levels, T1, T7, T8, T9,  T10 and T11 levels, and lesions within the right centrum semiovale, left corona radiata and  within the genu of the corpus callosum demonstrate corresponding  enhancement. PMH includes bipolar disorder    PT Comments    Pt demonstrates mod I bed mobility. Supervision transfers, and min guard assist ambulation 75' without AD. Recommend AD (cane vs RW) for increased distances. Pt declined going to stairwell due to arrival of visitors. Educated in room on stair methods/techniques. Pt reports she is feeling much better. Mild instability noted with gait but no overt LOB.    Recommendations for follow up therapy are one component of a multi-disciplinary discharge planning process, led by the attending physician.  Recommendations may be updated based on patient status, additional functional criteria and insurance authorization.  Follow Up Recommendations  Outpatient PT (neuro outpatient)     Assistance Recommended at Discharge Intermittent Supervision/Assistance  Patient can return home with the following A little help with walking and/or transfers;A little help with bathing/dressing/bathroom;Assistance with cooking/housework;Help with stairs or ramp for entrance   Equipment Recommendations  Rolling walker (2 wheels)    Recommendations for Other Services       Precautions / Restrictions Precautions Precautions: Fall     Mobility  Bed Mobility Overal bed mobility: Modified Independent                  Transfers Overall transfer level: Needs assistance Equipment used: None Transfers: Sit to/from Stand Sit to Stand: Supervision            General transfer comment: supervision for safety    Ambulation/Gait Ambulation/Gait assistance: Min guard Gait Distance (Feet): 75 Feet Assistive device: None Gait Pattern/deviations: Step-through pattern;Decreased stride length;Wide base of support Gait velocity: Decreased     General Gait Details: mildly unsteady but no overt LOB. Educated on use of cane for RW for increased stability with increased distances.   Stairs Stairs: Yes       General stair comments: Educated,  verbally and through simulated demonstration, in room on stairs with one rail,. Forward and sideways, alternating and step-to pattern. Pt declined going to stairwell due to arrival of visitors.   Wheelchair Mobility    Modified Rankin (Stroke Patients Only)       Balance Overall balance assessment: Needs assistance Sitting-balance support: No upper extremity supported;Feet supported Sitting balance-Leahy Scale: Good     Standing balance support: No upper extremity supported;During functional activity Standing balance-Leahy Scale: Fair                              Cognition Arousal/Alertness: Awake/alert Behavior During Therapy: WFL for tasks assessed/performed Overall Cognitive Status: Within Functional Limits for tasks assessed                                          Exercises      General Comments        Pertinent Vitals/Pain Pain Assessment: Faces Faces Pain Scale: Hurts a little bit Pain Location: LLE Pain Descriptors / Indicators:  Shooting;Discomfort Pain Intervention(s): Monitored during session    Home Living                          Prior Function            PT Goals (current goals can now be found in the care plan section) Acute Rehab PT Goals Patient Stated Goal: home Progress towards PT goals: Progressing toward goals    Frequency    Min 3X/week      PT Plan Current plan remains appropriate     Co-evaluation              AM-PAC PT "6 Clicks" Mobility   Outcome Measure  Help needed turning from your back to your side while in a flat bed without using bedrails?: None Help needed moving from lying on your back to sitting on the side of a flat bed without using bedrails?: None Help needed moving to and from a bed to a chair (including a wheelchair)?: A Little Help needed standing up from a chair using your arms (e.g., wheelchair or bedside chair)?: A Little Help needed to walk in hospital room?: A Little Help needed climbing 3-5 steps with a railing? : A Little 6 Click Score: 20    End of Session   Activity Tolerance: Patient tolerated treatment well Patient left: in bed;with call bell/phone within reach;with family/visitor present Nurse Communication: Mobility status PT Visit Diagnosis: Unsteadiness on feet (R26.81);Muscle weakness (generalized) (M62.81);Difficulty in walking, not elsewhere classified (R26.2)     Time: 2620-3559 PT Time Calculation (min) (ACUTE ONLY): 13 min  Charges:  $Gait Training: 8-22 mins                     Lorrin Goodell, PT  Office # [phone] Pager [phone]    Lorriane Shire 04/23/2021, 1:44 PM

## 2021-04-24 LAB — BASIC METABOLIC PANEL
Anion gap: 5 (ref 5–15)
BUN: 18 mg/dL (ref 6–20)
CO2: 26 mmol/L (ref 22–32)
Calcium: 10.3 mg/dL (ref 8.9–10.3)
Chloride: 106 mmol/L (ref 98–111)
Creatinine, Ser: 0.78 mg/dL (ref 0.44–1.00)
GFR, Estimated: >60 mL/min (ref >60)
Glucose, Bld: 158 mg/dL — ABNORMAL HIGH (ref 70–99)
Potassium: 4.6 mmol/L (ref 3.5–5.1)
Sodium: 137 mmol/L (ref 135–145)

## 2021-04-24 LAB — CBC
HCT: 39.6 % (ref 36.0–46.0)
Hemoglobin: 13.5 g/dL (ref 12.0–15.0)
MCH: 31.8 pg (ref 26.0–34.0)
MCHC: 34.1 g/dL (ref 30.0–36.0)
MCV: 93.2 fL (ref 80.0–100.0)
Platelets: 204 10*3/uL (ref 150–400)
RBC: 4.25 MIL/uL (ref 3.87–5.11)
RDW: 12.2 % (ref 11.5–15.5)
WBC: 10.1 10*3/uL (ref 4.0–10.5)
nRBC: 0 % (ref 0.0–0.2)

## 2021-04-24 LAB — METHYLMALONIC ACID, SERUM: Methylmalonic Acid, Quantitative: 142 nmol/L (ref 0–378)

## 2021-04-24 MED ORDER — VITAMIN D (ERGOCALCIFEROL) 1.25 MG (50000 UNIT) PO CAPS: 50000.0000 [IU] | ORAL_CAPSULE | ORAL | 0 refills | Status: AC

## 2021-04-24 MED ORDER — VITAMIN D (ERGOCALCIFEROL) 1.25 MG (50000 UNIT) PO CAPS
50000.0000 [IU] | ORAL_CAPSULE | ORAL | 0 refills | Status: DC
  Filled 2021-04-24: qty 12, 84d supply, fill #0

## 2021-04-24 NOTE — Progress Notes (Signed)
Physical Therapy Treatment Patient Details Name: [name] MRN: [id number] DOB: [birth year] Today's Date: 04/24/2021   History of Present Illness Pt is a 46 y/o female admitted secondary to BLE weakness (L extremities>R extremities). MRI showed spinal cord lesions demonstrate corresponding  enhancement at the C2-C3, C5 and C6 levels, T1, T7, T8, T9,  T10 and T11 levels, and lesions within the right centrum semiovale, left corona radiata and  within the genu of the corpus callosum demonstrate corresponding  enhancement. PMH includes bipolar disorder    PT Comments    PT session focused on stair training as pt has a flight of stairs at home to access her bedroom. Pt demonstrates mod I bed mobility and transfers. Min guard assist needed for ambulation 250' without AD and ascend/descend 10 steps with L rail. Pt educated on need for cane vs RW for increased stability with gait community distances. Pt assisted to/from bathroom prior to return to bed. Current POC remains appropriate.    Recommendations for follow up therapy are one component of a multi-disciplinary discharge planning process, led by the attending physician.  Recommendations may be updated based on patient status, additional functional criteria and insurance authorization.  Follow Up Recommendations  Outpatient PT (neuro outpatient)     Assistance Recommended at Discharge Intermittent Supervision/Assistance  Patient can return home with the following A little help with bathing/dressing/bathroom;Assistance with cooking/housework;Help with stairs or ramp for entrance;A little help with walking and/or transfers   Equipment Recommendations  Rolling walker (2 wheels)    Recommendations for Other Services       Precautions / Restrictions Precautions Precautions: Fall     Mobility  Bed Mobility Overal bed mobility: Modified Independent             General bed mobility comments: increased time    Transfers Overall  transfer level: Modified independent Equipment used: None               General transfer comment: increased time    Ambulation/Gait Ambulation/Gait assistance: Min guard Gait Distance (Feet): 250 Feet Assistive device: None Gait Pattern/deviations: Step-through pattern;Decreased stride length;Decreased weight shift to left Gait velocity: decreased Gait velocity interpretation: <1.31 ft/sec, indicative of household ambulator   General Gait Details: mildly unsteady but no overt LOB. Educated on use of cane for RW for increased stability with increased distances.   Stairs Stairs: Yes Stairs assistance: Min guard Stair Management: One rail Left;Step to pattern;Forwards Number of Stairs: 10 General stair comments: Pt educated on limiting stairs upon initial return home (i.e. come down from bedroom in morning and go up at night when ready for bed).   Wheelchair Mobility    Modified Rankin (Stroke Patients Only)       Balance Overall balance assessment: Needs assistance Sitting-balance support: No upper extremity supported;Feet supported Sitting balance-Leahy Scale: Good     Standing balance support: No upper extremity supported;During functional activity Standing balance-Leahy Scale: Fair                              Cognition Arousal/Alertness: Awake/alert Behavior During Therapy: WFL for tasks assessed/performed Overall Cognitive Status: Within Functional Limits for tasks assessed                                          Exercises      General Comments General  comments (skin integrity, edema, etc.): VSS on RA      Pertinent Vitals/Pain Pain Assessment: Faces Faces Pain Scale: Hurts a little bit Pain Location: LLE Pain Descriptors / Indicators: Sore;Aching Pain Intervention(s): Monitored during session    Home Living                          Prior Function            PT Goals (current goals can now be found  in the care plan section) Acute Rehab PT Goals Patient Stated Goal: home Progress towards PT goals: Progressing toward goals    Frequency    Min 3X/week      PT Plan Current plan remains appropriate    Co-evaluation              AM-PAC PT "6 Clicks" Mobility   Outcome Measure  Help needed turning from your back to your side while in a flat bed without using bedrails?: None Help needed moving from lying on your back to sitting on the side of a flat bed without using bedrails?: None Help needed moving to and from a bed to a chair (including a wheelchair)?: None Help needed standing up from a chair using your arms (e.g., wheelchair or bedside chair)?: None Help needed to walk in hospital room?: A Little Help needed climbing 3-5 steps with a railing? : A Little 6 Click Score: 22    End of Session Equipment Utilized During Treatment: Gait belt Activity Tolerance: Patient tolerated treatment well Patient left: in bed;with call bell/phone within reach Nurse Communication: Mobility status PT Visit Diagnosis: Unsteadiness on feet (R26.81);Muscle weakness (generalized) (M62.81);Difficulty in walking, not elsewhere classified (R26.2)     Time: 0742-0800 PT Time Calculation (min) (ACUTE ONLY): 18 min  Charges:  $Gait Training: 8-22 mins                     Lorrin Goodell, PT  Office # [phone] Pager [phone]    Lorriane Shire 04/24/2021, 8:21 AM

## 2021-04-25 ENCOUNTER — Other Ambulatory Visit (HOSPITAL_COMMUNITY): Payer: Self-pay

## 2021-04-25 LAB — HCV RNA QUANT: HCV Quantitative: NOT DETECTED IU/mL (ref >50)

## 2021-04-27 LAB — QUANTIFERON-TB GOLD PLUS (RQFGPL)
QuantiFERON Mitogen Value: 0 IU/mL
QuantiFERON Nil Value: 0 IU/mL
QuantiFERON TB1 Ag Value: 0 IU/mL
QuantiFERON TB2 Ag Value: 0.25 IU/mL

## 2021-04-27 LAB — QUANTIFERON-TB GOLD PLUS: QuantiFERON-TB Gold Plus: UNDETERMINED — AB

## 2021-04-27 LAB — JC VIRUS DNA,PCR (WHOLE BLOOD): JC Virus DNA, PCR, Blood: NEGATIVE

## 2021-04-28 DIAGNOSIS — G379 Demyelinating disease of central nervous system, unspecified: Secondary | ICD-10-CM | POA: Diagnosis not present

## 2021-04-29 DIAGNOSIS — G35 Multiple sclerosis: Secondary | ICD-10-CM | POA: Diagnosis not present

## 2021-05-02 ENCOUNTER — Ambulatory Visit: Payer: BC Managed Care – PPO | Admitting: Physician Assistant

## 2021-05-03 LAB — METHYLMALONIC ACID(MMA), RND URINE
Creatinine(Crt), U: 0.99 g/L (ref 0.30–3.00)
MMA - Normalized: 1.8 umol/mmol cr (ref 0.5–3.4)
Methylmalonic Acid, Ur: 15.9 umol/L (ref 1.6–29.7)

## 2021-05-04 DIAGNOSIS — G379 Demyelinating disease of central nervous system, unspecified: Secondary | ICD-10-CM | POA: Diagnosis not present

## 2021-05-13 DIAGNOSIS — G379 Demyelinating disease of central nervous system, unspecified: Secondary | ICD-10-CM | POA: Diagnosis not present

## 2021-05-18 DIAGNOSIS — G379 Demyelinating disease of central nervous system, unspecified: Secondary | ICD-10-CM | POA: Diagnosis not present

## 2021-05-19 ENCOUNTER — Encounter (HOSPITAL_COMMUNITY): Payer: Self-pay | Admitting: Radiology

## 2021-05-22 NOTE — Progress Notes (Signed)
[facility]  PATIENT: [name] DOB: 1975-08-07  REFERRING DOCTOR OR PCP: Rufina Falco MD; [name], MD (PCP) SOURCE: Patient, notes from recent hospital stay, imaging and lab reports, MRI images personally reviewed.  _________________________________   HISTORICAL  CHIEF COMPLAINT:  Chief Complaint  Patient presents with   New Patient (Initial Visit)    Rm 2, w mother Joycelyn Schmid. 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 Pt was seen at the hospital on 04/19/21 for loss of balance and numbness on L side. Pt is able to walk today but balance continues to be off.     HISTORY OF PRESENT ILLNESS:  I had the pleasure of seeing the patient, [name], at [facility] for neurologic consultation about her recent weakness and gait disturbance and abnormal MRIs consistent with MS.  She is a 46 year old woman who had left sided numbness, poor balance and poor gait starting around Christmas 2022.   This was worsening over the next couple days.   She presented to urgent care 04/19/2021 and aa stroke was suspected.   She was then sent to the ED.   In the ED, she had MRI's.  She was admitted to [facility] and had 5 days of IV Solumedrol.   PT was initiated.  She started ti improve byt the end of her hospital stay and has continued to improve.   She started outpatient PT but due to losing her job, she stopped but is still doing the exercises.   In 2015, she had diplopia and blurry right vision.   An MRI at the time was read as normal (my read has one small T2/Flair focus).     Currently, she reports difficulty with balance and left leg weakness.  She denies spasticity.  She has left leg numbness.   The left hand is also numb.    She has had urinary urgency/frequency over the last few months.    She denies any new visual changes and the visual loss from 2015 improved to baseline.  She has fatigue since December.     She is sleeping well  at night.  She has bipolar disease and currently some depression.   She is on lamotrigine, buspirone, risperidone and alprazolam with benefit.   Cognition is fine.    Imaging: MRI of the brain 04/19/2021 and 04/20/2021 showed multiple T2/FLAIR foci in the periventricular and deep white matter of the hemispheres consistent with demyelinating plaque.  After the infusion of contrast (the next day) 2 periventricular foci and 1 deep white matter focus enhanced consistent with acute demyelination.  MRI of the thoracic spine with and without contrast 04/20/2021 showed multiple T2 hyperintense foci within the spinal cord.  These are located at C7, towards the left at T1, centrally towards the right at T5-T6, posterolaterally to the right at T6, centrally to the left adjacent to T7, to the right adjacent to T8-T9 anterolaterally to the right adjacent to T11.  The foci adjacent to C2, C5, C6, T1, T8 and T9 and T11 enhanced after contrast.  Additionally, there are disc protrusions at C6-C7, to the left at T7-T8, broadly at T10-T11 that causes mild spinal stenosis.  Disc bulge is noted to the left at T6-T7  MRI of the brain 12/27/2013 (at the time of right visual loss) was read as normal.  There appears to be 1 small nonenhancing focus in the left posterior frontal lobe.   Laboratory: January 2023: Vitamin D was low at  10.  Hemoglobin A1c was normal.  HIV negative.  RPR negative.  QuantiFERON-TB was indeterminant, hepatitis panel was negative, hep C RNA was negative.Marland Kitchen  REVIEW OF SYSTEMS: Constitutional: No fevers, chills, sweats, or change in appetite Eyes: No visual changes, double vision, eye pain Ear, nose and throat: No hearing loss, ear pain, nasal congestion, sore throat Cardiovascular: No chest pain, palpitations Respiratory:  No shortness of breath at rest or with exertion.   No wheezes GastrointestinaI: No nausea, vomiting, diarrhea, abdominal pain, fecal incontinence Genitourinary:  No dysuria, urinary  retention or frequency.  No nocturia. Musculoskeletal:  No neck pain, back pain Integumentary: No rash, pruritus, skin lesions Neurological: as above Psychiatric: No depression at this time.  No anxiety Endocrine: No palpitations, diaphoresis, change in appetite, change in weigh or increased thirst Hematologic/Lymphatic:  No anemia, purpura, petechiae. Allergic/Immunologic: No itchy/runny eyes, nasal congestion, recent allergic reactions, rashes  ALLERGIES: No Known Allergies  HOME MEDICATIONS:  Current Outpatient Medications:    ALPRAZolam (XANAX) 1 MG tablet, One po tid prn. (Patient taking differently: Take 1 mg by mouth 3 (three) times daily. One po tid prn.), Disp: 90 tablet, Rfl: 2   busPIRone (BUSPAR) 15 MG tablet, TAKE 2 TABLETS BY MOUTH EVERY MORNING AND 1 TABLET BY MOUTH EVERY EVENING (Patient taking differently: Take 15 mg by mouth 3 (three) times daily. TAKE 2 TABLETS BY MOUTH EVERY MORNING AND 1 TABLET BY MOUTH EVERY EVENING), Disp: 270 tablet, Rfl: 0   lamoTRIgine (LAMICTAL) 200 MG tablet, TAKE 1 TABLET AT BEDTIME (Patient taking differently: Take 200 mg by mouth daily.), Disp: 90 tablet, Rfl: 0   oxybutynin (DITROPAN) 5 MG tablet, Take 1 tablet (5 mg total) by mouth 3 (three) times daily., Disp: 60 tablet, Rfl: 11   risperidone (RISPERDAL) 4 MG tablet, TAKE 1 TABLET AT BEDTIME (Patient taking differently: Take 4 mg by mouth daily.), Disp: 90 tablet, Rfl: 0   Vitamin D, Ergocalciferol, (DRISDOL) 1.25 MG (50000 UNIT) CAPS capsule, Take 1 capsule (50,000 Units total) by mouth every 7 (seven) days., Disp: 12 capsule, Rfl: 0   ibuprofen (ADVIL) 200 MG tablet, Take 200 mg by mouth every 6 (six) hours as needed for headache or mild pain., Disp: , Rfl:   PAST MEDICAL HISTORY: Past Medical History:  Diagnosis Date   Anxiety    Bipolar 1 disorder ([place])    Hepatitis C     PAST SURGICAL HISTORY: Past Surgical History:  Procedure Laterality Date   CESAREAN SECTION  x 2     FAMILY HISTORY: Family History  Problem Relation Age of Onset   High blood pressure Mother     SOCIAL HISTORY:  Social History   Socioeconomic History   Marital status: Married    Spouse name: Elta Guadeloupe   Number of children: 2   Years of education: Not on file   Highest education level: Bachelor's degree (e.g., BA, AB, BS)  Occupational History   Not on file  Tobacco Use   Smoking status: Every Day    Types: E-cigarettes   Smokeless tobacco: Never   Tobacco comments:    vapes q 2 hours or so. Used to smoke 1 ppd  Vaping Use   Vaping Use: Every day  Substance and Sexual Activity   Alcohol use: Never   Drug use: Never   Sexual activity: Yes    Birth control/protection: Surgical  Other Topics Concern   Not on file  Social History Narrative   Live at home with husband and children  Right handed   Caffeine: 1 C of coffee a day, occ a soda a day   Social Determinants of Health   Financial Resource Strain: Not on file  Food Insecurity: Not on file  Transportation Needs: Not on file  Physical Activity: Not on file  Stress: Not on file  Social Connections: Not on file  Intimate Partner Violence: Not on file     PHYSICAL EXAM  Vitals:   05/23/21 0852  BP: 110/76  Pulse: 75  SpO2: 97%  Weight: 221 lb (100.2 kg)  Height: 5' 5.5" (1.664 m)    Body mass index is 36.22 kg/m.   General: The patient is well-developed and well-nourished and in no acute distress  HEENT:  Head is /AT.  Sclera are anicteric.  Funduscopic exam shows mild right optic nerve pallor.   Normal retinal vessels.  Neck: No carotid bruits are noted.  The neck is nontender.  Cardiovascular: The heart has a regular rate and rhythm with a normal S1 and S2. There were no murmurs, gallops or rubs.    Skin: Extremities are without rash or  edema.  Musculoskeletal:  Back is nontender  Neurologic Exam  Mental status: The patient is alert and oriented x 3 at the time of the examination. The  patient has apparent normal recent and remote memory, with an apparently normal attention span and concentration ability.   Speech is normal.  Cranial nerves: Extraocular movements are full. Pupils are equal, round, and reactive to light and accomodation.  Visual fields are full.  Facial symmetry is present. There is good facial sensation to soft touch bilaterally.Facial strength is normal.  Trapezius and sternocleidomastoid strength is normal. No dysarthria is noted.  The tongue is midline, and the patient has symmetric elevation of the soft palate. No obvious hearing deficits are noted.  Motor:  Muscle bulk is normal.   Tone is normal. Strength is  5 / 5 in aamrs and right leg but 4+/5 in left iliopsoas and foot/ankle.   Sensory: Sensory testing ishows reduced touch but symmetric vibraiton on the left.    Coordination: Cerebellar testing reveals good finger-nose-finger and heel-to-shin bilaterally.  Gait and station: Station is normal.   Gait is mildly wide but tandem is very poor... Romberg is negative.   Reflexes: Deep tendon reflexes are symmetric and normal bilaterally.   Plantar responses are flexor.    DIAGNOSTIC DATA (LABS, IMAGING, TESTING) - I reviewed patient records, labs, notes, testing and imaging myself where available.  Lab Results  Component Value Date   WBC 10.1 04/24/2021   HGB 13.5 04/24/2021   HCT 39.6 04/24/2021   MCV 93.2 04/24/2021   PLT 204 04/24/2021      Component Value Date/Time   NA 137 04/24/2021 0202   K 4.6 04/24/2021 0202   CL 106 04/24/2021 0202   CO2 26 04/24/2021 0202   GLUCOSE 158 (H) 04/24/2021 0202   BUN 18 04/24/2021 0202   CREATININE 0.78 04/24/2021 0202   CALCIUM 10.3 04/24/2021 0202   CALCIUM 10.4 (H) 04/22/2021 0119   PROT 6.1 (L) 04/23/2021 0152   ALBUMIN 3.4 (L) 04/23/2021 0152   AST 19 04/23/2021 0152   ALT 11 04/23/2021 0152   ALKPHOS 62 04/23/2021 0152   BILITOT 0.6 04/23/2021 0152   GFRNONAA >60 04/24/2021 0202   GFRAA  >90 12/27/2013 1827   No results found for: CHOL, HDL, LDLCALC, LDLDIRECT, TRIG, CHOLHDL Lab Results  Component Value Date   HGBA1C 4.7 (L) 04/21/2021  Lab Results  Component Value Date   VITAMINB12 215 04/21/2021   Lab Results  Component Value Date   TSH 0.755 04/21/2021       ASSESSMENT AND PLAN  Multiple sclerosis ([place]) - Plan: Hepatitis B core antibody, total, Stratify JCV(TM) Ab w/Index, IgG, IgA, IgM, QuantiFERON-TB Gold Plus, Varicella zoster antibody, IgG, Hepatitis B surface antibody,qualitative  High risk medication use - Plan: Hepatitis B core antibody, total, Stratify JCV(TM) Ab w/Index, IgG, IgA, IgM, QuantiFERON-TB Gold Plus, Varicella zoster antibody, IgG, Hepatitis B surface antibody,qualitative  Bipolar 1 disorder ([facility])  Gait disturbance  Left leg weakness  Numbness  Urinary urgency   In summary, Ms. Rawl is a 46 year old woman who presented with reduced gait, weakness and numbness and was found to have an MRI of the brain and cervical spine consistent with MS.  Of note, she had probable optic neuritis in 2015 but an MRI of the brain at that time showed only 1 small nonspecific focus.  She is presenting very aggressively with about 6 enhancing lesions in the spinal cord and several nonenhancing lesions.  Additionally she has 2 enhancing lesions in the brain.  Due to this aggressive presentation, we need to get her started on a highly effective disease modifying therapy first-line.  We discussed Tysabri and Ocrevus as 2 options.  We will check some blood work and try to get one of the started soon.  Symptomatically, she is improving.  She continues to have bladder dysfunction and I will send in a prescription for oxybutynin.  She was doing physical therapy but stopped when she lost her job.  Because she has improved quite a bit, she is advised to continue to be active and exercise as tolerated.  She will return to see me in 4 months or sooner if there are  new or worsening neurologic symptoms.   Tamiko Leopard A. Felecia Shelling, MD, [facility] 11/19/2776, 24:23 PM Certified in Neurology, Clinical Neurophysiology, Sleep Medicine and Neuroimaging  [facility] [address], [place], [place] [address] [phone]

## 2021-05-23 ENCOUNTER — Encounter: Payer: Self-pay | Admitting: Neurology

## 2021-05-23 ENCOUNTER — Ambulatory Visit: Payer: BC Managed Care – PPO | Admitting: Neurology

## 2021-05-23 VITALS — BP 110/76 | HR 75 | Ht 65.5 in | Wt 221.0 lb

## 2021-05-23 DIAGNOSIS — R200 Anesthesia of skin: Secondary | ICD-10-CM

## 2021-05-23 DIAGNOSIS — R29898 Other symptoms and signs involving the musculoskeletal system: Secondary | ICD-10-CM

## 2021-05-23 DIAGNOSIS — F319 Bipolar disorder, unspecified: Secondary | ICD-10-CM | POA: Diagnosis not present

## 2021-05-23 DIAGNOSIS — G35 Multiple sclerosis: Secondary | ICD-10-CM | POA: Diagnosis not present

## 2021-05-23 DIAGNOSIS — R3915 Urgency of urination: Secondary | ICD-10-CM

## 2021-05-23 DIAGNOSIS — R269 Unspecified abnormalities of gait and mobility: Secondary | ICD-10-CM | POA: Diagnosis not present

## 2021-05-23 DIAGNOSIS — Z8619 Personal history of other infectious and parasitic diseases: Secondary | ICD-10-CM

## 2021-05-23 DIAGNOSIS — Z79899 Other long term (current) drug therapy: Secondary | ICD-10-CM

## 2021-05-23 HISTORY — DX: Other long term (current) drug therapy: Z79.899

## 2021-05-23 HISTORY — DX: Anesthesia of skin: R20.0

## 2021-05-23 MED ORDER — OXYBUTYNIN CHLORIDE 5 MG PO TABS: 5.0000 mg | ORAL_TABLET | Freq: Three times a day (TID) | ORAL | 11 refills | Status: DC

## 2021-06-01 ENCOUNTER — Other Ambulatory Visit: Payer: Self-pay | Admitting: Family Medicine

## 2021-06-01 DIAGNOSIS — Z0000 Encounter for general adult medical examination without abnormal findings: Secondary | ICD-10-CM | POA: Diagnosis not present

## 2021-06-01 DIAGNOSIS — M79661 Pain in right lower leg: Secondary | ICD-10-CM

## 2021-06-01 DIAGNOSIS — M79604 Pain in right leg: Secondary | ICD-10-CM

## 2021-06-01 DIAGNOSIS — E559 Vitamin D deficiency, unspecified: Secondary | ICD-10-CM | POA: Diagnosis not present

## 2021-06-03 ENCOUNTER — Ambulatory Visit
Admission: RE | Admit: 2021-06-03 | Discharge: 2021-06-03 | Disposition: A | Discharge status: Home / Self Care | Payer: BC Managed Care – PPO | Source: Ambulatory Visit | Attending: Family Medicine | Admitting: Family Medicine

## 2021-06-03 DIAGNOSIS — M79661 Pain in right lower leg: Secondary | ICD-10-CM

## 2021-06-03 DIAGNOSIS — M79604 Pain in right leg: Secondary | ICD-10-CM

## 2021-06-03 DIAGNOSIS — I82811 Embolism and thrombosis of superficial veins of right lower extremities: Secondary | ICD-10-CM | POA: Diagnosis not present

## 2021-06-03 LAB — STRATIFY JCV(TM) AB W/INDEX

## 2021-06-09 ENCOUNTER — Telehealth: Payer: Self-pay | Admitting: *Deleted

## 2021-06-09 ENCOUNTER — Encounter: Payer: Self-pay | Admitting: Neurology

## 2021-06-09 ENCOUNTER — Other Ambulatory Visit: Payer: Self-pay | Admitting: *Deleted

## 2021-06-09 ENCOUNTER — Ambulatory Visit: Payer: BC Managed Care – PPO | Admitting: Physician Assistant

## 2021-06-09 ENCOUNTER — Other Ambulatory Visit (INDEPENDENT_AMBULATORY_CARE_PROVIDER_SITE_OTHER): Payer: Self-pay

## 2021-06-09 DIAGNOSIS — Z0289 Encounter for other administrative examinations: Secondary | ICD-10-CM

## 2021-06-09 DIAGNOSIS — G35 Multiple sclerosis: Secondary | ICD-10-CM

## 2021-06-09 LAB — QUANTIFERON-TB GOLD PLUS
QuantiFERON Mitogen Value: >10 IU/mL
QuantiFERON Nil Value: 0.01 IU/mL
QuantiFERON TB1 Ag Value: 0.06 IU/mL
QuantiFERON TB2 Ag Value: 0.04 IU/mL
QuantiFERON-TB Gold Plus: NEGATIVE

## 2021-06-09 LAB — IGG, IGA, IGM
IgA/Immunoglobulin A, Serum: 241 mg/dL (ref 87–352)
IgG (Immunoglobin G), Serum: 1174 mg/dL (ref 586–1602)
IgM (Immunoglobulin M), Srm: 198 mg/dL (ref 26–217)

## 2021-06-09 LAB — HEPATITIS B CORE ANTIBODY, TOTAL: Hep B Core Total Ab: NEGATIVE

## 2021-06-09 LAB — VARICELLA ZOSTER ANTIBODY, IGG: Varicella zoster IgG: <135 index — ABNORMAL LOW (ref >165)

## 2021-06-09 LAB — STRATIFY JCV(TM) AB W/INDEX

## 2021-06-09 LAB — HEPATITIS B SURFACE ANTIBODY,QUALITATIVE: Hep B Surface Ab, Qual: REACTIVE

## 2021-06-09 NOTE — Telephone Encounter (Signed)
Placed [facility] lab in [facility] lock box for routine lab pick up. Results pending. 

## 2021-06-14 ENCOUNTER — Encounter: Payer: Self-pay | Admitting: Neurology

## 2021-06-16 ENCOUNTER — Encounter: Payer: Self-pay | Admitting: Neurology
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
## 2021-06-22 ENCOUNTER — Encounter: Payer: Self-pay | Admitting: Neurology

## 2021-06-24 ENCOUNTER — Other Ambulatory Visit: Payer: Self-pay | Admitting: Physician Assistant

## 2021-06-24 ENCOUNTER — Ambulatory Visit: Payer: BC Managed Care – PPO | Admitting: Physician Assistant

## 2021-06-24 ENCOUNTER — Encounter: Payer: Self-pay | Admitting: Physician Assistant

## 2021-06-24 ENCOUNTER — Telehealth: Payer: Self-pay | Admitting: Physician Assistant

## 2021-06-24 ENCOUNTER — Other Ambulatory Visit: Payer: Self-pay

## 2021-06-24 DIAGNOSIS — F319 Bipolar disorder, unspecified: Secondary | ICD-10-CM

## 2021-06-24 DIAGNOSIS — Z720 Tobacco use: Secondary | ICD-10-CM

## 2021-06-24 DIAGNOSIS — F4323 Adjustment disorder with mixed anxiety and depressed mood: Secondary | ICD-10-CM | POA: Diagnosis not present

## 2021-06-24 DIAGNOSIS — Z560 Unemployment, unspecified: Secondary | ICD-10-CM | POA: Diagnosis not present

## 2021-06-24 DIAGNOSIS — G35 Multiple sclerosis: Secondary | ICD-10-CM | POA: Diagnosis not present

## 2021-06-24 DIAGNOSIS — G35D Multiple sclerosis, unspecified: Secondary | ICD-10-CM

## 2021-06-24 MED ORDER — RISPERIDONE 4 MG PO TABS: 4.0000 mg | ORAL_TABLET | Freq: Every day | ORAL | 0 refills | Status: DC

## 2021-06-24 MED ORDER — ALPRAZOLAM 1 MG PO TABS: 1.0000 mg | ORAL_TABLET | Freq: Three times a day (TID) | ORAL | 2 refills | Status: DC | PRN

## 2021-06-24 MED ORDER — LAMOTRIGINE 150 MG PO TABS: 150.0000 mg | ORAL_TABLET | Freq: Every day | ORAL | 1 refills | Status: DC

## 2021-06-24 MED ORDER — LAMOTRIGINE 150 MG PO TABS: 150.0000 mg | ORAL_TABLET | Freq: Two times a day (BID) | ORAL | 1 refills | Status: DC

## 2021-06-24 MED ORDER — BUSPIRONE HCL 30 MG PO TABS: 30.0000 mg | ORAL_TABLET | Freq: Two times a day (BID) | ORAL | 1 refills | Status: DC

## 2021-06-24 NOTE — Telephone Encounter (Signed)
Pharmacy dispensed #30 even though you sent quantity of #60 they should have called [place] to clarify instructions.Pt stated she will take 1 200 mg tab and 1/2 150 mg tab until she is able to get the correct rx next month.She has enough 200 mg tabs to do this if you agree.

## 2021-06-24 NOTE — Telephone Encounter (Signed)
She already picked it up and paid with insurance ,she would rather use what she has instead of paying for another Rx but she did understand

## 2021-06-24 NOTE — Telephone Encounter (Signed)
I am sorry, that was my fault.  I ordered a quantity of 60 but said take it once a day.  It should be 1 p.o. twice daily with quantity of 60.  I sent a new prescription for Lamictal 150 mg #60, 1 p.o. twice daily.  Please apologize for me.  Thank you
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
## 2021-06-24 NOTE — Telephone Encounter (Signed)
PT got her Lamictal '150mg'$  filled today, but the instructions say 1 per day and the pharmacy only gave her #30. She is not sure what she should do about new Lamictal instructions. She does have the '200mg'$  lamictal still. ? ?

## 2021-06-26 NOTE — Telephone Encounter (Signed)
noted 

## 2021-06-27 NOTE — Telephone Encounter (Signed)
JCV results collected 06/09/2021: 0.20, intedetermination, assay: negative. ? ?Order for Tysbari infusion was given to the infusion suite on 06/20/2021. ?

## 2021-06-30 ENCOUNTER — Other Ambulatory Visit: Payer: Self-pay | Admitting: Physician Assistant

## 2021-07-04 ENCOUNTER — Encounter: Payer: Self-pay | Admitting: Neurology

## 2021-07-05 DIAGNOSIS — G35 Multiple sclerosis: Secondary | ICD-10-CM | POA: Diagnosis not present

## 2021-07-08 ENCOUNTER — Other Ambulatory Visit: Payer: Self-pay | Admitting: Physician Assistant

## 2021-07-16 ENCOUNTER — Other Ambulatory Visit: Payer: Self-pay | Admitting: Physician Assistant

## 2021-07-29 ENCOUNTER — Other Ambulatory Visit: Payer: Self-pay | Admitting: Physician Assistant

## 2021-08-02 DIAGNOSIS — G35 Multiple sclerosis: Secondary | ICD-10-CM | POA: Diagnosis not present

## 2021-08-08 ENCOUNTER — Ambulatory Visit (INDEPENDENT_AMBULATORY_CARE_PROVIDER_SITE_OTHER): Payer: BC Managed Care – PPO | Admitting: Physician Assistant

## 2021-08-08 ENCOUNTER — Encounter: Payer: Self-pay | Admitting: Physician Assistant

## 2021-08-08 DIAGNOSIS — F319 Bipolar disorder, unspecified: Secondary | ICD-10-CM

## 2021-08-08 DIAGNOSIS — F4323 Adjustment disorder with mixed anxiety and depressed mood: Secondary | ICD-10-CM | POA: Diagnosis not present

## 2021-08-08 DIAGNOSIS — F411 Generalized anxiety disorder: Secondary | ICD-10-CM | POA: Diagnosis not present

## 2021-08-08 MED ORDER — LAMOTRIGINE 150 MG PO TABS: 150.0000 mg | ORAL_TABLET | Freq: Two times a day (BID) | ORAL | 5 refills | Status: DC

## 2021-08-08 MED ORDER — BUSPIRONE HCL 30 MG PO TABS: 30.0000 mg | ORAL_TABLET | Freq: Two times a day (BID) | ORAL | 5 refills | Status: DC

## 2021-08-08 NOTE — Progress Notes (Addendum)
[facility] ? ?Patient ID: [name],  ?MRN: [id number] ? ?PCP: [name], MD ? ?Date of Evaluation: 08/08/2021 ?Time spent:25 minutes ? ?Chief Complaint:  ?Chief Complaint   ?Anxiety; Follow-up ?  ? ?Virtual Visit via Telehealth ? ?I connected with patient by telephone, with their informed consent, and verified patient privacy and that I am speaking with the correct person using two identifiers.  I am private, in my office and the patient is at home. ? ?I discussed the limitations, risks, security and privacy concerns of performing an evaluation and management service by telephone and the availability of in person appointments. I also discussed with the patient that there may be a patient responsible charge related to this service. The patient expressed understanding and agreed to proceed. ?  ?I discussed the assessment and treatment plan with the patient. The patient was provided an opportunity to ask questions and all were answered. The patient agreed with the plan and demonstrated an understanding of the instructions. ?  ?The patient was advised to call back or seek an in-person evaluation if the symptoms worsen or if the condition fails to improve as anticipated. ? ?I provided 25 minutes of non-face-to-face time during this encounter. ? ? ?HISTORY/CURRENT STATUS: ?For routine med check. ? ?Overall she is doing well.  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  Her physical health has improved, she is not having to take the Xarelto any longer.  MS is stable and not on any new treatment for that.  She is able to function normally and does take the oxybutynin as needed.  ? ?She is able to enjoy things.  Denies decreased energy or motivation.  Appetite has not changed.  No extreme sadness, tearfulness, or feelings of hopelessness.  Denies any changes in concentration, making decisions or remembering things. Sleeps good most of the time. ADLs and personal hygiene nl.  Denies  suicidal or homicidal thoughts. ? ?Patient denies increased energy with decreased need for sleep, no increased talkativeness, no racing thoughts, no impulsivity or risky behaviors, no increased spending, no increased libido, no grandiosity, no increased irritability or anger, no paranoia, and no hallucinations. ? ?Denies dizziness, syncope, seizures, numbness, tingling, tremor, tics, unsteady gait, slurred speech, confusion. Denies muscle or joint pain, stiffness, or dystonia. Denies unexplained weight loss, frequent infections, or sores that heal slowly.  No polyphagia, polydipsia, or polyuria. Denies visual changes or paresthesias.  ? ?Individual Medical History/ Review of Systems: Changes? :No    ? ?Past medications for mental health diagnoses include: ?Lithium, Wellbutrin was not effective for depression or decreased desire for nicotine, Risperdal, Lamictal, Ativan, Chantix caused paranoia and increased anger ? ?Allergies: Patient has no known allergies. ? ?Current Medications:  ?Current Outpatient Medications:  ?  ALPRAZolam (XANAX) 1 MG tablet, Take 1 tablet (1 mg total) by mouth 3 (three) times daily as needed for anxiety., Disp: 90 tablet, Rfl: 2 ?  oxybutynin (DITROPAN) 5 MG tablet, Take 1 tablet (5 mg total) by mouth 3 (three) times daily., Disp: 60 tablet, Rfl: 11 ?  risperidone (RISPERDAL) 4 MG tablet, Take 1 tablet (4 mg total) by mouth daily., Disp: 90 tablet, Rfl: 0 ?  busPIRone (BUSPAR) 30 MG tablet, Take 1 tablet (30 mg total) by mouth 2 (two) times daily., Disp: 60 tablet, Rfl: 5 ?  ibuprofen (ADVIL) 200 MG tablet, Take 200 mg by mouth every 6 (six) hours as needed for headache or mild pain., Disp: , Rfl:  ?  lamoTRIgine (  LAMICTAL) 150 MG tablet, Take 1 tablet (150 mg total) by mouth 2 (two) times daily., Disp: 60 tablet, Rfl: 5 ?  XARELTO STARTER PACK, Take by mouth as directed. (Patient not taking: Reported on 08/08/2021), Disp: , Rfl:  ?Medication Side Effects: none ? ?Family Medical/ Social  History: Changes?  No ? ?MENTAL HEALTH EXAM: ? ?There were no vitals taken for this visit.There is no height or weight on file to calculate BMI.  ?General Appearance:  Unable to assess  ?Eye Contact:   Unable to assess  ?Speech:  Clear and Coherent and Normal Rate  ?Volume:  Normal  ?Mood:  Euthymic  ?Affect:   Unable to assess  ?Thought Process:  Goal Directed and Descriptions of Associations: Circumstantial  ?Orientation:  Full (Time, Place, and Person)  ?Thought Content: Logical   ?Suicidal Thoughts:  No  ?Homicidal Thoughts:  No  ?Memory:  WNL  ?Judgement:  Good  ?Insight:  Good  ?Psychomotor Activity:   Unable to assess  ?Concentration:  Concentration: Good and Attention Span: Good  ?Recall:  Good  ?Fund of Knowledge: Good  ?Language: Good  ?Assets:  Desire for Improvement  ?ADL's:  Intact  ?Cognition: WNL  ?Prognosis:  Good  ? ? ? ?DIAGNOSES:  ?  ICD-10-CM   ?1. Bipolar I disorder ([place])  F31.9   ?  ?2. Situational mixed anxiety and depressive disorder  F43.23   ?  ?3. Generalized anxiety disorder  F41.1   ?  ? ? ?Receiving Psychotherapy: No  ? ? ?RECOMMENDATIONS:  ?PDMP was reviewed.  Last Xanax 07/27/2021. ?I provided 25 minutes of non-face-to-face time during this encounter, including time spent before and after the visit in records review, medical decision making, counseling pertinent to today's visit, and charting.  ?I am glad she is doing better.  No changes in treatment are necessary. ?Vaping cessation discussed. ? ?Continue Xanax 1 mg to 1 p.o. 3 times daily as needed. ?Continue BuSpar 30 mg, 1 p.o. twice daily. ?Continue Lamictal 150 mg twice daily. ?Continue Risperdal 4 mg nightly. ?Return in 6 months. ? ?Donnal Moat, PA-C  ?

## 2021-08-08 NOTE — Telephone Encounter (Signed)
Patient's first tysabri infusion was on 08/02/2021 ?

## 2021-08-30 DIAGNOSIS — G35 Multiple sclerosis: Secondary | ICD-10-CM | POA: Diagnosis not present

## 2021-09-15 ENCOUNTER — Other Ambulatory Visit: Payer: Self-pay | Admitting: Physician Assistant

## 2021-09-20 ENCOUNTER — Ambulatory Visit (INDEPENDENT_AMBULATORY_CARE_PROVIDER_SITE_OTHER): Payer: BC Managed Care – PPO | Admitting: Neurology

## 2021-09-20 ENCOUNTER — Telehealth: Payer: Self-pay

## 2021-09-20 ENCOUNTER — Other Ambulatory Visit: Payer: Self-pay | Admitting: Student

## 2021-09-20 ENCOUNTER — Encounter: Payer: Self-pay | Admitting: Neurology

## 2021-09-20 VITALS — BP 143/78 | HR 107 | Ht 65.5 in | Wt 218.0 lb

## 2021-09-20 DIAGNOSIS — F319 Bipolar disorder, unspecified: Secondary | ICD-10-CM

## 2021-09-20 DIAGNOSIS — G35 Multiple sclerosis: Secondary | ICD-10-CM | POA: Diagnosis not present

## 2021-09-20 DIAGNOSIS — M545 Low back pain, unspecified: Secondary | ICD-10-CM

## 2021-09-20 DIAGNOSIS — R269 Unspecified abnormalities of gait and mobility: Secondary | ICD-10-CM

## 2021-09-20 DIAGNOSIS — Z79899 Other long term (current) drug therapy: Secondary | ICD-10-CM

## 2021-09-20 DIAGNOSIS — R3915 Urgency of urination: Secondary | ICD-10-CM

## 2021-09-20 MED ORDER — ETODOLAC 400 MG PO TABS: 400.0000 mg | ORAL_TABLET | Freq: Two times a day (BID) | ORAL | 5 refills | Status: DC

## 2021-09-20 NOTE — Telephone Encounter (Signed)
JCV collected and place in [place] for pickup.
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
## 2021-09-21 LAB — CBC WITH DIFFERENTIAL/PLATELET
Basophils Absolute: 0.1 10*3/uL (ref 0.0–0.2)
Basos: 1 %
EOS (ABSOLUTE): 0.1 10*3/uL (ref 0.0–0.4)
Eos: 1 %
Hematocrit: 22.9 % — ABNORMAL LOW (ref 34.0–46.6)
Hemoglobin: 8.3 g/dL — ABNORMAL LOW (ref 11.1–15.9)
Immature Grans (Abs): 0.2 10*3/uL — ABNORMAL HIGH (ref 0.0–0.1)
Immature Granulocytes: 2 %
Lymphocytes Absolute: 2.5 10*3/uL (ref 0.7–3.1)
Lymphs: 32 %
MCH: 34.9 pg — ABNORMAL HIGH (ref 26.6–33.0)
MCHC: 36.2 g/dL — ABNORMAL HIGH (ref 31.5–35.7)
MCV: 96 fL (ref 79–97)
Monocytes Absolute: 0.7 10*3/uL (ref 0.1–0.9)
Monocytes: 9 %
NRBC: 2 % — ABNORMAL HIGH (ref 0–0)
Neutrophils Absolute: 4.4 10*3/uL (ref 1.4–7.0)
Neutrophils: 55 %
Platelets: 284 10*3/uL (ref 150–450)
RBC: 2.38 x10E6/uL — CL (ref 3.77–5.28)
RDW: 18.2 % — ABNORMAL HIGH (ref 11.7–15.4)
WBC: 8 10*3/uL (ref 3.4–10.8)

## 2021-09-22 ENCOUNTER — Other Ambulatory Visit: Payer: Self-pay | Admitting: Physician Assistant

## 2021-09-22 ENCOUNTER — Telehealth: Payer: Self-pay | Admitting: Neurology

## 2021-09-22 DIAGNOSIS — D649 Anemia, unspecified: Secondary | ICD-10-CM

## 2021-09-22 NOTE — Telephone Encounter (Signed)
At her visit yesterday we checked a blood count along with another lab for her MS.  The hemoglobin was significantly low at 8.3.  MCV was normal but RDW was increased to 18 (had been just 12,  5 to 6 months ago).  She no longer has periods and notes no blood loss.  She has some fatigue but this has been a problem for her off and on for the last couple of years and is not different.   She comes back to the office next Tuesday to have her Tysabri infusion.  I will go ahead and recheck a CBC at that time and also check ferritin, B12 and folate and reticulocyte's.  If the results are repeated I would want her to see her primary care physician about the anemia as she may need additional work-up.  I spoke to her to tell her the results and plan

## 2021-09-23 NOTE — Telephone Encounter (Signed)
Last filled 5/10

## 2021-09-27 ENCOUNTER — Encounter: Payer: Self-pay | Admitting: Neurology

## 2021-09-27 ENCOUNTER — Other Ambulatory Visit: Payer: Self-pay

## 2021-09-27 ENCOUNTER — Other Ambulatory Visit (INDEPENDENT_AMBULATORY_CARE_PROVIDER_SITE_OTHER): Payer: BC Managed Care – PPO

## 2021-09-27 ENCOUNTER — Telehealth: Payer: Self-pay

## 2021-09-27 DIAGNOSIS — D529 Folate deficiency anemia, unspecified: Secondary | ICD-10-CM | POA: Diagnosis not present

## 2021-09-27 DIAGNOSIS — D519 Vitamin B12 deficiency anemia, unspecified: Secondary | ICD-10-CM | POA: Diagnosis not present

## 2021-09-27 DIAGNOSIS — G35 Multiple sclerosis: Secondary | ICD-10-CM | POA: Diagnosis not present

## 2021-09-27 DIAGNOSIS — R799 Abnormal finding of blood chemistry, unspecified: Secondary | ICD-10-CM | POA: Diagnosis not present

## 2021-09-27 DIAGNOSIS — E611 Iron deficiency: Secondary | ICD-10-CM | POA: Diagnosis not present

## 2021-09-27 DIAGNOSIS — Z0289 Encounter for other administrative examinations: Secondary | ICD-10-CM

## 2021-09-27 DIAGNOSIS — D649 Anemia, unspecified: Secondary | ICD-10-CM

## 2021-09-27 NOTE — Telephone Encounter (Signed)
JCV Antibody came back as indeterminate Index value was 0.26 (H) The inhibition assay was negative  This is slightly elevated from the feb 2023. Will send to Dr Felecia Shelling for review.

## 2021-09-27 NOTE — Telephone Encounter (Signed)
JCV collected and place in [place] for pickup.

## 2021-09-28 ENCOUNTER — Telehealth: Payer: Self-pay | Admitting: Neurology

## 2021-09-28 DIAGNOSIS — D649 Anemia, unspecified: Secondary | ICD-10-CM

## 2021-09-28 DIAGNOSIS — R768 Other specified abnormal immunological findings in serum: Secondary | ICD-10-CM

## 2021-09-28 DIAGNOSIS — G35 Multiple sclerosis: Secondary | ICD-10-CM

## 2021-09-28 NOTE — Telephone Encounter (Signed)
I spoke to [facility] about the repeat CBC.  We had initially checked labs last week (routine every 5 to 6 months).  The repeat CBC confirms the anemia.  Her hemoglobin is 7.9.  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    She notes some fatigue but this has been a chronic problem for her even when her hemoglobin was 13.  She notes no difference between last week and this week.  Because of the cold agglutinins that might be part of a hemolytic anemia requiring treatment (i.e. Rituxan), I am requesting an urgent hematology appointment.    I advised her that if she notes significant change in fatigue or lightheadedness that she could go to the emergency room.

## 2021-09-29 ENCOUNTER — Encounter: Payer: Self-pay | Admitting: Neurology

## 2021-09-29 ENCOUNTER — Telehealth: Payer: Self-pay | Admitting: Hematology and Oncology

## 2021-09-29 LAB — FOLATE: Folate: 2.8 ng/mL — ABNORMAL LOW (ref >3.0)

## 2021-09-29 LAB — CBC
Hematocrit: 21.8 % — ABNORMAL LOW (ref 34.0–46.6)
Hemoglobin: 7.9 g/dL — ABNORMAL LOW (ref 11.1–15.9)
MCH: 34.2 pg — ABNORMAL HIGH (ref 26.6–33.0)
MCHC: 36.2 g/dL — ABNORMAL HIGH (ref 31.5–35.7)
MCV: 94 fL (ref 79–97)
NRBC: 2 % — ABNORMAL HIGH (ref 0–0)
Platelets: 249 10*3/uL (ref 150–450)
RBC: 2.31 x10E6/uL — CL (ref 3.77–5.28)
RDW: 19 % — ABNORMAL HIGH (ref 11.7–15.4)
WBC: 7 10*3/uL (ref 3.4–10.8)

## 2021-09-29 LAB — RETICULOCYTES: Retic Ct Pct: 6.8 % — ABNORMAL HIGH (ref 0.6–2.6)

## 2021-09-29 LAB — FERRITIN: Ferritin: 492 ng/mL — ABNORMAL HIGH (ref 15–150)

## 2021-09-29 LAB — COLD AGGLUTININ TITER: Cold Agglutinin Titer: 1:32 {titer} — ABNORMAL HIGH

## 2021-09-29 LAB — VITAMIN B12: Vitamin B-12: 323 pg/mL (ref 232–1245)

## 2021-09-29 NOTE — Telephone Encounter (Signed)
Scheduled appt per 6/14 referral. Pt is aware of appt date and time. Pt is aware to arrive 15 mins prior to appt time and to bring and updated insurance card. Pt is aware of appt location.   

## 2021-10-05 NOTE — Telephone Encounter (Signed)
JCV ab drawn on 09/27/21 indeterminate, index: 0.20. Inhibition assay: negative. Gave to MD for review.

## 2021-10-06 ENCOUNTER — Other Ambulatory Visit: Payer: Self-pay

## 2021-10-06 ENCOUNTER — Inpatient Hospital Stay: Payer: BC Managed Care – PPO

## 2021-10-06 ENCOUNTER — Inpatient Hospital Stay: Payer: BC Managed Care – PPO | Attending: Hematology and Oncology | Admitting: Hematology and Oncology

## 2021-10-06 ENCOUNTER — Encounter: Payer: Self-pay | Admitting: Hematology and Oncology

## 2021-10-06 VITALS — BP 111/69 | HR 89 | Temp 98.0°F | Resp 18 | Ht 65.5 in | Wt 217.4 lb

## 2021-10-06 DIAGNOSIS — F17290 Nicotine dependence, other tobacco product, uncomplicated: Secondary | ICD-10-CM | POA: Diagnosis not present

## 2021-10-06 DIAGNOSIS — B1920 Unspecified viral hepatitis C without hepatic coma: Secondary | ICD-10-CM | POA: Insufficient documentation

## 2021-10-06 DIAGNOSIS — E559 Vitamin D deficiency, unspecified: Secondary | ICD-10-CM | POA: Diagnosis not present

## 2021-10-06 DIAGNOSIS — R9389 Abnormal findings on diagnostic imaging of other specified body structures: Secondary | ICD-10-CM

## 2021-10-06 DIAGNOSIS — E538 Deficiency of other specified B group vitamins: Secondary | ICD-10-CM | POA: Diagnosis not present

## 2021-10-06 DIAGNOSIS — Z8249 Family history of ischemic heart disease and other diseases of the circulatory system: Secondary | ICD-10-CM | POA: Diagnosis not present

## 2021-10-06 DIAGNOSIS — U070 Vaping-related disorder: Secondary | ICD-10-CM | POA: Insufficient documentation

## 2021-10-06 DIAGNOSIS — Z86718 Personal history of other venous thrombosis and embolism: Secondary | ICD-10-CM | POA: Diagnosis not present

## 2021-10-06 DIAGNOSIS — E041 Nontoxic single thyroid nodule: Secondary | ICD-10-CM | POA: Diagnosis not present

## 2021-10-06 DIAGNOSIS — E0500 Thyrotoxicosis with diffuse goiter without thyrotoxic crisis or storm: Secondary | ICD-10-CM

## 2021-10-06 DIAGNOSIS — D5912 Cold autoimmune hemolytic anemia: Secondary | ICD-10-CM | POA: Diagnosis not present

## 2021-10-06 DIAGNOSIS — F319 Bipolar disorder, unspecified: Secondary | ICD-10-CM | POA: Insufficient documentation

## 2021-10-06 DIAGNOSIS — Z79899 Other long term (current) drug therapy: Secondary | ICD-10-CM | POA: Insufficient documentation

## 2021-10-06 DIAGNOSIS — G35 Multiple sclerosis: Secondary | ICD-10-CM | POA: Diagnosis not present

## 2021-10-06 DIAGNOSIS — B1710 Acute hepatitis C without hepatic coma: Secondary | ICD-10-CM

## 2021-10-06 LAB — URINALYSIS, COMPLETE (UACMP) WITH MICROSCOPIC
Bilirubin Urine: NEGATIVE
Glucose, UA: NEGATIVE mg/dL
Hgb urine dipstick: NEGATIVE
Ketones, ur: NEGATIVE mg/dL
Leukocytes,Ua: NEGATIVE
Nitrite: NEGATIVE
Protein, ur: NEGATIVE mg/dL
Specific Gravity, Urine: 1.008 (ref 1.005–1.030)
pH: 6 (ref 5.0–8.0)

## 2021-10-06 LAB — CBC WITH DIFFERENTIAL (CANCER CENTER ONLY)
Abs Immature Granulocytes: 0.1 10*3/uL — ABNORMAL HIGH (ref 0.00–0.07)
Basophils Absolute: 0.1 10*3/uL (ref 0.0–0.1)
Basophils Relative: 1 %
Eosinophils Absolute: 0.1 10*3/uL (ref 0.0–0.5)
Eosinophils Relative: 1 %
HCT: 21.8 % — ABNORMAL LOW (ref 36.0–46.0)
Hemoglobin: 7.9 g/dL — ABNORMAL LOW (ref 12.0–15.0)
Immature Granulocytes: 1 %
Lymphocytes Relative: 33 %
Lymphs Abs: 2.4 10*3/uL (ref 0.7–4.0)
MCH: 35.9 pg — ABNORMAL HIGH (ref 26.0–34.0)
MCHC: 36.2 g/dL — ABNORMAL HIGH (ref 30.0–36.0)
MCV: 99.1 fL (ref 80.0–100.0)
Monocytes Absolute: 0.6 10*3/uL (ref 0.1–1.0)
Monocytes Relative: 9 %
Neutro Abs: 4 10*3/uL (ref 1.7–7.7)
Neutrophils Relative %: 55 %
Platelet Count: 225 10*3/uL (ref 150–400)
RBC: 2.2 MIL/uL — ABNORMAL LOW (ref 3.87–5.11)
RDW: 19.9 % — ABNORMAL HIGH (ref 11.5–15.5)
WBC Count: 7.3 10*3/uL (ref 4.0–10.5)
nRBC: 2.3 % — ABNORMAL HIGH (ref 0.0–0.2)

## 2021-10-06 LAB — DIRECT ANTIGLOBULIN TEST (NOT AT ARMC)
DAT, IgG: NEGATIVE
DAT, complement: POSITIVE

## 2021-10-06 LAB — CMP (CANCER CENTER ONLY)
ALT: 14 U/L (ref 0–44)
AST: 15 U/L (ref 15–41)
Albumin: 4.5 g/dL (ref 3.5–5.0)
Alkaline Phosphatase: 95 U/L (ref 38–126)
Anion gap: 5 (ref 5–15)
BUN: 14 mg/dL (ref 6–20)
CO2: 27 mmol/L (ref 22–32)
Calcium: 10.8 mg/dL — ABNORMAL HIGH (ref 8.9–10.3)
Chloride: 108 mmol/L (ref 98–111)
Creatinine: 0.91 mg/dL (ref 0.44–1.00)
GFR, Estimated: >60 mL/min (ref >60)
Glucose, Bld: 89 mg/dL (ref 70–99)
Potassium: 3.7 mmol/L (ref 3.5–5.1)
Sodium: 140 mmol/L (ref 135–145)
Total Bilirubin: 3 mg/dL — ABNORMAL HIGH (ref 0.3–1.2)
Total Protein: 7.1 g/dL (ref 6.5–8.1)

## 2021-10-06 LAB — HEPATITIS B SURFACE ANTIBODY,QUALITATIVE: Hep B S Ab: REACTIVE — AB

## 2021-10-06 LAB — HEPATITIS B CORE ANTIBODY, IGM: Hep B C IgM: NONREACTIVE

## 2021-10-06 LAB — LACTATE DEHYDROGENASE: LDH: 283 U/L — ABNORMAL HIGH (ref 98–192)

## 2021-10-06 LAB — SAMPLE TO BLOOD BANK

## 2021-10-06 LAB — ABO/RH: ABO/RH(D): B POS

## 2021-10-06 LAB — RETICULOCYTES
Immature Retic Fract: 22.2 % — ABNORMAL HIGH (ref 2.3–15.9)
RBC.: 2.18 MIL/uL — ABNORMAL LOW (ref 3.87–5.11)
Retic Count, Absolute: 168.7 10*3/uL (ref 19.0–186.0)
Retic Ct Pct: 7.7 % — ABNORMAL HIGH (ref 0.4–3.1)

## 2021-10-06 LAB — HEPATITIS B SURFACE ANTIGEN: Hepatitis B Surface Ag: NONREACTIVE

## 2021-10-06 LAB — T4, FREE: Free T4: 0.66 ng/dL (ref 0.61–1.12)

## 2021-10-06 NOTE — Progress Notes (Signed)
Per Dr. Alvy Bimler directions - patient waiting in lobby for blood count results. HGB today is 7.9. Informed Ms. Cragun of HGB and told she is free to leave. Advised her to take supplements as directed/discussed by Dr. Alvy Bimler during appt and she should schedule CT scan. CC scheduler will contact her regarding follow up w/Dr. Alvy Bimler. Patient and parents verbalized understanding.

## 2021-10-07 ENCOUNTER — Telehealth: Payer: Self-pay | Admitting: *Deleted

## 2021-10-07 ENCOUNTER — Other Ambulatory Visit: Payer: Self-pay | Admitting: Hematology and Oncology

## 2021-10-07 ENCOUNTER — Encounter: Payer: Self-pay | Admitting: Hematology and Oncology

## 2021-10-07 DIAGNOSIS — I2699 Other pulmonary embolism without acute cor pulmonale: Secondary | ICD-10-CM | POA: Insufficient documentation

## 2021-10-07 DIAGNOSIS — D598 Other acquired hemolytic anemias: Secondary | ICD-10-CM

## 2021-10-07 DIAGNOSIS — E559 Vitamin D deficiency, unspecified: Secondary | ICD-10-CM

## 2021-10-07 DIAGNOSIS — Z86718 Personal history of other venous thrombosis and embolism: Secondary | ICD-10-CM | POA: Insufficient documentation

## 2021-10-07 DIAGNOSIS — D5912 Cold autoimmune hemolytic anemia: Secondary | ICD-10-CM

## 2021-10-07 DIAGNOSIS — E538 Deficiency of other specified B group vitamins: Secondary | ICD-10-CM

## 2021-10-07 DIAGNOSIS — F17290 Nicotine dependence, other tobacco product, uncomplicated: Secondary | ICD-10-CM | POA: Insufficient documentation

## 2021-10-07 HISTORY — DX: Deficiency of other specified B group vitamins: E53.8

## 2021-10-07 HISTORY — DX: Vitamin D deficiency, unspecified: E55.9

## 2021-10-07 LAB — HAPTOGLOBIN: Haptoglobin: <10 mg/dL — ABNORMAL LOW (ref 42–296)

## 2021-10-07 LAB — TSH: TSH: 2.595 u[IU]/mL (ref 0.350–4.500)

## 2021-10-07 LAB — HCV RNA QUANT RFLX ULTRA OR GENOTYP
HCV RNA Qnt(log copy/mL): UNDETERMINED log10 IU/mL
HepC Qn: NOT DETECTED IU/mL

## 2021-10-07 NOTE — Assessment & Plan Note (Signed)
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

## 2021-10-07 NOTE — Assessment & Plan Note (Signed)
She was treated with radioactive iodine a long time ago for Graves' disease We will repeat thyroid function test for evaluation

## 2021-10-07 NOTE — Assessment & Plan Note (Signed)
I recommend high-dose over-the-counter vitamin D supplement

## 2021-10-07 NOTE — Assessment & Plan Note (Signed)
It is not clear to me whether her disease is triggered by chemical agents associated with vaping We discussed importance of cessation of this behavior

## 2021-10-10 ENCOUNTER — Telehealth: Payer: Self-pay | Admitting: Hematology and Oncology

## 2021-10-10 NOTE — Telephone Encounter (Signed)
Scheduled appointment per [facility] RN (6/23 request). Left message.

## 2021-10-12 LAB — MULTIPLE MYELOMA PANEL, SERUM
Albumin SerPl Elph-Mcnc: 4.2 g/dL (ref 2.9–4.4)
Albumin/Glob SerPl: 1.7 (ref 0.7–1.7)
Alpha 1: 0.2 g/dL (ref 0.0–0.4)
Alpha2 Glob SerPl Elph-Mcnc: 0.4 g/dL (ref 0.4–1.0)
B-Globulin SerPl Elph-Mcnc: 0.8 g/dL (ref 0.7–1.3)
Gamma Glob SerPl Elph-Mcnc: 1.1 g/dL (ref 0.4–1.8)
Globulin, Total: 2.5 g/dL (ref 2.2–3.9)
IgA: 218 mg/dL (ref 87–352)
IgG (Immunoglobin G), Serum: 1044 mg/dL (ref 586–1602)
IgM (Immunoglobulin M), Srm: 301 mg/dL — ABNORMAL HIGH (ref 26–217)
M Protein SerPl Elph-Mcnc: 0.4 g/dL — ABNORMAL HIGH
Total Protein ELP: 6.7 g/dL (ref 6.0–8.5)

## 2021-10-12 LAB — PNH PROFILE (-HIGH SENSITIVITY)

## 2021-10-13 ENCOUNTER — Ambulatory Visit (HOSPITAL_COMMUNITY)
Admission: RE | Admit: 2021-10-13 | Discharge: 2021-10-13 | Disposition: A | Discharge status: Home / Self Care | Payer: BC Managed Care – PPO | Source: Ambulatory Visit | Attending: Hematology and Oncology | Admitting: Hematology and Oncology

## 2021-10-13 DIAGNOSIS — I2699 Other pulmonary embolism without acute cor pulmonale: Secondary | ICD-10-CM | POA: Diagnosis not present

## 2021-10-13 DIAGNOSIS — Q676 Pectus excavatum: Secondary | ICD-10-CM | POA: Diagnosis not present

## 2021-10-13 DIAGNOSIS — J984 Other disorders of lung: Secondary | ICD-10-CM | POA: Diagnosis not present

## 2021-10-13 DIAGNOSIS — I82411 Acute embolism and thrombosis of right femoral vein: Secondary | ICD-10-CM | POA: Diagnosis not present

## 2021-10-13 DIAGNOSIS — D5912 Cold autoimmune hemolytic anemia: Secondary | ICD-10-CM | POA: Insufficient documentation

## 2021-10-13 DIAGNOSIS — K8020 Calculus of gallbladder without cholecystitis without obstruction: Secondary | ICD-10-CM | POA: Diagnosis not present

## 2021-10-13 DIAGNOSIS — J90 Pleural effusion, not elsewhere classified: Secondary | ICD-10-CM | POA: Diagnosis not present

## 2021-10-13 DIAGNOSIS — K429 Umbilical hernia without obstruction or gangrene: Secondary | ICD-10-CM | POA: Diagnosis not present

## 2021-10-13 DIAGNOSIS — R161 Splenomegaly, not elsewhere classified: Secondary | ICD-10-CM | POA: Diagnosis not present

## 2021-10-13 MED ORDER — SODIUM CHLORIDE (PF) 0.9 % IJ SOLN
INTRAMUSCULAR | Status: AC
  Filled 2021-10-13: qty 50

## 2021-10-13 MED ORDER — IOHEXOL 300 MG/ML  SOLN
100.0000 mL | Freq: Once | INTRAMUSCULAR | Status: AC | PRN
Start: 2021-10-13 — End: 2021-10-13
  Administered 2021-10-13: 100 mL via INTRAVENOUS

## 2021-10-14 ENCOUNTER — Inpatient Hospital Stay: Payer: BC Managed Care – PPO | Admitting: Hematology and Oncology

## 2021-10-14 ENCOUNTER — Encounter: Payer: Self-pay | Admitting: Hematology and Oncology

## 2021-10-14 ENCOUNTER — Telehealth: Payer: Self-pay

## 2021-10-14 ENCOUNTER — Other Ambulatory Visit (HOSPITAL_COMMUNITY): Payer: Self-pay

## 2021-10-14 ENCOUNTER — Inpatient Hospital Stay: Payer: BC Managed Care – PPO

## 2021-10-14 ENCOUNTER — Inpatient Hospital Stay (HOSPITAL_BASED_OUTPATIENT_CLINIC_OR_DEPARTMENT_OTHER): Payer: BC Managed Care – PPO | Admitting: Hematology and Oncology

## 2021-10-14 ENCOUNTER — Other Ambulatory Visit: Payer: Self-pay

## 2021-10-14 VITALS — BP 130/67 | HR 99 | Temp 98.9°F | Resp 18 | Ht 65.5 in | Wt 215.8 lb

## 2021-10-14 DIAGNOSIS — B1920 Unspecified viral hepatitis C without hepatic coma: Secondary | ICD-10-CM | POA: Diagnosis not present

## 2021-10-14 DIAGNOSIS — D5912 Cold autoimmune hemolytic anemia: Secondary | ICD-10-CM | POA: Diagnosis not present

## 2021-10-14 DIAGNOSIS — E538 Deficiency of other specified B group vitamins: Secondary | ICD-10-CM

## 2021-10-14 DIAGNOSIS — F17290 Nicotine dependence, other tobacco product, uncomplicated: Secondary | ICD-10-CM | POA: Diagnosis not present

## 2021-10-14 DIAGNOSIS — Z86718 Personal history of other venous thrombosis and embolism: Secondary | ICD-10-CM | POA: Diagnosis not present

## 2021-10-14 DIAGNOSIS — E041 Nontoxic single thyroid nodule: Secondary | ICD-10-CM | POA: Diagnosis not present

## 2021-10-14 DIAGNOSIS — G35 Multiple sclerosis: Secondary | ICD-10-CM | POA: Diagnosis not present

## 2021-10-14 DIAGNOSIS — Z79899 Other long term (current) drug therapy: Secondary | ICD-10-CM | POA: Diagnosis not present

## 2021-10-14 DIAGNOSIS — U070 Vaping-related disorder: Secondary | ICD-10-CM | POA: Diagnosis not present

## 2021-10-14 DIAGNOSIS — D598 Other acquired hemolytic anemias: Secondary | ICD-10-CM

## 2021-10-14 DIAGNOSIS — Z8249 Family history of ischemic heart disease and other diseases of the circulatory system: Secondary | ICD-10-CM | POA: Diagnosis not present

## 2021-10-14 DIAGNOSIS — F319 Bipolar disorder, unspecified: Secondary | ICD-10-CM | POA: Diagnosis not present

## 2021-10-14 DIAGNOSIS — E559 Vitamin D deficiency, unspecified: Secondary | ICD-10-CM | POA: Diagnosis not present

## 2021-10-14 LAB — SAMPLE TO BLOOD BANK

## 2021-10-14 LAB — CBC WITH DIFFERENTIAL/PLATELET
Abs Immature Granulocytes: 0.09 10*3/uL — ABNORMAL HIGH (ref 0.00–0.07)
Basophils Absolute: 0.1 10*3/uL (ref 0.0–0.1)
Basophils Relative: 1 %
Eosinophils Absolute: 0.1 10*3/uL (ref 0.0–0.5)
Eosinophils Relative: 2 %
HCT: 20.8 % — ABNORMAL LOW (ref 36.0–46.0)
Hemoglobin: 7.5 g/dL — ABNORMAL LOW (ref 12.0–15.0)
Immature Granulocytes: 1 %
Lymphocytes Relative: 29 %
Lymphs Abs: 2 10*3/uL (ref 0.7–4.0)
MCH: 36.4 pg — ABNORMAL HIGH (ref 26.0–34.0)
MCHC: 36.1 g/dL — ABNORMAL HIGH (ref 30.0–36.0)
MCV: 101 fL — ABNORMAL HIGH (ref 80.0–100.0)
Monocytes Absolute: 0.6 10*3/uL (ref 0.1–1.0)
Monocytes Relative: 8 %
Neutro Abs: 4.3 10*3/uL (ref 1.7–7.7)
Neutrophils Relative %: 59 %
Platelets: 220 10*3/uL (ref 150–400)
RBC: 2.06 MIL/uL — ABNORMAL LOW (ref 3.87–5.11)
RDW: 19.6 % — ABNORMAL HIGH (ref 11.5–15.5)
WBC: 7.1 10*3/uL (ref 4.0–10.5)
nRBC: 2.9 % — ABNORMAL HIGH (ref 0.0–0.2)

## 2021-10-14 LAB — PREPARE RBC (CROSSMATCH)

## 2021-10-14 MED ORDER — RIVAROXABAN (XARELTO) VTE STARTER PACK (15 & 20 MG)
ORAL_TABLET | ORAL | 0 refills | Status: DC
  Filled 2021-10-14: qty 51, 30d supply, fill #0

## 2021-10-14 MED ORDER — ENOXAPARIN SODIUM 100 MG/ML IJ SOSY
100.0000 mg | PREFILLED_SYRINGE | Freq: Once | INTRAMUSCULAR | Status: AC
  Administered 2021-10-14: 100 mg via SUBCUTANEOUS
  Filled 2021-10-14: qty 1

## 2021-10-14 NOTE — Progress Notes (Signed)
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 The patient is symptomatic from anemia and the hemoglobin level is critically low.  Some of the side-effects to be expected including risks of transfusion reactions, chills, infection, syndrome of volume overload and risk of hospitalization from various reasons and the patient is willing to proceed and went ahead to sign consent today. We will arrange for 1 unit of blood tomorrow We discussed the role of immunotherapy with rituximab The risk, benefits, side effects were discussed and she is in agreement to proceed I will see her weekly when she returns for treatment next month   History of DVT (deep vein thrombosis) CT imaging revealed persistent right lower extremity DVT and signs of PE Thankfully, she is not symptomatic I recommend 1 dose of Lovenox today We will get authorization and arrange for her to take Xarelto I recommend loading dose  Orders Placed This Encounter  Procedures   CBC with Differential ([place] Only)    Standing Status:   Standing    Number of Occurrences:   20    Standing Expiration Date:   10/15/2022   CMP ([facility] only)    Standing Status:   Standing    Number of Occurrences:   20    Standing Expiration Date:   10/15/2022   Informed Consent Details: Physician/Practitioner Attestation; Transcribe to consent form and obtain patient signature    Standing Status:   Future    Standing Expiration Date:   10/15/2022    Order Specific Question:   Physician/Practitioner attestation of informed consent for blood  and or blood product transfusion    Answer:   I, the physician/practitioner, attest that I have discussed with the patient the benefits, risks, side effects, alternatives, likelihood of achieving goals and potential problems during recovery for the procedure that I have provided informed consent.    Order Specific Question:   Product(s)    Answer:   All Product(s)   Care order/instruction    Transfuse Parameters    Standing Status:   Future    Standing Expiration Date:   10/14/2022   Type and screen         Standing Status:   Future    Number of Occurrences:   1    Standing Expiration Date:   10/15/2022   Prepare RBC (crossmatch)    Standing Status:   Standing    Number of Occurrences:   1    Order Specific Question:   # of Units    Answer:   1 unit    Order Specific Question:   Transfusion Indications    Answer:   Symptomatic Anemia    Order Specific Question:   Number of Units to Keep Ahead    Answer:   NO units ahead    Order Specific Question:   Instructions:    Answer:   Transfuse    Order Specific Question:   If emergent release call blood bank    Answer:   Not emergent release    The total time spent in the appointment was 55 minutes encounter with patients including review of  chart and various tests results, discussions about plan of care and coordination of care plan   All questions were answered. The patient knows to call the clinic with any problems, questions or concerns. No barriers to learning was detected.    Judith Lark, MD 6/30/20234:03 PM  INTERVAL HISTORY: [name] 46 y.o. female returns for urgent evaluation and review of test results She is here accompanied by her mother-in-law She denies recent leg pain or chest pain or shortness of breath She is somewhat symptomatic with some dizziness The patient denies any recent signs or symptoms of bleeding such as spontaneous epistaxis, hematuria or hematochezia.   SUMMARY OF HEMATOLOGIC HISTORY: [name] 46 y.o. 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  She also have history of DVT.  The patient currently uses tobacco products with vaping on a regular basis.  She has recent weakness and dizziness.  She had extensive work-up in January and recently.  She was found to have elevated homocystine level, borderline B12 deficiency, folate deficiency and vitamin D deficiency.  She has not been taking these vitamin replacement products  She was found to have abnormal CBC from recent blood work Her most recent CBC showed hemoglobin of 7.9 Reticulocyte count is inappropriately low for the degree of anemia She was also noticing passage of dark urine She denies recent chest pain on exertion, shortness of breath on minimal exertion, pre-syncopal episodes, or palpitations. She had not noticed any recent bleeding such as epistaxis, hematuria or hematochezia The patient denies over the counter NSAID ingestion. She is not on antiplatelets agents.  She had no prior history or diagnosis of cancer. Her age appropriate screening programs are up-to-date. She denies any pica and eats a variety of diet. She never donated blood or received blood transfusion CT imaging on October 14, 2021 showed evidence of DVT and PE, mild splenomegaly but no evidence of lymphoma  I have reviewed the past medical history, past surgical history, social history and family history with the patient and they are unchanged from previous note.  ALLERGIES:  has No Known Allergies.  MEDICATIONS:  Current Outpatient Medications  Medication Sig Dispense Refill   RIVAROXABAN  (XARELTO) VTE STARTER PACK (15 & 20 MG) Follow package directions: Take one '15mg'$  tablet by mouth twice a day. On day 22, switch to one '20mg'$  tablet once a day. Take with food. 51 each 0   ALPRAZolam (XANAX) 1 MG tablet TAKE 1 TABLET BY MOUTH 3 TIMES DAILY AS NEEDED FOR ANXIETY. 90 tablet 2   busPIRone (BUSPAR) 30 MG tablet TAKE 1 TABLET BY MOUTH 2 TIMES DAILY. 180 tablet 0   cholecalciferol (VITAMIN D3) 25 MCG (1000 UNIT) tablet Take 2,000 Units by mouth daily.     folic acid (FOLVITE) 1 MG tablet Take 1 mg by mouth daily.     lamoTRIgine (LAMICTAL) 150 MG tablet Take 1 tablet (150 mg total) by mouth 2 (two) times daily. 60 tablet 5   risperidone (RISPERDAL) 4 MG tablet TAKE 1 TABLET DAILY 90 tablet 3   vitamin B-12 (CYANOCOBALAMIN) 1000 MCG tablet Take 1,000 mcg by mouth daily.  No current facility-administered medications for this visit.     REVIEW OF SYSTEMS:   Constitutional: Denies fevers, chills or night sweats Eyes: Denies blurriness of vision Ears, nose, mouth, throat, and face: Denies mucositis or sore throat Respiratory: Denies cough, dyspnea or wheezes Cardiovascular: Denies palpitation, chest discomfort or lower extremity swelling Gastrointestinal:  Denies nausea, heartburn or change in bowel habits Skin: Denies abnormal skin rashes Lymphatics: Denies new lymphadenopathy or easy bruising Neurological:Denies numbness, tingling or new weaknesses Behavioral/Psych: Mood is stable, no new changes  All other systems were reviewed with the patient and are negative.  PHYSICAL EXAMINATION: ECOG PERFORMANCE STATUS: 1 - Symptomatic but completely ambulatory  Vitals:   10/14/21 1450  BP: 130/67  Pulse: 99  Resp: 18  Temp: 98.9 F (37.2 C)  SpO2: 97%   Filed Weights   10/14/21 1450  Weight: 215 lb 12.8 oz (97.9 kg)    GENERAL:alert, no distress and comfortable NEURO: alert & oriented x 3 with fluent speech, no focal motor/sensory deficits  LABORATORY DATA:  I have  reviewed the data as listed     Component Value Date/Time   NA 140 10/06/2021 1435   K 3.7 10/06/2021 1435   CL 108 10/06/2021 1435   CO2 27 10/06/2021 1435   GLUCOSE 89 10/06/2021 1435   BUN 14 10/06/2021 1435   CREATININE 0.91 10/06/2021 1435   CALCIUM 10.8 (H) 10/06/2021 1435   CALCIUM 10.4 (H) 04/22/2021 0119   PROT 7.1 10/06/2021 1435   ALBUMIN 4.5 10/06/2021 1435   AST 15 10/06/2021 1435   ALT 14 10/06/2021 1435   ALKPHOS 95 10/06/2021 1435   BILITOT 3.0 (H) 10/06/2021 1435   GFRNONAA >60 10/06/2021 1435   GFRAA >90 12/27/2013 1827    No results found for: "SPEP", "UPEP"  Lab Results  Component Value Date   WBC 7.1 10/14/2021   NEUTROABS 4.3 10/14/2021   HGB 7.5 (L) 10/14/2021   HCT 20.8 (L) 10/14/2021   MCV 101.0 (H) 10/14/2021   PLT 220 10/14/2021      Chemistry      Component Value Date/Time   NA 140 10/06/2021 1435   K 3.7 10/06/2021 1435   CL 108 10/06/2021 1435   CO2 27 10/06/2021 1435   BUN 14 10/06/2021 1435   CREATININE 0.91 10/06/2021 1435      Component Value Date/Time   CALCIUM 10.8 (H) 10/06/2021 1435   CALCIUM 10.4 (H) 04/22/2021 0119   ALKPHOS 95 10/06/2021 1435   AST 15 10/06/2021 1435   ALT 14 10/06/2021 1435   BILITOT 3.0 (H) 10/06/2021 1435       RADIOGRAPHIC STUDIES: I have personally reviewed the radiological images as listed and agreed with the findings in the report. CT CHEST ABDOMEN PELVIS W CONTRAST  Result Date: 10/14/2021 CLINICAL DATA:  A 46 year old female presents for evaluation of suspected hematologic malignancy, staging assessment. * Tracking Code: BO * EXAM: CT CHEST, ABDOMEN, AND PELVIS WITH CONTRAST TECHNIQUE: Multidetector CT imaging of the chest, abdomen and pelvis was performed following the standard protocol during bolus administration of intravenous contrast. RADIATION DOSE REDUCTION: This exam was performed according to the departmental dose-optimization program which includes automated exposure control,  adjustment of the mA and/or kV according to patient size and/or use of iterative reconstruction technique. CONTRAST:  147m OMNIPAQUE IOHEXOL 300 MG/ML  SOLN COMPARISON:  Not available aside from head and neck imaging from January of 2023. FINDINGS: CT CHEST FINDINGS Cardiovascular: Normal appearance of the heart in the aorta.  Segmental pulmonary emboli in the RIGHT lower lobe pulmonary arteries. Small emboli also in the LEFT lower lobe segmental and subsegmental branches posteriorly best seen on images 94 and 104 respectively. No central embolus. No pericardial effusion or nodularity. Mild distortion of mediastinal and cardiac structures due to w pectus excavatum which is moderate. Mediastinum/Nodes: No thoracic inlet, axillary, hilar or mediastinal lymphadenopathy. Esophagus grossly normal. Lungs/Pleura:. Small RIGHT effusion. No consolidation. Airways are patent. Mild scarring in the RIGHT middle lobe. 3 mm pulmonary nodule in the RIGHT lung base (image 79/4) Musculoskeletal: See below for full musculoskeletal details. CT ABDOMEN PELVIS FINDINGS Hepatobiliary: No focal, suspicious hepatic lesion. Mild fissural widening. Posterior Paddock notching and mild caudate enlargement. Suggestion of mild steatosis. Cholelithiasis. Top-normal caliber of the common bile duct at 7 mm. Query added density of the distal common bile duct though this is not clear. No intrahepatic biliary duct distension. Pancreas: Normal, without mass, inflammation or ductal dilatation. Spleen: Spleen measuring 14 cm greatest axial dimension and 12 cm greatest craniocaudal dimension. Adrenals/Urinary Tract: Adrenal glands are unremarkable. Symmetric renal enhancement. No sign of hydronephrosis. No suspicious renal lesion or perinephric stranding. Urinary bladder is grossly unremarkable. Stomach/Bowel: No acute gastrointestinal findings.  Normal appendix. Vascular/Lymphatic: Nonocclusive thrombus in the RIGHT common femoral vein and extending to  the external iliac transition in the RIGHT groin. Mild aortic atherosclerosis both calcified and noncalcified. No signs of adenopathy in the abdomen. No signs of adenopathy in the pelvis. Reproductive: Unremarkable by CT. Other: Small fat containing umbilical hernia. Musculoskeletal: Heterogeneity of marrow spaces about the LEFT and RIGHT proximal femur is patchy no focal destructive abnormalities. Areas of lucency scattered throughout LEFT and RIGHT proximal femur IMPRESSION: 1. Nonocclusive thrombus in the RIGHT common femoral vein and extending to the external iliac transition in the RIGHT groin. Associated segmental and subsegmental emboli in the RIGHT and LEFT chest as discussed. No signs of RIGHT heart strain. Small associated RIGHT-sided. 2. Splenomegaly is mild to moderate. 3. Fissural widening of hepatic fissures raising the question of liver disease. Of neoplasm in the chest, abdomen or in the pelvis. No adenopathy. 4. Dilated common bile duct with question of added density in the common bile duct in this patient with cholelithiasis. Correlate with symptoms and consider MRI/MRCP for further evaluation as warranted. 5. Patchy appearance of bilateral proximal femora with areas of lucency with relative symmetry. Findings may reflect marrow conversion. Would correlate with any known history of lymphoproliferative process or pain and consider dedicated imaging with MRI as warranted. 6. Cholelithiasis. 7. 3 mm RIGHT lower lobe pulmonary nodule. Could consider short interval follow-up if there is known history of neoplasm, alternatively could consider follow-up at 12 months. Aortic Atherosclerosis (ICD10-I70.0). Critical Value/emergent results of DVT and pulmonary embolism were called by telephone at the time of interpretation on 10/14/2021 at 11:20 am to provider Derral Colucci [facility] , who verbally acknowledged these results. Additional findings of biliary duct dilation were related via the PRA system as outlined in the [employer] dash board. Electronically Signed   By: Zetta Bills M.D.   On: 10/14/2021 11:21

## 2021-10-14 NOTE — Assessment & Plan Note (Signed)
CT imaging revealed persistent right lower extremity DVT and signs of PE Thankfully, she is not symptomatic I recommend 1 dose of Lovenox today We will get authorization and arrange for her to take Xarelto I recommend loading dose

## 2021-10-14 NOTE — Progress Notes (Signed)
Given appt time for blood transfusion tomorrow at 1030 7/1 at [facility], reminded to keep blood bracelet on. She verbalized understanding.

## 2021-10-14 NOTE — Telephone Encounter (Signed)
Called and moved appt times up to starting at 2:30 pm. She is agreeable and will arrive 15 mins early.

## 2021-10-14 NOTE — Assessment & Plan Note (Signed)
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 The patient is symptomatic from anemia and the hemoglobin level is critically low.  Some of the side-effects to be expected including risks of transfusion reactions, chills, infection, syndrome of volume overload and risk of hospitalization from various reasons and the patient is willing to proceed and went ahead to sign consent today. We will arrange for 1 unit of blood tomorrow We discussed the role of immunotherapy with rituximab The risk, benefits, side effects were discussed and she is in agreement to proceed I will see her weekly when she returns for treatment next month

## 2021-10-15 ENCOUNTER — Inpatient Hospital Stay: Payer: BC Managed Care – PPO | Attending: Hematology and Oncology

## 2021-10-15 DIAGNOSIS — Z7901 Long term (current) use of anticoagulants: Secondary | ICD-10-CM | POA: Insufficient documentation

## 2021-10-15 DIAGNOSIS — Z5112 Encounter for antineoplastic immunotherapy: Secondary | ICD-10-CM | POA: Insufficient documentation

## 2021-10-15 DIAGNOSIS — D539 Nutritional anemia, unspecified: Secondary | ICD-10-CM | POA: Diagnosis not present

## 2021-10-15 DIAGNOSIS — B1920 Unspecified viral hepatitis C without hepatic coma: Secondary | ICD-10-CM | POA: Diagnosis not present

## 2021-10-15 DIAGNOSIS — Z86718 Personal history of other venous thrombosis and embolism: Secondary | ICD-10-CM | POA: Insufficient documentation

## 2021-10-15 DIAGNOSIS — D5912 Cold autoimmune hemolytic anemia: Secondary | ICD-10-CM | POA: Diagnosis not present

## 2021-10-15 DIAGNOSIS — R031 Nonspecific low blood-pressure reading: Secondary | ICD-10-CM | POA: Diagnosis not present

## 2021-10-15 DIAGNOSIS — G35 Multiple sclerosis: Secondary | ICD-10-CM | POA: Diagnosis not present

## 2021-10-15 DIAGNOSIS — R42 Dizziness and giddiness: Secondary | ICD-10-CM | POA: Insufficient documentation

## 2021-10-15 DIAGNOSIS — I2699 Other pulmonary embolism without acute cor pulmonale: Secondary | ICD-10-CM | POA: Insufficient documentation

## 2021-10-15 DIAGNOSIS — Z79899 Other long term (current) drug therapy: Secondary | ICD-10-CM | POA: Diagnosis not present

## 2021-10-15 DIAGNOSIS — E559 Vitamin D deficiency, unspecified: Secondary | ICD-10-CM | POA: Diagnosis not present

## 2021-10-15 DIAGNOSIS — E538 Deficiency of other specified B group vitamins: Secondary | ICD-10-CM | POA: Insufficient documentation

## 2021-10-15 MED ORDER — SODIUM CHLORIDE 0.9% IV SOLUTION
250.0000 mL | Freq: Once | INTRAVENOUS | Status: AC
  Administered 2021-10-15: 250 mL via INTRAVENOUS

## 2021-10-15 MED ORDER — DIPHENHYDRAMINE HCL 25 MG PO CAPS
25.0000 mg | ORAL_CAPSULE | Freq: Once | ORAL | Status: AC
  Administered 2021-10-15: 25 mg via ORAL
  Filled 2021-10-15: qty 1

## 2021-10-15 MED ORDER — SODIUM CHLORIDE 0.9% FLUSH: 3.0000 mL | INTRAVENOUS | Status: DC | PRN

## 2021-10-15 MED ORDER — ACETAMINOPHEN 325 MG PO TABS
650.0000 mg | ORAL_TABLET | Freq: Once | ORAL | Status: AC
  Administered 2021-10-15: 650 mg via ORAL
  Filled 2021-10-15: qty 2

## 2021-10-15 NOTE — Patient Instructions (Signed)
Blood Transfusion, Adult, Care After ?This sheet gives you information about how to care for yourself after your procedure. Your health care provider may also give you more specific instructions. If you have problems or questions, contact your health care provider. ?What can I expect after the procedure? ?After the procedure, it is common to have: ?Bruising and soreness where the IV was inserted. ?A headache. ?Follow these instructions at home: ?IV insertion site care ? ?  ? ?Follow instructions from your health care provider about how to take care of your IV insertion site. Make sure you: ?Wash your hands with soap and water before and after you change your bandage (dressing). If soap and water are not available, use hand sanitizer. ?Change your dressing as told by your health care provider. ?Check your IV insertion site every day for signs of infection. Check for: ?Redness, swelling, or pain. ?Bleeding from the site. ?Warmth. ?Pus or a bad smell. ?General instructions ?Take over-the-counter and prescription medicines only as told by your health care provider. ?Rest as told by your health care provider. ?Return to your normal activities as told by your health care provider. ?Keep all follow-up visits as told by your health care provider. This is important. ?Contact a health care provider if: ?You have itching or red, swollen areas of skin (hives). ?You feel anxious. ?You feel weak after doing your normal activities. ?You have redness, swelling, warmth, or pain around the IV insertion site. ?You have blood coming from the IV insertion site that does not stop with pressure. ?You have pus or a bad smell coming from your IV insertion site. ?Get help right away if: ?You have symptoms of a serious allergic or immune system reaction, including: ?Trouble breathing or shortness of breath. ?Swelling of the face or feeling flushed. ?Fever or chills. ?Pain in the head, back, or chest. ?Dark urine or blood in the  urine. ?Widespread rash. ?Fast heartbeat. ?Feeling dizzy or light-headed. ?If you receive your blood transfusion in an outpatient setting, you will be told whom to contact to report any reactions. ?These symptoms may represent a serious problem that is an emergency. Do not wait to see if the symptoms will go away. Get medical help right away. Call your local emergency services (911 in the [place] Do not drive yourself to the hospital. ?Summary ?Bruising and tenderness around the IV insertion site are common. ?Check your IV insertion site every day for signs of infection. ?Rest as told by your health care provider. Return to your normal activities as told by your health care provider. ?Get help right away for symptoms of a serious allergic or immune system reaction to blood transfusion. ?This information is not intended to replace advice given to you by your health care provider. Make sure you discuss any questions you have with your health care provider. ?Document Revised: 07/29/2020 Document Reviewed: 09/26/2018 ?Elsevier Patient Education ? 2023 [employer] ? ?

## 2021-10-16 LAB — C3 COMPLEMENT: C3 Complement: 113 mg/dL (ref 82–167)

## 2021-10-16 LAB — C4 COMPLEMENT: Complement C4, Body Fluid: <2 mg/dL — ABNORMAL LOW (ref 12–38)

## 2021-10-17 ENCOUNTER — Encounter: Payer: Self-pay | Admitting: Hematology and Oncology

## 2021-10-17 ENCOUNTER — Telehealth: Payer: Self-pay

## 2021-10-17 ENCOUNTER — Inpatient Hospital Stay: Payer: BC Managed Care – PPO

## 2021-10-17 ENCOUNTER — Encounter: Payer: Self-pay | Admitting: Neurology

## 2021-10-17 ENCOUNTER — Other Ambulatory Visit: Payer: Self-pay

## 2021-10-17 DIAGNOSIS — D5912 Cold autoimmune hemolytic anemia: Secondary | ICD-10-CM | POA: Diagnosis not present

## 2021-10-17 DIAGNOSIS — Z7901 Long term (current) use of anticoagulants: Secondary | ICD-10-CM | POA: Diagnosis not present

## 2021-10-17 DIAGNOSIS — E559 Vitamin D deficiency, unspecified: Secondary | ICD-10-CM | POA: Diagnosis not present

## 2021-10-17 DIAGNOSIS — G35 Multiple sclerosis: Secondary | ICD-10-CM | POA: Diagnosis not present

## 2021-10-17 DIAGNOSIS — R031 Nonspecific low blood-pressure reading: Secondary | ICD-10-CM | POA: Diagnosis not present

## 2021-10-17 DIAGNOSIS — R42 Dizziness and giddiness: Secondary | ICD-10-CM | POA: Diagnosis not present

## 2021-10-17 DIAGNOSIS — Z5112 Encounter for antineoplastic immunotherapy: Secondary | ICD-10-CM | POA: Diagnosis not present

## 2021-10-17 DIAGNOSIS — Z79899 Other long term (current) drug therapy: Secondary | ICD-10-CM | POA: Diagnosis not present

## 2021-10-17 DIAGNOSIS — E538 Deficiency of other specified B group vitamins: Secondary | ICD-10-CM | POA: Diagnosis not present

## 2021-10-17 DIAGNOSIS — D539 Nutritional anemia, unspecified: Secondary | ICD-10-CM | POA: Diagnosis not present

## 2021-10-17 DIAGNOSIS — Z86718 Personal history of other venous thrombosis and embolism: Secondary | ICD-10-CM | POA: Diagnosis not present

## 2021-10-17 DIAGNOSIS — B1920 Unspecified viral hepatitis C without hepatic coma: Secondary | ICD-10-CM | POA: Diagnosis not present

## 2021-10-17 DIAGNOSIS — I2699 Other pulmonary embolism without acute cor pulmonale: Secondary | ICD-10-CM | POA: Diagnosis not present

## 2021-10-17 LAB — TYPE AND SCREEN
ABO/RH(D): B POS
Antibody Screen: NEGATIVE
Unit division: 0

## 2021-10-17 LAB — CBC WITH DIFFERENTIAL (CANCER CENTER ONLY)
Abs Immature Granulocytes: 0.11 10*3/uL — ABNORMAL HIGH (ref 0.00–0.07)
Basophils Absolute: 0.1 10*3/uL (ref 0.0–0.1)
Basophils Relative: 1 %
Eosinophils Absolute: 0.2 10*3/uL (ref 0.0–0.5)
Eosinophils Relative: 2 %
HCT: 25.2 % — ABNORMAL LOW (ref 36.0–46.0)
Hemoglobin: 9.1 g/dL — ABNORMAL LOW (ref 12.0–15.0)
Immature Granulocytes: 1 %
Lymphocytes Relative: 31 %
Lymphs Abs: 2.5 10*3/uL (ref 0.7–4.0)
MCH: 35.8 pg — ABNORMAL HIGH (ref 26.0–34.0)
MCHC: 36.1 g/dL — ABNORMAL HIGH (ref 30.0–36.0)
MCV: 99.2 fL (ref 80.0–100.0)
Monocytes Absolute: 0.9 10*3/uL (ref 0.1–1.0)
Monocytes Relative: 11 %
Neutro Abs: 4.5 10*3/uL (ref 1.7–7.7)
Neutrophils Relative %: 54 %
Platelet Count: 219 10*3/uL (ref 150–400)
RBC: 2.54 MIL/uL — ABNORMAL LOW (ref 3.87–5.11)
RDW: 19.9 % — ABNORMAL HIGH (ref 11.5–15.5)
WBC Count: 8.2 10*3/uL (ref 4.0–10.5)
nRBC: 1.3 % — ABNORMAL HIGH (ref 0.0–0.2)

## 2021-10-17 LAB — BPAM RBC
Blood Product Expiration Date: 202307252359
ISSUE DATE / TIME: 202307011030
Unit Type and Rh: 5100

## 2021-10-17 LAB — SURGICAL PATHOLOGY

## 2021-10-17 NOTE — Progress Notes (Signed)
Told in the lobby that HGB 9.1 and blood transfusion not needed. 2 pm blood transfusion canceled. She verbalized understanding.

## 2021-10-17 NOTE — Telephone Encounter (Signed)
Called regarding mychart message. Offered lab appt at 1 pm today and possible blood at 2 pm. She will wait in lobby until office staff speaks with her.

## 2021-10-19 LAB — FLOW CYTOMETRY

## 2021-10-21 NOTE — Progress Notes (Unsigned)
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 CT imaging showed no evidence of lymphoma She is taking vitamin T-70 and folic acid We discussed the role of immunotherapy with rituximab The risk, benefits, side effects were discussed and she is in agreement to proceed I will see her weekly when she returns for treatment She does not need transfusion support  Low blood pressure Her blood pressure is low and she felt dizzy We discussed importance of adequate hydration We will proceed with treatment without delay  Pulmonary embolus ([place]) She is doing well so far with anticoagulation therapy without bleeding complications She will continue for minimum 1 year   No orders of the defined types were placed in this encounter.   The total time spent in the appointment was 20 minutes encounter with patients including review of chart and various tests results, discussions about plan of care and coordination of care plan   All questions were answered. The patient knows to call the clinic with any problems, questions or concerns. No barriers to learning was detected.    Heath Lark, MD 7/10/202310:31 AM  INTERVAL HISTORY: [name] 46 y.o. female returns for follow-up and start of treatment She complained of dizziness but no falls No bleeding complications from anticoagulation therapy  SUMMARY OF HEMATOLOGIC HISTORY: [name] 46 y.o. 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  She also have history of DVT.  The patient currently uses tobacco products with vaping on a regular basis.  She has recent weakness and dizziness.  She had extensive work-up in January and recently.  She was found to have elevated homocystine level, borderline B12 deficiency, folate deficiency and vitamin D deficiency.  She has not been taking these vitamin replacement products  She was found to have abnormal CBC from recent blood work Her most recent CBC showed hemoglobin of 7.9 Reticulocyte count is inappropriately low for the degree of anemia She was also noticing passage of dark urine She denies recent chest pain on exertion, shortness of breath on minimal exertion, pre-syncopal episodes, or palpitations. She had not noticed any recent bleeding such as epistaxis, hematuria or hematochezia The patient denies over the counter NSAID ingestion. She is not on antiplatelets agents.  She had no prior history or diagnosis of cancer. Her age appropriate screening programs are up-to-date. She denies any pica and eats a variety of diet. She never donated blood or received blood transfusion CT imaging on October 14, 2021 showed evidence of DVT and PE, mild splenomegaly but no evidence of lymphoma   I have reviewed the past medical history, past surgical history, social history and family history with the patient and they are unchanged from previous note.  ALLERGIES:  has No Known Allergies.  MEDICATIONS:  Current Outpatient Medications  Medication Sig Dispense Refill   ALPRAZolam (XANAX) 1 MG tablet TAKE 1 TABLET BY MOUTH 3 TIMES DAILY AS NEEDED FOR ANXIETY. 90 tablet 2   busPIRone (BUSPAR) 30 MG tablet TAKE 1 TABLET BY MOUTH  2 TIMES DAILY. 180 tablet 0   cholecalciferol (VITAMIN D3) 25 MCG (1000 UNIT) tablet Take 2,000 Units by mouth daily.     folic acid (FOLVITE) 1 MG tablet Take 1 mg by mouth daily.     lamoTRIgine (LAMICTAL) 150 MG tablet Take 1 tablet (150 mg total) by  mouth 2 (two) times daily. 60 tablet 5   risperidone (RISPERDAL) 4 MG tablet TAKE 1 TABLET DAILY 90 tablet 3   RIVAROXABAN (XARELTO) VTE STARTER PACK (15 & 20 MG) Follow package directions: Take one '15mg'$  tablet by mouth twice a day. On day 22, switch to one '20mg'$  tablet once a day. Take with food. 51 each 0   vitamin B-12 (CYANOCOBALAMIN) 1000 MCG tablet Take 1,000 mcg by mouth daily.     No current facility-administered medications for this visit.   Facility-Administered Medications Ordered in Other Visits  Medication Dose Route Frequency Provider Last Rate Last Admin   0.9 %  sodium chloride infusion   Intravenous Once Alvy Bimler, Raelea Gosse, MD       riTUXimab-pvvr (RUXIENCE) 800 mg in sodium chloride 0.9 % 250 mL (2.4242 mg/mL) infusion  375 mg/m2 (Treatment Plan Recorded) Intravenous Once Heath Lark, MD         REVIEW OF SYSTEMS:   Constitutional: Denies fevers, chills or night sweats Eyes: Denies blurriness of vision Ears, nose, mouth, throat, and face: Denies mucositis or sore throat Respiratory: Denies cough, dyspnea or wheezes Cardiovascular: Denies palpitation, chest discomfort or lower extremity swelling Gastrointestinal:  Denies nausea, heartburn or change in bowel habits Skin: Denies abnormal skin rashes Lymphatics: Denies new lymphadenopathy or easy bruising Neurological:Denies numbness, tingling or new weaknesses Behavioral/Psych: Mood is stable, no new changes  All other systems were reviewed with the patient and are negative.  PHYSICAL EXAMINATION: ECOG PERFORMANCE STATUS: 1 - Symptomatic but completely ambulatory  Vitals:   10/24/21 0906  BP: (!) 99/58  Pulse: 84  Resp: 18  Temp: (!) 97.4 F (36.3 C)  SpO2: 97%   Filed Weights   10/24/21 0906  Weight: 216 lb 6.4 oz (98.2 kg)    GENERAL:alert, no distress and comfortable NEURO: alert & oriented x 3 with fluent speech, no focal motor/sensory deficits  LABORATORY DATA:  I have reviewed the data as listed      Component Value Date/Time   NA 138 10/24/2021 0838   K 3.9 10/24/2021 0838   CL 108 10/24/2021 0838   CO2 24 10/24/2021 0838   GLUCOSE 99 10/24/2021 0838   BUN 12 10/24/2021 0838   CREATININE 0.89 10/24/2021 0838   CALCIUM 10.6 (H) 10/24/2021 0838   CALCIUM 10.4 (H) 04/22/2021 0119   PROT 6.8 10/24/2021 0838   ALBUMIN 4.2 10/24/2021 0838   AST 16 10/24/2021 0838   ALT 17 10/24/2021 0838   ALKPHOS 83 10/24/2021 0838   BILITOT 2.3 (H) 10/24/2021 0838   GFRNONAA >60 10/24/2021 0838   GFRAA >90 12/27/2013 1827    No results found for: "SPEP", "UPEP"  Lab Results  Component Value Date   WBC 8.0 10/24/2021   NEUTROABS 4.4 10/24/2021   HGB 8.8 (L) 10/24/2021   HCT 24.4 (L) 10/24/2021   MCV 100.8 (H) 10/24/2021   PLT 253 10/24/2021      Chemistry      Component Value Date/Time   NA 138 10/24/2021 0838   K 3.9 10/24/2021 0838   CL 108 10/24/2021 0838   CO2 24 10/24/2021 0838   BUN 12 10/24/2021 0838   CREATININE 0.89  10/24/2021 0838      Component Value Date/Time   CALCIUM 10.6 (H) 10/24/2021 0838   CALCIUM 10.4 (H) 04/22/2021 0119   ALKPHOS 83 10/24/2021 0838   AST 16 10/24/2021 0838   ALT 17 10/24/2021 0838   BILITOT 2.3 (H) 10/24/2021 7001

## 2021-10-21 NOTE — Progress Notes (Signed)
Pharmacist Chemotherapy Monitoring - Initial Assessment    Anticipated start date: 10/24/21   The following has been reviewed per standard work regarding the patient's treatment regimen: The patient's diagnosis, treatment plan and drug doses, and organ/hematologic function Lab orders and baseline tests specific to treatment regimen  The treatment plan start date, drug sequencing, and pre-medications Prior authorization status  Patient's documented medication list, including drug-drug interaction screen and prescriptions for anti-emetics and supportive care specific to the treatment regimen The drug concentrations, fluid compatibility, administration routes, and timing of the medications to be used The patient's access for treatment and lifetime cumulative dose history, if applicable  The patient's medication allergies and previous infusion related reactions, if applicable   Changes made to treatment plan:  treatment plan date  Follow up needed:  Will f/u if pt candidate for rapid Ruxience (w/ C2).   Kennith Center, Pharm.D., CPP 10/21/2021'@4'$ :17 PM

## 2021-10-21 NOTE — Assessment & Plan Note (Signed)
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

## 2021-10-24 ENCOUNTER — Other Ambulatory Visit: Payer: Self-pay

## 2021-10-24 ENCOUNTER — Inpatient Hospital Stay: Payer: BC Managed Care – PPO | Attending: Physician Assistant | Admitting: Physician Assistant

## 2021-10-24 ENCOUNTER — Inpatient Hospital Stay (HOSPITAL_BASED_OUTPATIENT_CLINIC_OR_DEPARTMENT_OTHER): Payer: BC Managed Care – PPO | Admitting: Hematology and Oncology

## 2021-10-24 ENCOUNTER — Encounter: Payer: Self-pay | Admitting: Hematology and Oncology

## 2021-10-24 ENCOUNTER — Other Ambulatory Visit: Payer: Self-pay | Admitting: Hematology and Oncology

## 2021-10-24 ENCOUNTER — Inpatient Hospital Stay: Payer: BC Managed Care – PPO

## 2021-10-24 VITALS — BP 98/58 | HR 86 | Temp 98.4°F | Resp 16

## 2021-10-24 DIAGNOSIS — E559 Vitamin D deficiency, unspecified: Secondary | ICD-10-CM | POA: Diagnosis not present

## 2021-10-24 DIAGNOSIS — R031 Nonspecific low blood-pressure reading: Secondary | ICD-10-CM | POA: Diagnosis not present

## 2021-10-24 DIAGNOSIS — I2699 Other pulmonary embolism without acute cor pulmonale: Secondary | ICD-10-CM

## 2021-10-24 DIAGNOSIS — Z79899 Other long term (current) drug therapy: Secondary | ICD-10-CM | POA: Diagnosis not present

## 2021-10-24 DIAGNOSIS — D5912 Cold autoimmune hemolytic anemia: Secondary | ICD-10-CM

## 2021-10-24 DIAGNOSIS — R42 Dizziness and giddiness: Secondary | ICD-10-CM | POA: Diagnosis not present

## 2021-10-24 DIAGNOSIS — R6889 Other general symptoms and signs: Secondary | ICD-10-CM

## 2021-10-24 DIAGNOSIS — E538 Deficiency of other specified B group vitamins: Secondary | ICD-10-CM | POA: Diagnosis not present

## 2021-10-24 DIAGNOSIS — G35 Multiple sclerosis: Secondary | ICD-10-CM | POA: Diagnosis not present

## 2021-10-24 DIAGNOSIS — I950 Idiopathic hypotension: Secondary | ICD-10-CM

## 2021-10-24 DIAGNOSIS — Z86718 Personal history of other venous thrombosis and embolism: Secondary | ICD-10-CM | POA: Diagnosis not present

## 2021-10-24 DIAGNOSIS — B1920 Unspecified viral hepatitis C without hepatic coma: Secondary | ICD-10-CM | POA: Diagnosis not present

## 2021-10-24 DIAGNOSIS — Z7901 Long term (current) use of anticoagulants: Secondary | ICD-10-CM | POA: Diagnosis not present

## 2021-10-24 DIAGNOSIS — D539 Nutritional anemia, unspecified: Secondary | ICD-10-CM | POA: Diagnosis not present

## 2021-10-24 DIAGNOSIS — Z5112 Encounter for antineoplastic immunotherapy: Secondary | ICD-10-CM | POA: Diagnosis not present

## 2021-10-24 DIAGNOSIS — D598 Other acquired hemolytic anemias: Secondary | ICD-10-CM

## 2021-10-24 DIAGNOSIS — I959 Hypotension, unspecified: Secondary | ICD-10-CM | POA: Insufficient documentation

## 2021-10-24 LAB — CBC WITH DIFFERENTIAL (CANCER CENTER ONLY)
Abs Immature Granulocytes: 0.08 10*3/uL — ABNORMAL HIGH (ref 0.00–0.07)
Basophils Absolute: 0.1 10*3/uL (ref 0.0–0.1)
Basophils Relative: 1 %
Eosinophils Absolute: 0.2 10*3/uL (ref 0.0–0.5)
Eosinophils Relative: 2 %
HCT: 24.4 % — ABNORMAL LOW (ref 36.0–46.0)
Hemoglobin: 8.8 g/dL — ABNORMAL LOW (ref 12.0–15.0)
Immature Granulocytes: 1 %
Lymphocytes Relative: 29 %
Lymphs Abs: 2.3 10*3/uL (ref 0.7–4.0)
MCH: 36.4 pg — ABNORMAL HIGH (ref 26.0–34.0)
MCHC: 36.1 g/dL — ABNORMAL HIGH (ref 30.0–36.0)
MCV: 100.8 fL — ABNORMAL HIGH (ref 80.0–100.0)
Monocytes Absolute: 0.9 10*3/uL (ref 0.1–1.0)
Monocytes Relative: 11 %
Neutro Abs: 4.4 10*3/uL (ref 1.7–7.7)
Neutrophils Relative %: 56 %
Platelet Count: 253 10*3/uL (ref 150–400)
RBC: 2.42 MIL/uL — ABNORMAL LOW (ref 3.87–5.11)
RDW: 18.9 % — ABNORMAL HIGH (ref 11.5–15.5)
WBC Count: 8 10*3/uL (ref 4.0–10.5)
nRBC: 1.5 % — ABNORMAL HIGH (ref 0.0–0.2)

## 2021-10-24 LAB — CMP (CANCER CENTER ONLY)
ALT: 17 U/L (ref 0–44)
AST: 16 U/L (ref 15–41)
Albumin: 4.2 g/dL (ref 3.5–5.0)
Alkaline Phosphatase: 83 U/L (ref 38–126)
Anion gap: 6 (ref 5–15)
BUN: 12 mg/dL (ref 6–20)
CO2: 24 mmol/L (ref 22–32)
Calcium: 10.6 mg/dL — ABNORMAL HIGH (ref 8.9–10.3)
Chloride: 108 mmol/L (ref 98–111)
Creatinine: 0.89 mg/dL (ref 0.44–1.00)
GFR, Estimated: >60 mL/min (ref >60)
Glucose, Bld: 99 mg/dL (ref 70–99)
Potassium: 3.9 mmol/L (ref 3.5–5.1)
Sodium: 138 mmol/L (ref 135–145)
Total Bilirubin: 2.3 mg/dL — ABNORMAL HIGH (ref 0.3–1.2)
Total Protein: 6.8 g/dL (ref 6.5–8.1)

## 2021-10-24 LAB — SAMPLE TO BLOOD BANK

## 2021-10-24 MED ORDER — SODIUM CHLORIDE 0.9 % IV SOLN: Freq: Once | INTRAVENOUS | Status: AC

## 2021-10-24 MED ORDER — METHYLPREDNISOLONE SODIUM SUCC 125 MG IJ SOLR
125.0000 mg | Freq: Once | INTRAMUSCULAR | Status: AC | PRN
  Administered 2021-10-24: 125 mg via INTRAVENOUS

## 2021-10-24 MED ORDER — SODIUM CHLORIDE 0.9 % IV SOLN
375.0000 mg/m2 | Freq: Once | INTRAVENOUS | Status: AC
  Administered 2021-10-24: 800 mg via INTRAVENOUS
  Filled 2021-10-24: qty 30

## 2021-10-24 MED ORDER — ACETAMINOPHEN 325 MG PO TABS
650.0000 mg | ORAL_TABLET | Freq: Once | ORAL | Status: AC
  Administered 2021-10-24: 650 mg via ORAL
  Filled 2021-10-24: qty 2

## 2021-10-24 MED ORDER — DIPHENHYDRAMINE HCL 25 MG PO CAPS
50.0000 mg | ORAL_CAPSULE | Freq: Once | ORAL | Status: AC
  Administered 2021-10-24: 50 mg via ORAL
  Filled 2021-10-24: qty 2

## 2021-10-24 MED ORDER — FAMOTIDINE IN NACL 20-0.9 MG/50ML-% IV SOLN
20.0000 mg | Freq: Once | INTRAVENOUS | Status: AC | PRN
  Administered 2021-10-24: 20 mg via INTRAVENOUS

## 2021-10-24 MED ORDER — MORPHINE SULFATE (PF) 2 MG/ML IV SOLN
2.0000 mg | Freq: Once | INTRAVENOUS | Status: AC
  Administered 2021-10-24: 2 mg via INTRAVENOUS
  Filled 2021-10-24: qty 1

## 2021-10-24 MED ORDER — SODIUM CHLORIDE 0.9 % IV SOLN: Freq: Once | INTRAVENOUS | Status: DC | PRN

## 2021-10-24 NOTE — Assessment & Plan Note (Signed)
She is doing well so far with anticoagulation therapy without bleeding complications She will continue for minimum 1 year

## 2021-10-24 NOTE — Assessment & Plan Note (Signed)
Her blood pressure is low and she felt dizzy We discussed importance of adequate hydration We will proceed with treatment without delay

## 2021-10-24 NOTE — Progress Notes (Signed)
DATE:  10/24/21                                        X CHEMO/IMMUNOTHERAPY REACTION           MD: Alvy Bimler   AGENT/BLOOD PRODUCT RECEIVING TODAY:              Rituxan   AGENT/BLOOD PRODUCT RECEIVING IMMEDIATELY PRIOR TO REACTION:          Rituxan   VS: BP:     103/67   P:       91       SPO2:       96% room air  T: 97.6                BP:     95/51   P:       86       SPO2:       99%     REACTION(S):           Rigors and back pain   PREMEDS:     Benadryl 50 mg PO, Tylenol 650 mg PO   INTERVENTION: Pepcid 20 mg IV, Solumedrol 125 mg IV, Morphine 2 mg IV   Review of Systems  Review of Systems  Constitutional:  Positive for chills.  Respiratory:  Negative for cough, chest tightness, shortness of breath and wheezing.   Cardiovascular:  Negative for chest pain and palpitations.  Gastrointestinal:  Negative for abdominal pain and nausea.  Musculoskeletal:  Positive for back pain. Negative for arthralgias, myalgias and neck pain.  Skin:  Negative for color change and rash.     Physical Exam  Physical Exam Vitals and nursing note reviewed.  Constitutional:      Appearance: She is well-developed. She is not ill-appearing or toxic-appearing.     Comments: rigors  HENT:     Head: Normocephalic and atraumatic.     Nose: Nose normal.  Eyes:     General: No scleral icterus.       Right eye: No discharge.        Left eye: No discharge.     Conjunctiva/sclera: Conjunctivae normal.  Neck:     Vascular: No JVD.  Cardiovascular:     Rate and Rhythm: Normal rate and regular rhythm.     Pulses: Normal pulses.     Heart sounds: Normal heart sounds.  Pulmonary:     Effort: Pulmonary effort is normal.     Breath sounds: Normal breath sounds.  Abdominal:     General: There is no distension.  Musculoskeletal:        General: Normal range of motion.     Cervical back: Normal range of motion.     Right lower leg: No edema.     Left lower leg: No edema.     Comments:  No  cervical, thoracic, or lumbar spinal tenderness to palpation.  No paraspinal tenderness. No step offs, crepitus or deformity palpated.    Skin:    General: Skin is warm and dry.  Neurological:     Mental Status: She is oriented to person, place, and time.     GCS: GCS eye subscore is 4. GCS verbal subscore is 5. GCS motor subscore is 6.     Comments: Fluent speech, no facial droop.  Psychiatric:        Behavior:  Behavior normal.     OUTCOME:                Patient treated with emergency medications as above. She returned to baseline. Infusion was restarted at previous rate and she tolerated remainder of treatment without further reactions. Patient evaluated by Dr. Alvy Bimler as well who plans to order additional pre medications to future treatments.

## 2021-10-24 NOTE — Progress Notes (Signed)
Hypersensitivity Reaction note  Date of event: 10/24/21 Time of event: 1220 Generic name of drug involved: rituximab Name of provider notified of the hypersensitivity reaction: Eloise Harman PA-C/Dr Alvy Bimler Was agent that likely caused hypersensitivity reaction added to Allergies List within EMR? yes Chain of events including reaction signs/symptoms, treatment administered, and outcome (e.g., drug resumed; drug discontinued; sent to Emergency Department; etc.) Patient began to complain of back pain and chills at 1220. Medication paused. Eloise Harman PA notified. Orders given. See MAR for medications given and flowsheet for vitals. After medications administered, patient returned to baseline around 12:55. Per Dr Alvy Bimler, rituximab restarted at prior rate and will titrate as usual. Patient able to complete infusion.  Clyda Hurdle, RN 10/24/2021 1:44 PM

## 2021-10-24 NOTE — Patient Instructions (Signed)
[facility]   Discharge Instructions: Thank you for choosing [facility] to provide your oncology and hematology care.   If you have a lab appointment with the [facility], please go directly to the [facility] and check in at the registration area.   Wear comfortable clothing and clothing appropriate for easy access to any Portacath or PICC line.   We strive to give you quality time with your provider. You may need to reschedule your appointment if you arrive late (15 or more minutes).  Arriving late affects you and other patients whose appointments are after yours.  Also, if you miss three or more appointments without notifying the office, you may be dismissed from the clinic at the provider's discretion.      For prescription refill requests, have your pharmacy contact our office and allow 72 hours for refills to be completed.    Today you received the following chemotherapy and/or immunotherapy agents: rituximab-pvvr      To help prevent nausea and vomiting after your treatment, we encourage you to take your nausea medication as directed.  BELOW ARE SYMPTOMS THAT SHOULD BE REPORTED IMMEDIATELY: *FEVER GREATER THAN 100.4 F (38 C) OR HIGHER *CHILLS OR SWEATING *NAUSEA AND VOMITING THAT IS NOT CONTROLLED WITH YOUR NAUSEA MEDICATION *UNUSUAL SHORTNESS OF BREATH *UNUSUAL BRUISING OR BLEEDING *URINARY PROBLEMS (pain or burning when urinating, or frequent urination) *BOWEL PROBLEMS (unusual diarrhea, constipation, pain near the anus) TENDERNESS IN MOUTH AND THROAT WITH OR WITHOUT PRESENCE OF ULCERS (sore throat, sores in mouth, or a toothache) UNUSUAL RASH, SWELLING OR PAIN  UNUSUAL VAGINAL DISCHARGE OR ITCHING   Items with * indicate a potential emergency and should be followed up as soon as possible or go to the Emergency Department if any problems should occur.  Please show the CHEMOTHERAPY ALERT CARD or IMMUNOTHERAPY ALERT CARD at  check-in to the Emergency Department and triage nurse.  Should you have questions after your visit or need to cancel or reschedule your appointment, please contact [facility]  Dept: [phone]  and follow the prompts.  Office hours are 8:00 a.m. to 4:30 p.m. Monday - Friday. Please note that voicemails left after 4:00 p.m. may not be returned until the following business day.  We are closed weekends and major holidays. You have access to a nurse at all times for urgent questions. Please call the main number to the clinic Dept: [phone] and follow the prompts.   For any non-urgent questions, you may also contact your provider using MyChart. We now offer e-Visits for anyone 55 and older to request care online for non-urgent symptoms. For details visit [email or web address].   Also download the MyChart app! Go to the app store, search "MyChart", open the app, select [facility], and log in with your MyChart username and password.  Masks are optional in the cancer centers. If you would like for your care team to wear a mask while they are taking care of you, please let them know. For doctor visits, patients may have with them one support person who is at least 47 years old. At this time, visitors are not allowed in the infusion area.

## 2021-10-25 ENCOUNTER — Telehealth: Payer: Self-pay

## 2021-10-25 ENCOUNTER — Telehealth: Payer: Self-pay | Admitting: *Deleted

## 2021-10-25 NOTE — Telephone Encounter (Signed)
-----   Message from Clyda Hurdle, RN sent at 10/24/2021  3:55 PM EDT ----- Regarding: 1st Time Call Back- Dr Alvy Bimler 1st Time call back.  Dr Calton Dach patient. Patient had hypersensitivity reaction but was able to complete infusion.

## 2021-10-25 NOTE — Telephone Encounter (Signed)
Called pt & she is at work & states that she is doing fine & denies any problems.  She asked how long it would take for her to feel better.  Informed that this nurse was not sure but if she is worse to call us & to discuss with Dr Alvy Bimler at next visit.  She expressed understanding.  Discussed getting rest, exercise, & eating well.

## 2021-10-25 NOTE — Telephone Encounter (Signed)
LM for patient that this nurse was calling to see how they were doing after their treatment. Please call back to Dr. Gorsuch's nurse at [phone] if they have any questions or concerns regarding the treatment. 

## 2021-10-31 ENCOUNTER — Other Ambulatory Visit: Payer: Self-pay

## 2021-10-31 ENCOUNTER — Inpatient Hospital Stay: Payer: BC Managed Care – PPO

## 2021-10-31 ENCOUNTER — Inpatient Hospital Stay (HOSPITAL_BASED_OUTPATIENT_CLINIC_OR_DEPARTMENT_OTHER): Payer: BC Managed Care – PPO | Admitting: Hematology and Oncology

## 2021-10-31 ENCOUNTER — Encounter: Payer: Self-pay | Admitting: Hematology and Oncology

## 2021-10-31 VITALS — BP 109/69 | HR 67 | Temp 97.9°F | Resp 16

## 2021-10-31 DIAGNOSIS — E538 Deficiency of other specified B group vitamins: Secondary | ICD-10-CM | POA: Diagnosis not present

## 2021-10-31 DIAGNOSIS — D5912 Cold autoimmune hemolytic anemia: Secondary | ICD-10-CM

## 2021-10-31 DIAGNOSIS — Z5112 Encounter for antineoplastic immunotherapy: Secondary | ICD-10-CM | POA: Diagnosis not present

## 2021-10-31 DIAGNOSIS — Z86718 Personal history of other venous thrombosis and embolism: Secondary | ICD-10-CM | POA: Diagnosis not present

## 2021-10-31 DIAGNOSIS — R42 Dizziness and giddiness: Secondary | ICD-10-CM | POA: Diagnosis not present

## 2021-10-31 DIAGNOSIS — Z7901 Long term (current) use of anticoagulants: Secondary | ICD-10-CM | POA: Diagnosis not present

## 2021-10-31 DIAGNOSIS — I2699 Other pulmonary embolism without acute cor pulmonale: Secondary | ICD-10-CM

## 2021-10-31 DIAGNOSIS — D539 Nutritional anemia, unspecified: Secondary | ICD-10-CM | POA: Diagnosis not present

## 2021-10-31 DIAGNOSIS — G35 Multiple sclerosis: Secondary | ICD-10-CM | POA: Diagnosis not present

## 2021-10-31 DIAGNOSIS — D598 Other acquired hemolytic anemias: Secondary | ICD-10-CM

## 2021-10-31 DIAGNOSIS — R031 Nonspecific low blood-pressure reading: Secondary | ICD-10-CM | POA: Diagnosis not present

## 2021-10-31 DIAGNOSIS — B1920 Unspecified viral hepatitis C without hepatic coma: Secondary | ICD-10-CM | POA: Diagnosis not present

## 2021-10-31 DIAGNOSIS — E559 Vitamin D deficiency, unspecified: Secondary | ICD-10-CM | POA: Diagnosis not present

## 2021-10-31 DIAGNOSIS — Z79899 Other long term (current) drug therapy: Secondary | ICD-10-CM | POA: Diagnosis not present

## 2021-10-31 LAB — CBC WITH DIFFERENTIAL (CANCER CENTER ONLY)
Abs Immature Granulocytes: 0.09 10*3/uL — ABNORMAL HIGH (ref 0.00–0.07)
Basophils Absolute: 0.1 10*3/uL (ref 0.0–0.1)
Basophils Relative: 1 %
Eosinophils Absolute: 0.2 10*3/uL (ref 0.0–0.5)
Eosinophils Relative: 3 %
HCT: 23.7 % — ABNORMAL LOW (ref 36.0–46.0)
Hemoglobin: 8.7 g/dL — ABNORMAL LOW (ref 12.0–15.0)
Immature Granulocytes: 2 %
Lymphocytes Relative: 19 %
Lymphs Abs: 1.1 10*3/uL (ref 0.7–4.0)
MCH: 37.8 pg — ABNORMAL HIGH (ref 26.0–34.0)
MCHC: 36.7 g/dL — ABNORMAL HIGH (ref 30.0–36.0)
MCV: 103 fL — ABNORMAL HIGH (ref 80.0–100.0)
Monocytes Absolute: 0.6 10*3/uL (ref 0.1–1.0)
Monocytes Relative: 9 %
Neutro Abs: 4 10*3/uL (ref 1.7–7.7)
Neutrophils Relative %: 66 %
Platelet Count: 257 10*3/uL (ref 150–400)
RBC: 2.3 MIL/uL — ABNORMAL LOW (ref 3.87–5.11)
RDW: 19 % — ABNORMAL HIGH (ref 11.5–15.5)
WBC Count: 6 10*3/uL (ref 4.0–10.5)
nRBC: 2 % — ABNORMAL HIGH (ref 0.0–0.2)

## 2021-10-31 LAB — CMP (CANCER CENTER ONLY)
ALT: 25 U/L (ref 0–44)
AST: 22 U/L (ref 15–41)
Albumin: 4.3 g/dL (ref 3.5–5.0)
Alkaline Phosphatase: 85 U/L (ref 38–126)
Anion gap: 4 — ABNORMAL LOW (ref 5–15)
BUN: 12 mg/dL (ref 6–20)
CO2: 25 mmol/L (ref 22–32)
Calcium: 10.5 mg/dL — ABNORMAL HIGH (ref 8.9–10.3)
Chloride: 110 mmol/L (ref 98–111)
Creatinine: 0.91 mg/dL (ref 0.44–1.00)
GFR, Estimated: >60 mL/min (ref >60)
Glucose, Bld: 98 mg/dL (ref 70–99)
Potassium: 4.4 mmol/L (ref 3.5–5.1)
Sodium: 139 mmol/L (ref 135–145)
Total Bilirubin: 2.2 mg/dL — ABNORMAL HIGH (ref 0.3–1.2)
Total Protein: 6.8 g/dL (ref 6.5–8.1)

## 2021-10-31 LAB — SAMPLE TO BLOOD BANK

## 2021-10-31 MED ORDER — SODIUM CHLORIDE 0.9 % IV SOLN
375.0000 mg/m2 | Freq: Once | INTRAVENOUS | Status: AC
  Administered 2021-10-31: 800 mg via INTRAVENOUS
  Filled 2021-10-31: qty 50

## 2021-10-31 MED ORDER — SODIUM CHLORIDE 0.9 % IV SOLN: Freq: Once | INTRAVENOUS | Status: AC

## 2021-10-31 MED ORDER — METHYLPREDNISOLONE SODIUM SUCC 125 MG IJ SOLR
125.0000 mg | Freq: Once | INTRAMUSCULAR | Status: AC
  Administered 2021-10-31: 125 mg via INTRAVENOUS
  Filled 2021-10-31: qty 2

## 2021-10-31 MED ORDER — FAMOTIDINE IN NACL 20-0.9 MG/50ML-% IV SOLN
20.0000 mg | Freq: Once | INTRAVENOUS | Status: AC
  Administered 2021-10-31: 20 mg via INTRAVENOUS
  Filled 2021-10-31: qty 50

## 2021-10-31 MED ORDER — DIPHENHYDRAMINE HCL 25 MG PO CAPS
50.0000 mg | ORAL_CAPSULE | Freq: Once | ORAL | Status: AC
  Administered 2021-10-31: 50 mg via ORAL
  Filled 2021-10-31: qty 2

## 2021-10-31 MED ORDER — ACETAMINOPHEN 325 MG PO TABS
650.0000 mg | ORAL_TABLET | Freq: Once | ORAL | Status: AC
  Administered 2021-10-31: 650 mg via ORAL
  Filled 2021-10-31: qty 2

## 2021-10-31 NOTE — Assessment & Plan Note (Signed)
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

## 2021-10-31 NOTE — Assessment & Plan Note (Signed)
I reminded her the importance of vitamin J68 and folic acid supplement She does not need transfusion today

## 2021-10-31 NOTE — Patient Instructions (Signed)
[facility]COL[facility]  Discharge Instructions: Thank you for choosing [facility] to provide your oncology and hematology care.   If you have a lab appointment with the [facility], please go directly to the [facility] and check in at the registration area.   Wear comfortable clothing and clothing appropriate for easy access to any Portacath or PICC line.   We strive to give you quality time with your provider. You may need to reschedule your appointment if you arrive late (15 or more minutes).  Arriving late affects you and other patients whose appointments are after yours.  Also, if you miss three or more appointments without notifying the office, you may be dismissed from the clinic at the provider's discretion.      For prescription refill requests, have your pharmacy contact our office and allow 72 hours for refills to be completed.    Today you received the following chemotherapy and/or immunotherapy agents: Ruxience      To help prevent nausea and vomiting after your treatment, we encourage you to take your nausea medication as directed.  BELOW ARE SYMPTOMS THAT SHOULD BE REPORTED IMMEDIATELY: *FEVER GREATER THAN 100.4 F (38 C) OR HIGHER *CHILLS OR SWEATING *NAUSEA AND VOMITING THAT IS NOT CONTROLLED WITH YOUR NAUSEA MEDICATION *UNUSUAL SHORTNESS OF BREATH *UNUSUAL BRUISING OR BLEEDING *URINARY PROBLEMS (pain or burning when urinating, or frequent urination) *BOWEL PROBLEMS (unusual diarrhea, constipation, pain near the anus) TENDERNESS IN MOUTH AND THROAT WITH OR WITHOUT PRESENCE OF ULCERS (sore throat, sores in mouth, or a toothache) UNUSUAL RASH, SWELLING OR PAIN  UNUSUAL VAGINAL DISCHARGE OR ITCHING   Items with * indicate a potential emergency and should be followed up as soon as possible or go to the Emergency Department if any problems should occur.  Please show the CHEMOTHERAPY ALERT CARD or IMMUNOTHERAPY ALERT CARD at check-in to  the Emergency Department and triage nurse.  Should you have questions after your visit or need to cancel or reschedule your appointment, please contact [facility]  Dept: [phone]  and follow the prompts.  Office hours are 8:00 a.m. to 4:30 p.m. Monday - Friday. Please note that voicemails left after 4:00 p.m. may not be returned until the following business day.  We are closed weekends and major holidays. You have access to a nurse at all times for urgent questions. Please call the main number to the clinic Dept: [phone] and follow the prompts.   For any non-urgent questions, you may also contact your provider using MyChart. We now offer e-Visits for anyone 18 and older to request care online for non-urgent symptoms. For details visit [email or web address].   Also download the MyChart app! Go to the app store, search "MyChart", open the app, select [facility], and log in with your MyChart username and password.  Masks are optional in the cancer centers. If you would like for your care team to wear a mask while they are taking care of you, please let them know. For doctor visits, patients may have with them one support person who is at least 47 years old. At this time, visitors are not allowed in the infusion area.

## 2021-10-31 NOTE — Assessment & Plan Note (Signed)
She is doing well so far with anticoagulation therapy without bleeding complications She will continue for minimum 1 year

## 2021-10-31 NOTE — Progress Notes (Signed)
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) She is doing well so far with anticoagulation therapy without bleeding complications She will continue for minimum 1 year  No orders of the defined types were placed in this encounter.   The total time spent in the appointment was 20 minutes encounter with patients including review of chart and various tests results, discussions about plan of care and coordination of care plan   All questions were answered. The patient knows to call the clinic with any problems, questions or concerns. No barriers to learning was detected.    Judith Lark, MD 7/17/202310:37 AM  INTERVAL HISTORY: [name] 46 y.o. [name] returns for follow-up She is seen prior to cycle 2 of rituximab She had reaction after cycle 1 but that resolved after discharge Her energy level is fair She is taking all her medications as prescribed The patient denies any recent signs or symptoms of bleeding such as spontaneous epistaxis, hematuria or hematochezia.  SUMMARY OF HEMATOLOGIC HISTORY: [name] 46 y.o. 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  She also have history of DVT.  The patient currently uses tobacco products with vaping on a regular basis.  She has recent weakness and dizziness.  She had extensive work-up in January and recently.  She was found to have elevated homocystine level, borderline B12 deficiency, folate deficiency and vitamin D deficiency.  She has not been taking these vitamin replacement products  She was found to have abnormal CBC from recent blood work Her most recent CBC showed hemoglobin of 7.9 Reticulocyte count is inappropriately low for the degree of anemia She was also noticing passage of dark urine She denies recent chest pain on exertion, shortness of breath on minimal exertion, pre-syncopal episodes, or palpitations. She had not noticed any recent bleeding such as epistaxis, hematuria or hematochezia The patient denies over the counter NSAID ingestion. She is not on antiplatelets agents.  She had no prior history or diagnosis of cancer. Her age appropriate screening programs are up-to-date. She denies any pica and eats a variety of diet. She never donated blood or received blood transfusion CT imaging on October 14, 2021 showed evidence of DVT and PE, mild splenomegaly but no evidence of lymphoma On July 10, she was started on rituximab   I have reviewed the past medical history, past surgical history, social history and family history with the patient and they are unchanged from previous note.  ALLERGIES:  is allergic to rituximab.  MEDICATIONS:  Current Outpatient Medications  Medication Sig Dispense Refill  ALPRAZolam (XANAX) 1 MG tablet TAKE 1 TABLET BY MOUTH 3 TIMES DAILY AS NEEDED FOR ANXIETY. 90 tablet 2   busPIRone (BUSPAR) 30 MG tablet TAKE 1 TABLET BY MOUTH 2 TIMES DAILY. 180 tablet 0   cholecalciferol (VITAMIN D3) 25 MCG (1000 UNIT)  tablet Take 2,000 Units by mouth daily.     folic acid (FOLVITE) 1 MG tablet Take 1 mg by mouth daily.     lamoTRIgine (LAMICTAL) 150 MG tablet Take 1 tablet (150 mg total) by mouth 2 (two) times daily. 60 tablet 5   risperidone (RISPERDAL) 4 MG tablet TAKE 1 TABLET DAILY 90 tablet 3   RIVAROXABAN (XARELTO) VTE STARTER PACK (15 & 20 MG) Follow package directions: Take one '15mg'$  tablet by mouth twice a day. On day 22, switch to one '20mg'$  tablet once a day. Take with food. 51 each 0   vitamin B-12 (CYANOCOBALAMIN) 1000 MCG tablet Take 1,000 mcg by mouth daily.     No current facility-administered medications for this visit.   Facility-Administered Medications Ordered in Other Visits  Medication Dose Route Frequency Provider Last Rate Last Admin   famotidine (PEPCID) IVPB 20 mg premix  20 mg Intravenous Once Alvy Bimler, Kerman Pfost, MD 200 mL/hr at 10/31/21 1030 20 mg at 10/31/21 1030   methylPREDNISolone sodium succinate (SOLU-MEDROL) 125 mg/2 mL injection 125 mg  125 mg Intravenous Once Alvy Bimler, Huntington Leverich, MD       riTUXimab-pvvr (RUXIENCE) 800 mg in sodium chloride 0.9 % 250 mL (2.4242 mg/mL) infusion  375 mg/m2 (Treatment Plan Recorded) Intravenous Once Judith Lark, MD         REVIEW OF SYSTEMS:   Constitutional: Denies fevers, chills or night sweats Eyes: Denies blurriness of vision Ears, nose, mouth, throat, and face: Denies mucositis or sore throat Respiratory: Denies cough, dyspnea or wheezes Cardiovascular: Denies palpitation, chest discomfort or lower extremity swelling Gastrointestinal:  Denies nausea, heartburn or change in bowel habits Skin: Denies abnormal skin rashes Lymphatics: Denies new lymphadenopathy or easy bruising Neurological:Denies numbness, tingling or new weaknesses Behavioral/Psych: Mood is stable, no new changes  All other systems were reviewed with the patient and are negative.  PHYSICAL EXAMINATION: ECOG PERFORMANCE STATUS: 0 - Asymptomatic  Vitals:   10/31/21 0857  BP:  112/74  Pulse: 78  Resp: 16  Temp: 98.9 F (37.2 C)  SpO2: 98%   Filed Weights   10/31/21 0857  Weight: 215 lb 6.4 oz (97.7 kg)    GENERAL:alert, no distress and comfortable NEURO: alert & oriented x 3 with fluent speech, no focal motor/sensory deficits  LABORATORY DATA:  I have reviewed the data as listed     Component Value Date/Time   NA 139 10/31/2021 0850   K 4.4 10/31/2021 0850   CL 110 10/31/2021 0850   CO2 25 10/31/2021 0850   GLUCOSE 98 10/31/2021 0850   BUN 12 10/31/2021 0850   CREATININE 0.91 10/31/2021 0850   CALCIUM 10.5 (H) 10/31/2021 0850   CALCIUM 10.4 (H) 04/22/2021 0119   PROT 6.8 10/31/2021 0850   ALBUMIN 4.3 10/31/2021 0850   AST 22 10/31/2021 0850   ALT 25 10/31/2021 0850   ALKPHOS 85 10/31/2021 0850   BILITOT 2.2 (H) 10/31/2021 0850   GFRNONAA >60 10/31/2021 0850   GFRAA >90 12/27/2013 1827    No results found for: "SPEP", "UPEP"  Lab Results  Component Value Date   WBC 6.0 10/31/2021   NEUTROABS 4.0 10/31/2021   HGB 8.7 (L) 10/31/2021   HCT 23.7 (L) 10/31/2021  MCV 103.0 (H) 10/31/2021   PLT 257 10/31/2021      Chemistry      Component Value Date/Time   NA 139 10/31/2021 0850   K 4.4 10/31/2021 0850   CL 110 10/31/2021 0850   CO2 25 10/31/2021 0850   BUN 12 10/31/2021 0850   CREATININE 0.91 10/31/2021 0850      Component Value Date/Time   CALCIUM 10.5 (H) 10/31/2021 0850   CALCIUM 10.4 (H) 04/22/2021 0119   ALKPHOS 85 10/31/2021 0850   AST 22 10/31/2021 0850   ALT 25 10/31/2021 0850   BILITOT 2.2 (H) 10/31/2021 7737

## 2021-11-05 ENCOUNTER — Other Ambulatory Visit: Payer: Self-pay | Admitting: Physician Assistant

## 2021-11-07 ENCOUNTER — Other Ambulatory Visit: Payer: Self-pay

## 2021-11-07 ENCOUNTER — Inpatient Hospital Stay (HOSPITAL_BASED_OUTPATIENT_CLINIC_OR_DEPARTMENT_OTHER): Payer: BC Managed Care – PPO | Admitting: Hematology and Oncology

## 2021-11-07 ENCOUNTER — Inpatient Hospital Stay: Payer: BC Managed Care – PPO

## 2021-11-07 ENCOUNTER — Encounter: Payer: Self-pay | Admitting: Hematology and Oncology

## 2021-11-07 ENCOUNTER — Telehealth: Payer: Self-pay

## 2021-11-07 VITALS — BP 106/70 | HR 67 | Temp 98.2°F | Resp 17

## 2021-11-07 DIAGNOSIS — D5912 Cold autoimmune hemolytic anemia: Secondary | ICD-10-CM

## 2021-11-07 DIAGNOSIS — E559 Vitamin D deficiency, unspecified: Secondary | ICD-10-CM | POA: Diagnosis not present

## 2021-11-07 DIAGNOSIS — I2699 Other pulmonary embolism without acute cor pulmonale: Secondary | ICD-10-CM | POA: Diagnosis not present

## 2021-11-07 DIAGNOSIS — R031 Nonspecific low blood-pressure reading: Secondary | ICD-10-CM | POA: Diagnosis not present

## 2021-11-07 DIAGNOSIS — Z86718 Personal history of other venous thrombosis and embolism: Secondary | ICD-10-CM | POA: Diagnosis not present

## 2021-11-07 DIAGNOSIS — D539 Nutritional anemia, unspecified: Secondary | ICD-10-CM | POA: Diagnosis not present

## 2021-11-07 DIAGNOSIS — R42 Dizziness and giddiness: Secondary | ICD-10-CM | POA: Diagnosis not present

## 2021-11-07 DIAGNOSIS — Z79899 Other long term (current) drug therapy: Secondary | ICD-10-CM | POA: Diagnosis not present

## 2021-11-07 DIAGNOSIS — E538 Deficiency of other specified B group vitamins: Secondary | ICD-10-CM | POA: Diagnosis not present

## 2021-11-07 DIAGNOSIS — G35 Multiple sclerosis: Secondary | ICD-10-CM | POA: Diagnosis not present

## 2021-11-07 DIAGNOSIS — B1920 Unspecified viral hepatitis C without hepatic coma: Secondary | ICD-10-CM | POA: Diagnosis not present

## 2021-11-07 DIAGNOSIS — D598 Other acquired hemolytic anemias: Secondary | ICD-10-CM

## 2021-11-07 DIAGNOSIS — Z7901 Long term (current) use of anticoagulants: Secondary | ICD-10-CM | POA: Diagnosis not present

## 2021-11-07 DIAGNOSIS — Z5112 Encounter for antineoplastic immunotherapy: Secondary | ICD-10-CM | POA: Diagnosis not present

## 2021-11-07 LAB — CBC WITH DIFFERENTIAL (CANCER CENTER ONLY)
Abs Immature Granulocytes: 0.02 10*3/uL (ref 0.00–0.07)
Basophils Absolute: 0 10*3/uL (ref 0.0–0.1)
Basophils Relative: 1 %
Eosinophils Absolute: 0.1 10*3/uL (ref 0.0–0.5)
Eosinophils Relative: 2 %
HCT: 29.6 % — ABNORMAL LOW (ref 36.0–46.0)
Hemoglobin: 10.9 g/dL — ABNORMAL LOW (ref 12.0–15.0)
Immature Granulocytes: 1 %
Lymphocytes Relative: 18 %
Lymphs Abs: 0.8 10*3/uL (ref 0.7–4.0)
MCH: 37.6 pg — ABNORMAL HIGH (ref 26.0–34.0)
MCHC: 36.8 g/dL — ABNORMAL HIGH (ref 30.0–36.0)
MCV: 102.1 fL — ABNORMAL HIGH (ref 80.0–100.0)
Monocytes Absolute: 0.3 10*3/uL (ref 0.1–1.0)
Monocytes Relative: 8 %
Neutro Abs: 2.9 10*3/uL (ref 1.7–7.7)
Neutrophils Relative %: 70 %
Platelet Count: 211 10*3/uL (ref 150–400)
RBC: 2.9 MIL/uL — ABNORMAL LOW (ref 3.87–5.11)
RDW: 17 % — ABNORMAL HIGH (ref 11.5–15.5)
WBC Count: 4.2 10*3/uL (ref 4.0–10.5)
nRBC: 0 % (ref 0.0–0.2)

## 2021-11-07 LAB — CMP (CANCER CENTER ONLY)
ALT: 39 U/L (ref 0–44)
AST: 26 U/L (ref 15–41)
Albumin: 4.3 g/dL (ref 3.5–5.0)
Alkaline Phosphatase: 78 U/L (ref 38–126)
Anion gap: 7 (ref 5–15)
BUN: 17 mg/dL (ref 6–20)
CO2: 24 mmol/L (ref 22–32)
Calcium: 10.7 mg/dL — ABNORMAL HIGH (ref 8.9–10.3)
Chloride: 107 mmol/L (ref 98–111)
Creatinine: 0.94 mg/dL (ref 0.44–1.00)
GFR, Estimated: >60 mL/min (ref >60)
Glucose, Bld: 147 mg/dL — ABNORMAL HIGH (ref 70–99)
Potassium: 4 mmol/L (ref 3.5–5.1)
Sodium: 138 mmol/L (ref 135–145)
Total Bilirubin: 2.4 mg/dL — ABNORMAL HIGH (ref 0.3–1.2)
Total Protein: 7.1 g/dL (ref 6.5–8.1)

## 2021-11-07 LAB — SAMPLE TO BLOOD BANK

## 2021-11-07 MED ORDER — FAMOTIDINE IN NACL 20-0.9 MG/50ML-% IV SOLN
20.0000 mg | Freq: Once | INTRAVENOUS | Status: AC
  Administered 2021-11-07: 20 mg via INTRAVENOUS
  Filled 2021-11-07: qty 50

## 2021-11-07 MED ORDER — ACETAMINOPHEN 325 MG PO TABS
650.0000 mg | ORAL_TABLET | Freq: Once | ORAL | Status: AC
  Administered 2021-11-07: 650 mg via ORAL
  Filled 2021-11-07: qty 2

## 2021-11-07 MED ORDER — METHYLPREDNISOLONE SODIUM SUCC 125 MG IJ SOLR
125.0000 mg | Freq: Once | INTRAMUSCULAR | Status: AC
  Administered 2021-11-07: 125 mg via INTRAVENOUS
  Filled 2021-11-07: qty 2

## 2021-11-07 MED ORDER — DIPHENHYDRAMINE HCL 25 MG PO CAPS
50.0000 mg | ORAL_CAPSULE | Freq: Once | ORAL | Status: AC
  Administered 2021-11-07: 50 mg via ORAL
  Filled 2021-11-07: qty 2

## 2021-11-07 MED ORDER — RIVAROXABAN 20 MG PO TABS: 20.0000 mg | ORAL_TABLET | Freq: Every day | ORAL | 11 refills | Status: DC

## 2021-11-07 MED ORDER — SODIUM CHLORIDE 0.9 % IV SOLN
375.0000 mg/m2 | Freq: Once | INTRAVENOUS | Status: AC
  Administered 2021-11-07: 800 mg via INTRAVENOUS
  Filled 2021-11-07: qty 50

## 2021-11-07 MED ORDER — SODIUM CHLORIDE 0.9 % IV SOLN: Freq: Once | INTRAVENOUS | Status: AC

## 2021-11-07 NOTE — Patient Instructions (Signed)
[facility]  Discharge Instructions: Thank you for choosing [facility] to provide your oncology and hematology care.   If you have a lab appointment with the [facility], please go directly to the [facility] and check in at the registration area.   Wear comfortable clothing and clothing appropriate for easy access to any Portacath or PICC line.   We strive to give you quality time with your provider. You may need to reschedule your appointment if you arrive late (15 or more minutes).  Arriving late affects you and other patients whose appointments are after yours.  Also, if you miss three or more appointments without notifying the office, you may be dismissed from the clinic at the provider's discretion.      For prescription refill requests, have your pharmacy contact our office and allow 72 hours for refills to be completed.    Today you received the following chemotherapy and/or immunotherapy agents: Ruxience      To help prevent nausea and vomiting after your treatment, we encourage you to take your nausea medication as directed.  BELOW ARE SYMPTOMS THAT SHOULD BE REPORTED IMMEDIATELY: *FEVER GREATER THAN 100.4 F (38 C) OR HIGHER *CHILLS OR SWEATING *NAUSEA AND VOMITING THAT IS NOT CONTROLLED WITH YOUR NAUSEA MEDICATION *UNUSUAL SHORTNESS OF BREATH *UNUSUAL BRUISING OR BLEEDING *URINARY PROBLEMS (pain or burning when urinating, or frequent urination) *BOWEL PROBLEMS (unusual diarrhea, constipation, pain near the anus) TENDERNESS IN MOUTH AND THROAT WITH OR WITHOUT PRESENCE OF ULCERS (sore throat, sores in mouth, or a toothache) UNUSUAL RASH, SWELLING OR PAIN  UNUSUAL VAGINAL DISCHARGE OR ITCHING   Items with * indicate a potential emergency and should be followed up as soon as possible or go to the Emergency Department if any problems should occur.  Please show the CHEMOTHERAPY ALERT CARD or IMMUNOTHERAPY ALERT CARD at check-in to  the Emergency Department and triage nurse.  Should you have questions after your visit or need to cancel or reschedule your appointment, please contact [facility]  Dept: [phone]  and follow the prompts.  Office hours are 8:00 a.m. to 4:30 p.m. Monday - Friday. Please note that voicemails left after 4:00 p.m. may not be returned until the following business day.  We are closed weekends and major holidays. You have access to a nurse at all times for urgent questions. Please call the main number to the clinic Dept: [phone] and follow the prompts.   For any non-urgent questions, you may also contact your provider using MyChart. We now offer e-Visits for anyone 81 and older to request care online for non-urgent symptoms. For details visit [email or web address].   Also download the MyChart app! Go to the app store, search "MyChart", open the app, select [facility], and log in with your MyChart username and password.  Masks are optional in the cancer centers. If you would like for your care team to wear a mask while they are taking care of you, please let them know. For doctor visits, patients may have with them one support person who is at least 47 years old. At this time, visitors are not allowed in the infusion area.

## 2021-11-07 NOTE — Assessment & Plan Note (Signed)
She is doing well so far with anticoagulation therapy without bleeding complications She will continue for minimum 1 year

## 2021-11-07 NOTE — Telephone Encounter (Signed)
Notified Patient and Pharmacy of prior authorization approval for Xarelto '20mg'$  Tablets. Medication is approved through 11/07/2022. No other needs or concerns voiced at this time.

## 2021-11-07 NOTE — Assessment & Plan Note (Signed)
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

## 2021-11-07 NOTE — Progress Notes (Signed)
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   The total time spent in the appointment was 20 minutes encounter with patients including review of chart and various tests results, discussions about plan of care and coordination of care plan   All questions were answered. The patient knows to call the clinic with any problems, questions or concerns. No barriers to learning was detected.    Heath Lark, MD 7/24/20239:54 AM  INTERVAL HISTORY: [name] 46 y.o. female returns for further follow-up, seen prior to cycle 3 of weekly rituximab She had no infusion reaction with her last treatment She had recent menstruation but denies excessive bleeding No other forms of bleeding She is compliant taking vitamin B-12 and folate supplement She is still vaping intermittently  SUMMARY OF HEMATOLOGIC HISTORY: [name] 46 y.o. 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  She have history of hyper thyroidism status  post radioactive iodine treatment 15 years ago.  She also have history of DVT.  The patient currently uses tobacco products with vaping on a regular basis.  She has recent weakness and dizziness.  She had extensive work-up in January and recently.  She was found to have elevated homocystine level, borderline B12 deficiency, folate deficiency and vitamin D deficiency.  She has not been taking these vitamin replacement products  She was found to have abnormal CBC from recent blood work Her most recent CBC showed hemoglobin of 7.9 Reticulocyte count is inappropriately low for the degree of anemia She was also noticing passage of dark urine She denies recent chest pain on exertion, shortness of breath on minimal exertion, pre-syncopal episodes, or palpitations. She had not noticed any recent bleeding such as epistaxis, hematuria or hematochezia The patient denies over the counter NSAID ingestion. She is not on antiplatelets agents.  She had no prior history or diagnosis of cancer. Her age appropriate screening programs are up-to-date. She denies any pica and eats a variety of diet. She never donated blood or received blood transfusion CT imaging on October 14, 2021 showed evidence of DVT and PE, mild splenomegaly but no evidence of lymphoma On July 10, she was started on rituximab  I have reviewed the past medical history, past surgical history, social history and family history with the patient and they are unchanged from previous note.  ALLERGIES:  is allergic to rituximab.  MEDICATIONS:  Current Outpatient Medications  Medication Sig Dispense Refill   rivaroxaban (XARELTO) 20 MG TABS tablet Take 1 tablet (20 mg total) by mouth daily with supper. 30 tablet 11   ALPRAZolam Duanne Moron)  1 MG tablet TAKE 1 TABLET BY MOUTH 3 TIMES DAILY AS NEEDED FOR ANXIETY. 90 tablet 2   busPIRone (BUSPAR) 30 MG tablet TAKE 1 TABLET BY MOUTH 2 TIMES DAILY. 180 tablet 0   cholecalciferol (VITAMIN D3) 25 MCG (1000 UNIT)  tablet Take 2,000 Units by mouth daily.     folic acid (FOLVITE) 1 MG tablet Take 1 mg by mouth daily.     lamoTRIgine (LAMICTAL) 150 MG tablet Take 1 tablet (150 mg total) by mouth 2 (two) times daily. 60 tablet 5   risperidone (RISPERDAL) 4 MG tablet TAKE 1 TABLET DAILY 90 tablet 3   vitamin B-12 (CYANOCOBALAMIN) 1000 MCG tablet Take 1,000 mcg by mouth daily.     No current facility-administered medications for this visit.     REVIEW OF SYSTEMS:   Constitutional: Denies fevers, chills or night sweats Eyes: Denies blurriness of vision Ears, nose, mouth, throat, and face: Denies mucositis or sore throat Respiratory: Denies cough, dyspnea or wheezes Cardiovascular: Denies palpitation, chest discomfort or lower extremity swelling Gastrointestinal:  Denies nausea, heartburn or change in bowel habits Skin: Denies abnormal skin rashes Lymphatics: Denies new lymphadenopathy or easy bruising Neurological:Denies numbness, tingling or new weaknesses Behavioral/Psych: Mood is stable, no new changes  All other systems were reviewed with the patient and are negative.  PHYSICAL EXAMINATION: ECOG PERFORMANCE STATUS: 0 - Asymptomatic  Vitals:   11/07/21 0945  BP: 112/60  Pulse: 72  Resp: 18  SpO2: 100%   Filed Weights   11/07/21 0945  Weight: 207 lb 12.8 oz (94.3 kg)    GENERAL:alert, no distress and comfortable NEURO: alert & oriented x 3 with fluent speech, no focal motor/sensory deficits  LABORATORY DATA:  I have reviewed the data as listed     Component Value Date/Time   NA 139 10/31/2021 0850   K 4.4 10/31/2021 0850   CL 110 10/31/2021 0850   CO2 25 10/31/2021 0850   GLUCOSE 98 10/31/2021 0850   BUN 12 10/31/2021 0850   CREATININE 0.91 10/31/2021 0850   CALCIUM 10.5 (H) 10/31/2021 0850   CALCIUM 10.4 (H) 04/22/2021 0119   PROT 6.8 10/31/2021 0850   ALBUMIN 4.3 10/31/2021 0850   AST 22 10/31/2021 0850   ALT 25 10/31/2021 0850   ALKPHOS 85 10/31/2021 0850   BILITOT 2.2  (H) 10/31/2021 0850   GFRNONAA >60 10/31/2021 0850   GFRAA >90 12/27/2013 1827    No results found for: "SPEP", "UPEP"  Lab Results  Component Value Date   WBC 4.2 11/07/2021   NEUTROABS 2.9 11/07/2021   HGB 10.9 (L) 11/07/2021   HCT 29.6 (L) 11/07/2021   MCV 102.1 (H) 11/07/2021   PLT 211 11/07/2021      Chemistry      Component Value Date/Time   NA 139 10/31/2021 0850   K 4.4 10/31/2021 0850   CL 110 10/31/2021 0850   CO2 25 10/31/2021 0850   BUN 12 10/31/2021 0850   CREATININE 0.91 10/31/2021 0850      Component Value Date/Time   CALCIUM 10.5 (H) 10/31/2021 0850   CALCIUM 10.4 (H) 04/22/2021 0119   ALKPHOS 85 10/31/2021 0850   AST 22 10/31/2021 0850   ALT 25 10/31/2021 0850   BILITOT 2.2 (H) 10/31/2021 1610

## 2021-11-08 ENCOUNTER — Encounter: Payer: Self-pay | Admitting: Hematology and Oncology

## 2021-11-08 ENCOUNTER — Other Ambulatory Visit: Payer: Self-pay | Admitting: Hematology and Oncology

## 2021-11-08 ENCOUNTER — Telehealth: Payer: Self-pay

## 2021-11-08 DIAGNOSIS — R110 Nausea: Secondary | ICD-10-CM | POA: Insufficient documentation

## 2021-11-08 MED ORDER — ONDANSETRON HCL 8 MG PO TABS: 8.0000 mg | ORAL_TABLET | Freq: Three times a day (TID) | ORAL | 3 refills | Status: DC | PRN

## 2021-11-08 NOTE — Telephone Encounter (Signed)
Rituxan is not the cause of nausea I will send prescription zofran to her pharmacy

## 2021-11-08 NOTE — Telephone Encounter (Signed)
Called regarding mychart message complaining of being sick after infusion yesterday. She started vomiting at 4 am this am and it stopped after taking some pepto bismol.She is able to drink. She is not able to eat. C/o of abdominal pain.  She is concerned about missing work today due to not feeling well. She is asking medication for nausea today if possible sent to [facility] on [address].

## 2021-11-08 NOTE — Telephone Encounter (Signed)
Called and given below message. She verbalized understanding and appreciated the call . °

## 2021-11-14 ENCOUNTER — Inpatient Hospital Stay (HOSPITAL_BASED_OUTPATIENT_CLINIC_OR_DEPARTMENT_OTHER): Payer: BC Managed Care – PPO | Admitting: Hematology and Oncology

## 2021-11-14 ENCOUNTER — Encounter: Payer: Self-pay | Admitting: Hematology and Oncology

## 2021-11-14 ENCOUNTER — Inpatient Hospital Stay: Payer: BC Managed Care – PPO

## 2021-11-14 ENCOUNTER — Other Ambulatory Visit: Payer: Self-pay

## 2021-11-14 VITALS — BP 120/66 | HR 107 | Temp 98.3°F | Resp 18 | Ht 65.0 in | Wt 202.8 lb

## 2021-11-14 VITALS — BP 107/58 | HR 61 | Temp 98.9°F | Resp 17

## 2021-11-14 DIAGNOSIS — Z7901 Long term (current) use of anticoagulants: Secondary | ICD-10-CM | POA: Diagnosis not present

## 2021-11-14 DIAGNOSIS — D5912 Cold autoimmune hemolytic anemia: Secondary | ICD-10-CM | POA: Diagnosis not present

## 2021-11-14 DIAGNOSIS — Z86718 Personal history of other venous thrombosis and embolism: Secondary | ICD-10-CM | POA: Diagnosis not present

## 2021-11-14 DIAGNOSIS — E538 Deficiency of other specified B group vitamins: Secondary | ICD-10-CM | POA: Diagnosis not present

## 2021-11-14 DIAGNOSIS — I2699 Other pulmonary embolism without acute cor pulmonale: Secondary | ICD-10-CM | POA: Diagnosis not present

## 2021-11-14 DIAGNOSIS — D539 Nutritional anemia, unspecified: Secondary | ICD-10-CM | POA: Insufficient documentation

## 2021-11-14 DIAGNOSIS — Z5112 Encounter for antineoplastic immunotherapy: Secondary | ICD-10-CM | POA: Diagnosis not present

## 2021-11-14 DIAGNOSIS — R42 Dizziness and giddiness: Secondary | ICD-10-CM | POA: Diagnosis not present

## 2021-11-14 DIAGNOSIS — G35 Multiple sclerosis: Secondary | ICD-10-CM | POA: Diagnosis not present

## 2021-11-14 DIAGNOSIS — B1920 Unspecified viral hepatitis C without hepatic coma: Secondary | ICD-10-CM | POA: Diagnosis not present

## 2021-11-14 DIAGNOSIS — E559 Vitamin D deficiency, unspecified: Secondary | ICD-10-CM | POA: Diagnosis not present

## 2021-11-14 DIAGNOSIS — Z79899 Other long term (current) drug therapy: Secondary | ICD-10-CM | POA: Diagnosis not present

## 2021-11-14 DIAGNOSIS — D598 Other acquired hemolytic anemias: Secondary | ICD-10-CM

## 2021-11-14 DIAGNOSIS — R031 Nonspecific low blood-pressure reading: Secondary | ICD-10-CM | POA: Diagnosis not present

## 2021-11-14 HISTORY — DX: Nutritional anemia, unspecified: D53.9

## 2021-11-14 LAB — CBC WITH DIFFERENTIAL (CANCER CENTER ONLY)
Abs Immature Granulocytes: 0.03 10*3/uL (ref 0.00–0.07)
Basophils Absolute: 0.1 10*3/uL (ref 0.0–0.1)
Basophils Relative: 1 %
Eosinophils Absolute: 0.1 10*3/uL (ref 0.0–0.5)
Eosinophils Relative: 1 %
HCT: 22.3 % — ABNORMAL LOW (ref 36.0–46.0)
Hemoglobin: 7.9 g/dL — ABNORMAL LOW (ref 12.0–15.0)
Immature Granulocytes: 0 %
Lymphocytes Relative: 7 %
Lymphs Abs: 0.6 10*3/uL — ABNORMAL LOW (ref 0.7–4.0)
MCH: 35.6 pg — ABNORMAL HIGH (ref 26.0–34.0)
MCHC: 35.4 g/dL (ref 30.0–36.0)
MCV: 100.5 fL — ABNORMAL HIGH (ref 80.0–100.0)
Monocytes Absolute: 0.7 10*3/uL (ref 0.1–1.0)
Monocytes Relative: 8 %
Neutro Abs: 7.3 10*3/uL (ref 1.7–7.7)
Neutrophils Relative %: 83 %
Platelet Count: 543 10*3/uL — ABNORMAL HIGH (ref 150–400)
RBC: 2.22 MIL/uL — ABNORMAL LOW (ref 3.87–5.11)
RDW: 15.2 % (ref 11.5–15.5)
WBC Count: 8.7 10*3/uL (ref 4.0–10.5)
nRBC: 0 % (ref 0.0–0.2)

## 2021-11-14 LAB — CMP (CANCER CENTER ONLY)
ALT: 40 U/L (ref 0–44)
AST: 21 U/L (ref 15–41)
Albumin: 4 g/dL (ref 3.5–5.0)
Alkaline Phosphatase: 78 U/L (ref 38–126)
Anion gap: 7 (ref 5–15)
BUN: 22 mg/dL — ABNORMAL HIGH (ref 6–20)
CO2: 26 mmol/L (ref 22–32)
Calcium: 10.8 mg/dL — ABNORMAL HIGH (ref 8.9–10.3)
Chloride: 103 mmol/L (ref 98–111)
Creatinine: 0.91 mg/dL (ref 0.44–1.00)
GFR, Estimated: >60 mL/min (ref >60)
Glucose, Bld: 146 mg/dL — ABNORMAL HIGH (ref 70–99)
Potassium: 3.2 mmol/L — ABNORMAL LOW (ref 3.5–5.1)
Sodium: 136 mmol/L (ref 135–145)
Total Bilirubin: 2.6 mg/dL — ABNORMAL HIGH (ref 0.3–1.2)
Total Protein: 7.1 g/dL (ref 6.5–8.1)

## 2021-11-14 LAB — SAMPLE TO BLOOD BANK

## 2021-11-14 LAB — PREPARE RBC (CROSSMATCH)

## 2021-11-14 MED ORDER — OMEPRAZOLE 40 MG PO CPDR: 40.0000 mg | DELAYED_RELEASE_CAPSULE | Freq: Every day | ORAL | 3 refills | Status: DC

## 2021-11-14 MED ORDER — FAMOTIDINE IN NACL 20-0.9 MG/50ML-% IV SOLN
20.0000 mg | Freq: Once | INTRAVENOUS | Status: AC
  Administered 2021-11-14: 20 mg via INTRAVENOUS
  Filled 2021-11-14: qty 50

## 2021-11-14 MED ORDER — METHYLPREDNISOLONE SODIUM SUCC 125 MG IJ SOLR
125.0000 mg | Freq: Once | INTRAMUSCULAR | Status: AC
  Administered 2021-11-14: 125 mg via INTRAVENOUS
  Filled 2021-11-14: qty 2

## 2021-11-14 MED ORDER — SODIUM CHLORIDE 0.9 % IV SOLN
375.0000 mg/m2 | Freq: Once | INTRAVENOUS | Status: AC
  Administered 2021-11-14: 800 mg via INTRAVENOUS
  Filled 2021-11-14: qty 50

## 2021-11-14 MED ORDER — SODIUM CHLORIDE 0.9% IV SOLUTION: 250.0000 mL | Freq: Once | INTRAVENOUS | Status: DC

## 2021-11-14 MED ORDER — DIPHENHYDRAMINE HCL 25 MG PO CAPS
50.0000 mg | ORAL_CAPSULE | Freq: Once | ORAL | Status: AC
  Administered 2021-11-14: 50 mg via ORAL
  Filled 2021-11-14: qty 2

## 2021-11-14 MED ORDER — SODIUM CHLORIDE 0.9 % IV SOLN: Freq: Once | INTRAVENOUS | Status: AC

## 2021-11-14 MED ORDER — ACETAMINOPHEN 325 MG PO TABS
650.0000 mg | ORAL_TABLET | Freq: Once | ORAL | Status: AC
  Administered 2021-11-14: 650 mg via ORAL
  Filled 2021-11-14: qty 2

## 2021-11-14 NOTE — Assessment & Plan Note (Signed)
She is doing well on Xarelto She had recent significant menorrhagia This is likely the cause of her anemia

## 2021-11-14 NOTE — Assessment & Plan Note (Signed)
She will proceed with cycle 4 of rituximab today I am concerned about ongoing signs of hemolysis with elevated total bilirubin In addition to rituximab, she will also receive a unit of blood I plan to see her again in about 2 weeks I recommend active surveillance for the next 4 to 6 weeks If she has no signs of improvement, we will have to switch her treatment

## 2021-11-14 NOTE — Patient Instructions (Signed)
[facility]  Discharge Instructions: Thank you for choosing [facility] to provide your oncology and hematology care.   If you have a lab appointment with the [facility], please go directly to the [facility] and check in at the registration area.   Wear comfortable clothing and clothing appropriate for easy access to any Portacath or PICC line.   We strive to give you quality time with your provider. You may need to reschedule your appointment if you arrive late (15 or more minutes).  Arriving late affects you and other patients whose appointments are after yours.  Also, if you miss three or more appointments without notifying the office, you may be dismissed from the clinic at the provider's discretion.      For prescription refill requests, have your pharmacy contact our office and allow 72 hours for refills to be completed.    Today you received the following chemotherapy and/or immunotherapy agents: Ruxience      To help prevent nausea and vomiting after your treatment, we encourage you to take your nausea medication as directed.  BELOW ARE SYMPTOMS THAT SHOULD BE REPORTED IMMEDIATELY: *FEVER GREATER THAN 100.4 F (38 C) OR HIGHER *CHILLS OR SWEATING *NAUSEA AND VOMITING THAT IS NOT CONTROLLED WITH YOUR NAUSEA MEDICATION *UNUSUAL SHORTNESS OF BREATH *UNUSUAL BRUISING OR BLEEDING *URINARY PROBLEMS (pain or burning when urinating, or frequent urination) *BOWEL PROBLEMS (unusual diarrhea, constipation, pain near the anus) TENDERNESS IN MOUTH AND THROAT WITH OR WITHOUT PRESENCE OF ULCERS (sore throat, sores in mouth, or a toothache) UNUSUAL RASH, SWELLING OR PAIN  UNUSUAL VAGINAL DISCHARGE OR ITCHING   Items with * indicate a potential emergency and should be followed up as soon as possible or go to the Emergency Department if any problems should occur.  Please show the CHEMOTHERAPY ALERT CARD or IMMUNOTHERAPY ALERT CARD at check-in to  the Emergency Department and triage nurse.  Should you have questions after your visit or need to cancel or reschedule your appointment, please contact [facility]  Dept: [phone]  and follow the prompts.  Office hours are 8:00 a.m. to 4:30 p.m. Monday - Friday. Please note that voicemails left after 4:00 p.m. may not be returned until the following business day.  We are closed weekends and major holidays. You have access to a nurse at all times for urgent questions. Please call the main number to the clinic Dept: [phone] and follow the prompts.   For any non-urgent questions, you may also contact your provider using MyChart. We now offer e-Visits for anyone 92 and older to request care online for non-urgent symptoms. For details visit [email or web address].   Also download the MyChart app! Go to the app store, search "MyChart", open the app, select [facility], and log in with your MyChart username and password.  Masks are optional in the cancer centers. If you would like for your care team to wear a mask while they are taking care of you, please let them know. For doctor visits, patients may have with them one support person who is at least 47 years old. At this time, visitors are not allowed in the infusion area. Blood Transfusion, Adult, Care After This sheet gives you information about how to care for yourself after your procedure. Your doctor may also give you more specific instructions. If you have problems or questions, contact your doctor. What can I expect after the procedure? After the procedure, it is common to  have: Bruising and soreness at the IV site. A headache. Follow these instructions at home: Insertion site care     Follow instructions from your doctor about how to take care of your insertion site. This is where an IV tube was put into your vein. Make sure you: Wash your hands with soap and water before and after you change your  bandage (dressing). If you cannot use soap and water, use hand sanitizer. Change your bandage as told by your doctor. Check your insertion site every day for signs of infection. Check for: Redness, swelling, or pain. Bleeding from the site. Warmth. Pus or a bad smell. General instructions Take over-the-counter and prescription medicines only as told by your doctor. Rest as told by your doctor. Go back to your normal activities as told by your doctor. Keep all follow-up visits as told by your doctor. This is important. Contact a doctor if: You have itching or red, swollen areas of skin (hives). You feel worried or nervous (anxious). You feel weak after doing your normal activities. You have redness, swelling, warmth, or pain around the insertion site. You have blood coming from the insertion site, and the blood does not stop with pressure. You have pus or a bad smell coming from the insertion site. Get help right away if: You have signs of a serious reaction. This may be coming from an allergy or the body's defense system (immune system). Signs include: Trouble breathing or shortness of breath. Swelling of the face or feeling warm (flushed). Fever or chills. Head, chest, or back pain. Dark pee (urine) or blood in the pee. Widespread rash. Fast heartbeat. Feeling dizzy or light-headed. You may receive your blood transfusion in an outpatient setting. If so, you will be told whom to contact to report any reactions. These symptoms may be an emergency. Do not wait to see if the symptoms will go away. Get medical help right away. Call your local emergency services (911 in the [place] Do not drive yourself to the hospital. Summary Bruising and soreness at the IV site are common. Check your insertion site every day for signs of infection. Rest as told by your doctor. Go back to your normal activities as told by your doctor. Get help right away if you have signs of a serious reaction. This  information is not intended to replace advice given to you by your health care provider. Make sure you discuss any questions you have with your health care provider. Document Revised: 07/29/2020 Document Reviewed: 09/26/2018 Elsevier Patient Education  [employer].

## 2021-11-14 NOTE — Assessment & Plan Note (Signed)
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 The patient is symptomatic from anemia and the hemoglobin level is critically low.  Some of the side-effects to be expected including risks of transfusion reactions, chills, infection, syndrome of volume overload and risk of hospitalization from various reasons and the patient is willing to proceed and went ahead to sign consent today.

## 2021-11-14 NOTE — Progress Notes (Signed)
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 today I am concerned about ongoing signs of hemolysis with elevated total bilirubin In addition to rituximab, she will also receive a unit of blood I plan to see her again in about 2 weeks I recommend active surveillance for the next 4 to 6 weeks If she has no signs of improvement, we will have to switch her treatment  Pulmonary embolus ([place]) She is doing well on Xarelto She had recent significant menorrhagia This is likely the cause of her anemia  Deficiency anemia She has multifactorial anemia, related to recent menstruation, 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 The patient is symptomatic from anemia and the hemoglobin level is critically low.  Some of the side-effects to be expected including risks of transfusion reactions, chills, infection, syndrome of volume overload and risk of hospitalization from various reasons and the patient is willing to proceed and went ahead to sign consent today.   Orders Placed This Encounter  Procedures   Informed Consent Details: Physician/Practitioner Attestation; Transcribe to consent form and obtain patient signature    Standing Status:   Future    Standing Expiration Date:   11/15/2022    Order Specific Question:   Physician/Practitioner attestation of informed consent for blood and or blood product transfusion    Answer:   I, the physician/practitioner, attest that I have discussed with the patient the benefits, risks, side effects, alternatives, likelihood of achieving goals and potential problems during recovery for the procedure that I have provided informed consent.    Order Specific Question:    Product(s)    Answer:   All Product(s)   Care order/instruction    Transfuse Parameters    Standing Status:   Future    Standing Expiration Date:   11/14/2022   Type and screen         Standing Status:   Future    Number of Occurrences:   1    Standing Expiration Date:   11/15/2022   Prepare RBC (crossmatch)    Standing Status:   Standing    Number of Occurrences:   1    Order Specific Question:   # of Units    Answer:   1 unit    Order Specific Question:   Transfusion Indications    Answer:   Symptomatic Anemia    Order Specific Question:   Number of Units to Keep Ahead    Answer:   NO units ahead    Order Specific Question:   Instructions:    Answer:   Transfuse    Order Specific Question:   If emergent release call blood bank    Answer:   Not emergent release    The total time spent in the appointment was 40 minutes encounter with patients including review of chart and various tests results, discussions about plan of care and coordination of care plan   All questions were answered. The patient knows to call the clinic with any problems, questions or concerns. No barriers to learning was detected.    Heath Lark, MD 7/31/202310:21 AM  INTERVAL HISTORY: Maylynn Orzechowski 47 y.o. female returns for further follow-up related to cold agglutinin disease, seen prior to  cycle 4 of rituximab She had no recent infusion reaction She has significant prolonged menstrual cycle due to Xarelto She denies recent vaping She is compliant taking her Xarelto and vitamin V61 and folic acid supplement  SUMMARY OF HEMATOLOGIC HISTORY: Alexius Ellington 46 y.o. 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  She have history of hyper  thyroidism status post radioactive iodine treatment 15 years ago.  She also have history of DVT.  The patient currently uses tobacco products with vaping on a regular basis.  She has recent weakness and dizziness.  She had extensive work-up in January and recently.  She was found to have elevated homocystine level, borderline B12 deficiency, folate deficiency and vitamin D deficiency.  She has not been taking these vitamin replacement products  She was found to have abnormal CBC from recent blood work Her most recent CBC showed hemoglobin of 7.9 Reticulocyte count is inappropriately low for the degree of anemia She was also noticing passage of dark urine She denies recent chest pain on exertion, shortness of breath on minimal exertion, pre-syncopal episodes, or palpitations. She had not noticed any recent bleeding such as epistaxis, hematuria or hematochezia The patient denies over the counter NSAID ingestion. She is not on antiplatelets agents.  She had no prior history or diagnosis of cancer. Her age appropriate screening programs are up-to-date. She denies any pica and eats a variety of diet. She never donated blood or received blood transfusion CT imaging on October 14, 2021 showed evidence of DVT and PE, mild splenomegaly but no evidence of lymphoma On July 10, she was started on rituximab On November 14, 2021, she received last dose of rituximab and 1 unit of blood  I have reviewed the past medical history, past surgical history, social history and family history with the patient and they are unchanged from previous note.  ALLERGIES:  is allergic to rituximab.  MEDICATIONS:  Current Outpatient Medications  Medication Sig Dispense Refill   omeprazole (PRILOSEC) 40 MG capsule Take 1 capsule (40 mg total) by mouth daily. 30 capsule 3   ALPRAZolam (XANAX) 1 MG tablet TAKE 1 TABLET BY MOUTH 3 TIMES DAILY AS NEEDED FOR ANXIETY. 90 tablet 2   busPIRone (BUSPAR) 30 MG tablet TAKE 1 TABLET BY MOUTH 2  TIMES DAILY. 180 tablet 0   cholecalciferol (VITAMIN D3) 25 MCG (1000 UNIT) tablet Take 2,000 Units by mouth daily.     folic acid (FOLVITE) 1 MG tablet Take 1 mg by mouth daily.     lamoTRIgine (LAMICTAL) 150 MG tablet TAKE 1 TABLET BY MOUTH EVERY DAY 30 tablet 3   ondansetron (ZOFRAN) 8 MG tablet Take 1 tablet (8 mg total) by mouth every 8 (eight) hours as needed for nausea. 30 tablet 3   risperidone (RISPERDAL) 4 MG tablet TAKE 1 TABLET DAILY 90 tablet 3   rivaroxaban (XARELTO) 20 MG TABS tablet Take 1 tablet (20 mg total) by mouth daily with supper. 30 tablet 11   vitamin B-12 (CYANOCOBALAMIN) 1000 MCG tablet Take 1,000 mcg by mouth daily.     No current facility-administered medications for this visit.   Facility-Administered Medications Ordered in Other Visits  Medication Dose Route Frequency Provider Last Rate Last Admin  methylPREDNISolone sodium succinate (SOLU-MEDROL) 125 mg/2 mL injection 125 mg  125 mg Intravenous Once Alvy Bimler, Ingeborg Fite, MD       riTUXimab-pvvr (RUXIENCE) 800 mg in sodium chloride 0.9 % 250 mL (2.4242 mg/mL) infusion  375 mg/m2 (Treatment Plan Recorded) Intravenous Once Heath Lark, MD         REVIEW OF SYSTEMS:   Constitutional: Denies fevers, chills or night sweats Eyes: Denies blurriness of vision Ears, nose, mouth, throat, and face: Denies mucositis or sore throat Respiratory: Denies cough, dyspnea or wheezes Cardiovascular: Denies palpitation, chest discomfort or lower extremity swelling Gastrointestinal:  Denies nausea, heartburn or change in bowel habits Skin: Denies abnormal skin rashes Lymphatics: Denies new lymphadenopathy or easy bruising Neurological:Denies numbness, tingling or new weaknesses Behavioral/Psych: Mood is stable, no new changes  All other systems were reviewed with the patient and are negative.  PHYSICAL EXAMINATION: ECOG PERFORMANCE STATUS: 1 - Symptomatic but completely ambulatory  Vitals:   11/14/21 0928  BP: 120/66  Pulse: (!)  107  Resp: 18  Temp: 98.3 F (36.8 C)  SpO2: 99%   Filed Weights   11/14/21 0928  Weight: 202 lb 12.8 oz (92 kg)    GENERAL:alert, no distress and comfortable NEURO: alert & oriented x 3 with fluent speech, no focal motor/sensory deficits  LABORATORY DATA:  I have reviewed the data as listed     Component Value Date/Time   NA 136 11/14/2021 0858   K 3.2 (L) 11/14/2021 0858   CL 103 11/14/2021 0858   CO2 26 11/14/2021 0858   GLUCOSE 146 (H) 11/14/2021 0858   BUN 22 (H) 11/14/2021 0858   CREATININE 0.91 11/14/2021 0858   CALCIUM 10.8 (H) 11/14/2021 0858   CALCIUM 10.4 (H) 04/22/2021 0119   PROT 7.1 11/14/2021 0858   ALBUMIN 4.0 11/14/2021 0858   AST 21 11/14/2021 0858   ALT 40 11/14/2021 0858   ALKPHOS 78 11/14/2021 0858   BILITOT 2.6 (H) 11/14/2021 0858   GFRNONAA >60 11/14/2021 0858   GFRAA >90 12/27/2013 1827    No results found for: "SPEP", "UPEP"  Lab Results  Component Value Date   WBC 8.7 11/14/2021   NEUTROABS 7.3 11/14/2021   HGB 7.9 (L) 11/14/2021   HCT 22.3 (L) 11/14/2021   MCV 100.5 (H) 11/14/2021   PLT 543 (H) 11/14/2021      Chemistry      Component Value Date/Time   NA 136 11/14/2021 0858   K 3.2 (L) 11/14/2021 0858   CL 103 11/14/2021 0858   CO2 26 11/14/2021 0858   BUN 22 (H) 11/14/2021 0858   CREATININE 0.91 11/14/2021 0858      Component Value Date/Time   CALCIUM 10.8 (H) 11/14/2021 0858   CALCIUM 10.4 (H) 04/22/2021 0119   ALKPHOS 78 11/14/2021 0858   AST 21 11/14/2021 0858   ALT 40 11/14/2021 0858   BILITOT 2.6 (H) 11/14/2021 0960

## 2021-11-15 LAB — TYPE AND SCREEN
ABO/RH(D): B POS
Antibody Screen: NEGATIVE
Unit division: 0

## 2021-11-15 LAB — BPAM RBC
Blood Product Expiration Date: 202308312359
ISSUE DATE / TIME: 202307311454
Unit Type and Rh: 5100

## 2021-11-16 ENCOUNTER — Encounter: Payer: Self-pay | Admitting: Hematology and Oncology

## 2021-11-25 ENCOUNTER — Encounter: Payer: Self-pay | Admitting: Hematology and Oncology

## 2021-11-28 ENCOUNTER — Inpatient Hospital Stay: Payer: BC Managed Care – PPO | Admitting: Hematology and Oncology

## 2021-11-28 ENCOUNTER — Inpatient Hospital Stay: Payer: BC Managed Care – PPO

## 2021-12-13 ENCOUNTER — Telehealth: Payer: Self-pay | Admitting: Hematology and Oncology

## 2021-12-13 NOTE — Telephone Encounter (Signed)
Contacted patient to schedule patient. Left message for patient to call back to schedule.

## 2021-12-20 ENCOUNTER — Telehealth: Payer: Self-pay

## 2021-12-20 NOTE — Telephone Encounter (Addendum)
Returned her call. She is trying to reschedule the appts she canceled due to insurance lapse.  She has new insurance that started on 9/1. She feels okay and just is thinks that her labs may have improved.

## 2021-12-20 NOTE — Telephone Encounter (Signed)
I will send LOS to see her on Friday

## 2021-12-22 ENCOUNTER — Other Ambulatory Visit: Payer: Self-pay | Admitting: Physician Assistant

## 2021-12-23 ENCOUNTER — Encounter: Payer: Self-pay | Admitting: Neurology

## 2021-12-23 ENCOUNTER — Other Ambulatory Visit: Payer: Self-pay

## 2021-12-23 ENCOUNTER — Inpatient Hospital Stay: Payer: BC Managed Care – PPO | Admitting: Hematology and Oncology

## 2021-12-23 ENCOUNTER — Inpatient Hospital Stay: Payer: BC Managed Care – PPO | Attending: Hematology and Oncology

## 2021-12-23 ENCOUNTER — Encounter: Payer: Self-pay | Admitting: Hematology and Oncology

## 2021-12-23 ENCOUNTER — Telehealth: Payer: Self-pay

## 2021-12-23 VITALS — BP 118/72 | HR 82 | Temp 98.1°F | Resp 18 | Ht 65.0 in | Wt 206.0 lb

## 2021-12-23 DIAGNOSIS — D5912 Cold autoimmune hemolytic anemia: Secondary | ICD-10-CM | POA: Insufficient documentation

## 2021-12-23 DIAGNOSIS — B1920 Unspecified viral hepatitis C without hepatic coma: Secondary | ICD-10-CM | POA: Insufficient documentation

## 2021-12-23 DIAGNOSIS — Z79899 Other long term (current) drug therapy: Secondary | ICD-10-CM | POA: Diagnosis not present

## 2021-12-23 DIAGNOSIS — E559 Vitamin D deficiency, unspecified: Secondary | ICD-10-CM | POA: Insufficient documentation

## 2021-12-23 DIAGNOSIS — G35 Multiple sclerosis: Secondary | ICD-10-CM | POA: Insufficient documentation

## 2021-12-23 DIAGNOSIS — Z86718 Personal history of other venous thrombosis and embolism: Secondary | ICD-10-CM | POA: Diagnosis not present

## 2021-12-23 DIAGNOSIS — E538 Deficiency of other specified B group vitamins: Secondary | ICD-10-CM | POA: Insufficient documentation

## 2021-12-23 DIAGNOSIS — Z7901 Long term (current) use of anticoagulants: Secondary | ICD-10-CM | POA: Diagnosis not present

## 2021-12-23 DIAGNOSIS — D598 Other acquired hemolytic anemias: Secondary | ICD-10-CM

## 2021-12-23 DIAGNOSIS — I2699 Other pulmonary embolism without acute cor pulmonale: Secondary | ICD-10-CM | POA: Diagnosis not present

## 2021-12-23 LAB — CMP (CANCER CENTER ONLY)
ALT: 23 U/L (ref 0–44)
AST: 21 U/L (ref 15–41)
Albumin: 4.4 g/dL (ref 3.5–5.0)
Alkaline Phosphatase: 91 U/L (ref 38–126)
Anion gap: 4 — ABNORMAL LOW (ref 5–15)
BUN: 8 mg/dL (ref 6–20)
CO2: 28 mmol/L (ref 22–32)
Calcium: 11.1 mg/dL — ABNORMAL HIGH (ref 8.9–10.3)
Chloride: 109 mmol/L (ref 98–111)
Creatinine: 0.96 mg/dL (ref 0.44–1.00)
GFR, Estimated: >60 mL/min (ref >60)
Glucose, Bld: 89 mg/dL (ref 70–99)
Potassium: 4 mmol/L (ref 3.5–5.1)
Sodium: 141 mmol/L (ref 135–145)
Total Bilirubin: 0.6 mg/dL (ref 0.3–1.2)
Total Protein: 7.3 g/dL (ref 6.5–8.1)

## 2021-12-23 LAB — CBC WITH DIFFERENTIAL (CANCER CENTER ONLY)
Abs Immature Granulocytes: 0.01 10*3/uL (ref 0.00–0.07)
Basophils Absolute: 0.1 10*3/uL (ref 0.0–0.1)
Basophils Relative: 1 %
Eosinophils Absolute: 0.1 10*3/uL (ref 0.0–0.5)
Eosinophils Relative: 2 %
HCT: 39.9 % (ref 36.0–46.0)
Hemoglobin: 13.9 g/dL (ref 12.0–15.0)
Immature Granulocytes: 0 %
Lymphocytes Relative: 16 %
Lymphs Abs: 0.8 10*3/uL (ref 0.7–4.0)
MCH: 33.3 pg (ref 26.0–34.0)
MCHC: 34.8 g/dL (ref 30.0–36.0)
MCV: 95.5 fL (ref 80.0–100.0)
Monocytes Absolute: 0.4 10*3/uL (ref 0.1–1.0)
Monocytes Relative: 9 %
Neutro Abs: 3.5 10*3/uL (ref 1.7–7.7)
Neutrophils Relative %: 72 %
Platelet Count: 218 10*3/uL (ref 150–400)
RBC: 4.18 MIL/uL (ref 3.87–5.11)
RDW: 12 % (ref 11.5–15.5)
WBC Count: 4.9 10*3/uL (ref 4.0–10.5)
nRBC: 0 % (ref 0.0–0.2)

## 2021-12-23 LAB — SAMPLE TO BLOOD BANK

## 2021-12-23 NOTE — Telephone Encounter (Signed)
Called and given below message. She verbalized understanding. 

## 2021-12-23 NOTE — Assessment & Plan Note (Addendum)
She has completed 4 cycles of weekly rituximab Unfortunately, the patient was lost to follow-up Thankfully, her CBC is now normal She will continue mineral replacement therapy with high-dose folic acid and S93 supplement for now

## 2021-12-23 NOTE — Assessment & Plan Note (Signed)
She is not symptomatic and is doing well with anticoagulation therapy It is not clear whether her blood clot has been present since February Duration of anticoagulation therapy should be 1 year I plan to recheck CT imaging again next year

## 2021-12-23 NOTE — Progress Notes (Signed)
[facility] OFFICE PROGRESS NOTE  [name], MD  ASSESSMENT & PLAN:  Cold agglutinin disease [facility]) She has completed 4 cycles of weekly rituximab Unfortunately, the patient was lost to follow-up Thankfully, her CBC is now normal She will continue mineral replacement therapy with high-dose folic acid and G99 supplement for now  Pulmonary embolus [facility]) She is not symptomatic and is doing well with anticoagulation therapy It is not clear whether her blood clot has been present since February Duration of anticoagulation therapy should be 1 year I plan to recheck CT imaging again next year  No orders of the defined types were placed in this encounter.   The total time spent in the appointment was 20 minutes encounter with patients including review of chart and various tests results, discussions about plan of care and coordination of care plan   All questions were answered. The patient knows to call the clinic with any problems, questions or concerns. No barriers to learning was detected.    Heath Lark, MD 9/8/20232:45 PM  INTERVAL HISTORY: Margy Sumler 46 y.o. female returns for further follow-up for history of cold agglutinin disease She received and completed 4 cycles of rituximab Unfortunately, she was lost to follow-up due to loss of insurance and now returns for further follow-up after she signed up for a new insurance policy She felt good  SUMMARY OF HEMATOLOGIC HISTORY: Priti Consoli 46 y.o. 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  She also have history of DVT.  The patient currently  uses tobacco products with vaping on a regular basis.  She has recent weakness and dizziness.  She had extensive work-up in January and recently.  She was found to have elevated homocystine level, borderline B12 deficiency, folate deficiency and vitamin D deficiency.  She has not been taking these vitamin replacement products  She was found to have abnormal CBC from recent blood work Her most recent CBC showed hemoglobin of 7.9 Reticulocyte count is inappropriately low for the degree of anemia She was also noticing passage of dark urine She denies recent chest pain on exertion, shortness of breath on minimal exertion, pre-syncopal episodes, or palpitations. She had not noticed any recent bleeding such as epistaxis, hematuria or hematochezia The patient denies over the counter NSAID ingestion. She is not on antiplatelets agents.  She had no prior history or diagnosis of cancer. Her age appropriate screening programs are up-to-date. She denies any pica and eats a variety of diet. She never donated blood or received blood transfusion CT imaging on October 14, 2021 showed evidence of DVT and PE, mild splenomegaly but no evidence of lymphoma On July 10, she was started on rituximab On November 14, 2021, she received last dose of rituximab and 1 unit of blood  I have reviewed the past medical history, past surgical history, social history and family history with the patient and they are unchanged from previous note.  ALLERGIES:  is allergic to rituximab.  MEDICATIONS:  Current Outpatient Medications  Medication Sig Dispense Refill   ALPRAZolam (XANAX) 1 MG tablet TAKE 1 TABLET BY MOUTH THREE TIMES A DAY AS NEEDED FOR  ANXIETY 90 tablet 1   busPIRone (BUSPAR) 30 MG tablet TAKE 1 TABLET BY MOUTH 2 TIMES DAILY. 180 tablet 0   cholecalciferol (VITAMIN D3) 25 MCG (1000 UNIT) tablet Take 2,000 Units by mouth daily.     folic acid (FOLVITE) 1 MG tablet Take 1 mg by mouth daily.     lamoTRIgine (LAMICTAL) 150  MG tablet TAKE 1 TABLET BY MOUTH EVERY DAY 30 tablet 3   omeprazole (PRILOSEC) 40 MG capsule Take 1 capsule (40 mg total) by mouth daily. 30 capsule 3   ondansetron (ZOFRAN) 8 MG tablet Take 1 tablet (8 mg total) by mouth every 8 (eight) hours as needed for nausea. 30 tablet 3   risperidone (RISPERDAL) 4 MG tablet TAKE 1 TABLET DAILY 90 tablet 3   rivaroxaban (XARELTO) 20 MG TABS tablet Take 1 tablet (20 mg total) by mouth daily with supper. 30 tablet 11   vitamin B-12 (CYANOCOBALAMIN) 1000 MCG tablet Take 1,000 mcg by mouth daily.     No current facility-administered medications for this visit.     REVIEW OF SYSTEMS:   Constitutional: Denies fevers, chills or night sweats Eyes: Denies blurriness of vision Ears, nose, mouth, throat, and face: Denies mucositis or sore throat Respiratory: Denies cough, dyspnea or wheezes Cardiovascular: Denies palpitation, chest discomfort or lower extremity swelling Gastrointestinal:  Denies nausea, heartburn or change in bowel habits Skin: Denies abnormal skin rashes Lymphatics: Denies new lymphadenopathy or easy bruising Neurological:Denies numbness, tingling or new weaknesses Behavioral/Psych: Mood is stable, no new changes  All other systems were reviewed with the patient and are negative.  PHYSICAL EXAMINATION: ECOG PERFORMANCE STATUS: 0 - Asymptomatic  Vitals:   12/23/21 1218  BP: 118/72  Pulse: 82  Resp: 18  Temp: 98.1 F (36.7 C)  SpO2: 99%   Filed Weights   12/23/21 1218  Weight: 206 lb (93.4 kg)    GENERAL:alert, no distress and comfortable NEURO: alert & oriented x 3 with fluent speech, no focal motor/sensory deficits  LABORATORY DATA:  I have reviewed the data as listed     Component Value Date/Time   NA 141 12/23/2021 1152   K 4.0 12/23/2021 1152   CL 109 12/23/2021 1152   CO2 28 12/23/2021 1152   GLUCOSE 89 12/23/2021 1152   BUN 8 12/23/2021 1152   CREATININE 0.96 12/23/2021 1152   CALCIUM 11.1 (H) 12/23/2021 1152    CALCIUM 10.4 (H) 04/22/2021 0119   PROT 7.3 12/23/2021 1152   ALBUMIN 4.4 12/23/2021 1152   AST 21 12/23/2021 1152   ALT 23 12/23/2021 1152   ALKPHOS 91 12/23/2021 1152   BILITOT 0.6 12/23/2021 1152   GFRNONAA >60 12/23/2021 1152   GFRAA >90 12/27/2013 1827    No results found for: "SPEP", "UPEP"  Lab Results  Component Value Date   WBC 4.9 12/23/2021   NEUTROABS 3.5 12/23/2021   HGB 13.9 12/23/2021   HCT 39.9 12/23/2021   MCV 95.5 12/23/2021   PLT 218 12/23/2021      Chemistry      Component Value Date/Time   NA 141 12/23/2021 1152   K 4.0 12/23/2021 1152   CL 109 12/23/2021 1152   CO2 28 12/23/2021 1152   BUN 8 12/23/2021 1152   CREATININE 0.96 12/23/2021 1152      Component Value Date/Time   CALCIUM 11.1 (H) 12/23/2021 1152   CALCIUM 10.4 (H) 04/22/2021 0119   ALKPHOS 91 12/23/2021 1152   AST 21 12/23/2021 1152   ALT 23 12/23/2021  1152   BILITOT 0.6 12/23/2021 1152

## 2021-12-23 NOTE — Telephone Encounter (Signed)
-----   Message from Heath Lark, MD sent at 12/23/2021  1:12 PM EDT ----- Her calcium level is very high Tell her to hold off vitamin D supplement and drink extra liquid in her next visit

## 2022-01-18 ENCOUNTER — Telehealth: Payer: Self-pay | Admitting: *Deleted

## 2022-01-18 NOTE — Chronic Care Management (AMB) (Signed)
  Care Coordination  Outreach Note  01/18/2022 Name: [name] MRN: [id number] DOB: 1976-03-15   Care Coordination Outreach Attempts: An unsuccessful telephone outreach was attempted today to offer the patient information about available care coordination services as a benefit of their health plan.   Follow Up Plan:  Additional outreach attempts will be made to offer the patient care coordination information and services.   Encounter Outcome:  No Answer  [place]  Direct Dial: [phone]

## 2022-01-23 NOTE — Chronic Care Management (AMB) (Signed)
  Care Coordination   Note   01/23/2022 Name: [name] MRN: [id number] DOB: 10-02-1975  [name] is a 46 y.o. year old female who sees [name], MD for primary care. I reached out to Huey Romans by phone today to offer care coordination services.  [name] was given information about Care Coordination services today including:   The Care Coordination services include support from the care team which includes your Nurse Coordinator, Clinical Social Worker, or Pharmacist.  The Care Coordination team is here to help remove barriers to the health concerns and goals most important to you. Care Coordination services are voluntary, and the patient may decline or stop services at any time by request to their care team member.   Care Coordination Consent Status: Patient did not agree to participate in care coordination services at this time.    Encounter Outcome:  Pt. Refused  [place]  Direct Dial: [phone]

## 2022-01-23 NOTE — Chronic Care Management (AMB) (Signed)
  Care Coordination  Outreach Note  01/23/2022 Name: Lyndzee Kliebert MRN: [id number] DOB: [birth year]   Care Coordination Outreach Attempts: A second unsuccessful outreach was attempted today to offer the patient with information about available care coordination services as a benefit of their health plan.     Follow Up Plan:  Additional outreach attempts will be made to offer the patient care coordination information and services.   Encounter Outcome:  No Answer  [place]  Direct Dial: [phone]

## 2022-01-26 ENCOUNTER — Inpatient Hospital Stay: Payer: BC Managed Care – PPO | Admitting: Hematology and Oncology

## 2022-01-26 ENCOUNTER — Encounter: Payer: Self-pay | Admitting: Hematology and Oncology

## 2022-01-26 ENCOUNTER — Telehealth: Payer: Self-pay

## 2022-01-26 ENCOUNTER — Inpatient Hospital Stay: Payer: BC Managed Care – PPO | Attending: Hematology and Oncology

## 2022-01-26 VITALS — BP 134/89 | HR 77 | Temp 98.4°F | Resp 18 | Ht 65.0 in | Wt 206.4 lb

## 2022-01-26 DIAGNOSIS — I2699 Other pulmonary embolism without acute cor pulmonale: Secondary | ICD-10-CM | POA: Diagnosis not present

## 2022-01-26 DIAGNOSIS — Z7901 Long term (current) use of anticoagulants: Secondary | ICD-10-CM | POA: Diagnosis not present

## 2022-01-26 DIAGNOSIS — E538 Deficiency of other specified B group vitamins: Secondary | ICD-10-CM | POA: Diagnosis not present

## 2022-01-26 DIAGNOSIS — R161 Splenomegaly, not elsewhere classified: Secondary | ICD-10-CM | POA: Diagnosis not present

## 2022-01-26 DIAGNOSIS — D598 Other acquired hemolytic anemias: Secondary | ICD-10-CM | POA: Diagnosis not present

## 2022-01-26 DIAGNOSIS — Z79899 Other long term (current) drug therapy: Secondary | ICD-10-CM | POA: Diagnosis not present

## 2022-01-26 DIAGNOSIS — E559 Vitamin D deficiency, unspecified: Secondary | ICD-10-CM | POA: Insufficient documentation

## 2022-01-26 DIAGNOSIS — D5912 Cold autoimmune hemolytic anemia: Secondary | ICD-10-CM

## 2022-01-26 DIAGNOSIS — D472 Monoclonal gammopathy: Secondary | ICD-10-CM | POA: Diagnosis not present

## 2022-01-26 LAB — CMP (CANCER CENTER ONLY)
ALT: 19 U/L (ref 0–44)
AST: 19 U/L (ref 15–41)
Albumin: 4.4 g/dL (ref 3.5–5.0)
Alkaline Phosphatase: 89 U/L (ref 38–126)
Anion gap: 3 — ABNORMAL LOW (ref 5–15)
BUN: 13 mg/dL (ref 6–20)
CO2: 28 mmol/L (ref 22–32)
Calcium: 10.3 mg/dL (ref 8.9–10.3)
Chloride: 107 mmol/L (ref 98–111)
Creatinine: 0.84 mg/dL (ref 0.44–1.00)
GFR, Estimated: >60 mL/min (ref >60)
Glucose, Bld: 92 mg/dL (ref 70–99)
Potassium: 3.8 mmol/L (ref 3.5–5.1)
Sodium: 138 mmol/L (ref 135–145)
Total Bilirubin: 0.6 mg/dL (ref 0.3–1.2)
Total Protein: 7.3 g/dL (ref 6.5–8.1)

## 2022-01-26 LAB — CBC WITH DIFFERENTIAL (CANCER CENTER ONLY)
Abs Immature Granulocytes: 0.01 10*3/uL (ref 0.00–0.07)
Basophils Absolute: 0.1 10*3/uL (ref 0.0–0.1)
Basophils Relative: 1 %
Eosinophils Absolute: 0.1 10*3/uL (ref 0.0–0.5)
Eosinophils Relative: 1 %
HCT: 40.9 % (ref 36.0–46.0)
Hemoglobin: 14.7 g/dL (ref 12.0–15.0)
Immature Granulocytes: 0 %
Lymphocytes Relative: 17 %
Lymphs Abs: 0.8 10*3/uL (ref 0.7–4.0)
MCH: 32.2 pg (ref 26.0–34.0)
MCHC: 35.9 g/dL (ref 30.0–36.0)
MCV: 89.5 fL (ref 80.0–100.0)
Monocytes Absolute: 0.5 10*3/uL (ref 0.1–1.0)
Monocytes Relative: 11 %
Neutro Abs: 3.1 10*3/uL (ref 1.7–7.7)
Neutrophils Relative %: 70 %
Platelet Count: 223 10*3/uL (ref 150–400)
RBC: 4.57 MIL/uL (ref 3.87–5.11)
RDW: 11.9 % (ref 11.5–15.5)
WBC Count: 4.4 10*3/uL (ref 4.0–10.5)
nRBC: 0 % (ref 0.0–0.2)

## 2022-01-26 NOTE — Progress Notes (Signed)
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 Her blood count remains normal [name] had splenomegaly and blood clot noted on her previous CT imaging in June I plan to repeat CT imaging in 6 months [name] will continue mineral replacement therapy with high-dose folic acid and H82 supplement for now  Folate deficiency [name] will continue high-dose replacement therapy  Vitamin D deficiency [name] has history of vitamin D deficiency, discontinued due to high calcium Her calcium is back to normal [name] will resume vitamin D  Pulmonary embolus ([place]) [name] has no recent bleeding complications from anticoagulation therapy I plan to repeat imaging study in 6 months If [name] has no evidence of residual clot on CT imaging, we will stop her anticoagulation therapy I recommend the patient not to vape in the future as smoking will increase her risk of blood clot  Orders Placed This Encounter  Procedures   CT CHEST ABDOMEN PELVIS W CONTRAST    Standing Status:   Future    Standing Expiration Date:   01/27/2023    Order Specific Question:   Preferred imaging location?    Answer:   [facility]    Order Specific Question:   Radiology Contrast Protocol - do NOT remove file path    Answer:   [email or web address]    Order Specific Question:   Is patient pregnant?    Answer:   No   Multiple Myeloma Panel (SPEP&IFE w/QIG)    Standing Status:   Future    Standing Expiration Date:   01/27/2023   Kappa/lambda light chains    Standing Status:   Future    Standing Expiration Date:   01/27/2023   VITAMIN D 25 Hydroxy (Vit-D Deficiency, Fractures)    Standing Status:   Future    Standing Expiration Date:   01/27/2023    All questions were answered. The patient knows to  call the clinic with any problems, questions or concerns. The total time spent in the appointment was 30 minutes encounter with patients including review of chart and various tests results, discussions about plan of care and coordination of care plan   Heath Lark, MD 01/26/2022 10:48 AM  INTERVAL HISTORY: Please see below for problem oriented charting. [name] returns for treatment follow-up for history of cold agglutinin disease and abnormal SPEP, splenomegaly [name] is doing well [name] denies recent vaping No recent bleeding complications from anticoagulation therapy  REVIEW OF SYSTEMS:   Constitutional: Denies fevers, chills or abnormal weight loss Eyes: Denies blurriness of vision Ears, nose, mouth, throat, and face: Denies mucositis or sore throat Respiratory: Denies cough, dyspnea or wheezes Cardiovascular: Denies palpitation, chest discomfort or lower extremity swelling Gastrointestinal:  Denies nausea, heartburn or change in bowel habits Skin: Denies abnormal skin rashes Lymphatics: Denies new lymphadenopathy or easy bruising Neurological:Denies numbness, tingling or new weaknesses Behavioral/Psych: Mood is stable, no new changes  All other systems were reviewed with the patient and are negative.  I have reviewed the past medical history, past surgical history, social history and family history with the patient and they are unchanged from previous note.  ALLERGIES:  is allergic to rituximab.  MEDICATIONS:  Current Outpatient Medications  Medication Sig Dispense Refill   ALPRAZolam (XANAX) 1 MG tablet TAKE 1 TABLET BY MOUTH THREE TIMES A DAY AS NEEDED FOR ANXIETY 90  tablet 1   busPIRone (BUSPAR) 30 MG tablet TAKE 1 TABLET BY MOUTH 2 TIMES DAILY. 809 tablet 0   folic acid (FOLVITE) 1 MG tablet Take 1 mg by mouth daily.     lamoTRIgine (LAMICTAL) 150 MG tablet TAKE 1 TABLET BY MOUTH EVERY DAY 30 tablet 3   omeprazole (PRILOSEC) 40 MG capsule Take 1 capsule (40 mg total) by mouth  daily. 30 capsule 3   risperidone (RISPERDAL) 4 MG tablet TAKE 1 TABLET DAILY 90 tablet 3   rivaroxaban (XARELTO) 20 MG TABS tablet Take 1 tablet (20 mg total) by mouth daily with supper. 30 tablet 11   vitamin B-12 (CYANOCOBALAMIN) 1000 MCG tablet Take 1,000 mcg by mouth daily.     No current facility-administered medications for this visit.    SUMMARY OF ONCOLOGIC HISTORY: [name] 46 y.o. 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  [name] also have history of DVT.  The patient currently uses tobacco products with vaping on a regular basis.  [name] has recent weakness and dizziness.  [name] had extensive work-up in January and recently.  [name] was found to have elevated homocystine level, borderline B12 deficiency, folate deficiency and vitamin D deficiency.  [name] has not been taking these vitamin replacement products  [name] was found to have abnormal CBC from recent blood work Her most recent CBC showed hemoglobin of 7.9 Reticulocyte count is inappropriately low for the degree of anemia [name] was also noticing passage of dark urine [name] denies recent chest pain on exertion, shortness of breath on minimal exertion, pre-syncopal episodes, or palpitations. [name] had not noticed any recent bleeding such as epistaxis, hematuria or hematochezia The patient denies over the counter NSAID ingestion. [name] is not on antiplatelets agents.  [name] had no prior history or diagnosis of cancer. Her age appropriate screening programs are up-to-date. [name] denies any pica and eats a variety of diet. [name] never donated blood or received blood transfusion CT  imaging on October 14, 2021 showed evidence of DVT and PE, mild splenomegaly but no evidence of lymphoma On July 10, [name] was started on rituximab On November 14, 2021, [name] received last dose of rituximab and 1 unit of blood   PHYSICAL EXAMINATION: ECOG PERFORMANCE STATUS: 1 - Symptomatic but completely ambulatory  Vitals:   01/26/22 1014  BP: 134/89  Pulse: 77  Resp: 18  Temp: 98.4 F (36.9 C)  SpO2: 99%   Filed Weights   01/26/22 1014  Weight: 206 lb 6.4 oz (93.6 kg)    GENERAL:alert, no distress and comfortable NEURO: alert & oriented x 3 with fluent speech, no focal motor/sensory deficits  LABORATORY DATA:  I have reviewed the data as listed    Component Value Date/Time   NA 138 01/26/2022 0951   K 3.8 01/26/2022 0951   CL 107 01/26/2022 0951   CO2 28 01/26/2022 0951   GLUCOSE 92 01/26/2022 0951   BUN 13 01/26/2022 0951   CREATININE 0.84 01/26/2022 0951   CALCIUM 10.3 01/26/2022 0951   CALCIUM 10.4 (H) 04/22/2021 0119   PROT 7.3 01/26/2022 0951   ALBUMIN 4.4 01/26/2022 0951   AST 19  01/26/2022 0951   ALT 19 01/26/2022 0951   ALKPHOS 89 01/26/2022 0951   BILITOT 0.6 01/26/2022 0951   GFRNONAA >60 01/26/2022 0951   GFRAA >90 12/27/2013 1827    No results found for: "SPEP", "UPEP"  Lab Results  Component Value Date   WBC 4.4 01/26/2022   NEUTROABS 3.1 01/26/2022   HGB 14.7 01/26/2022   HCT 40.9 01/26/2022   MCV 89.5 01/26/2022   PLT 223 01/26/2022      Chemistry      Component Value Date/Time   NA 138 01/26/2022 0951   K 3.8 01/26/2022 0951   CL 107 01/26/2022 0951   CO2 28 01/26/2022 0951   BUN 13 01/26/2022 0951   CREATININE 0.84 01/26/2022 0951      Component Value Date/Time   CALCIUM 10.3 01/26/2022 0951   CALCIUM 10.4 (H) 04/22/2021 0119   ALKPHOS 89 01/26/2022 0951   AST 19 01/26/2022 0951   ALT 19 01/26/2022 0951   BILITOT 0.6 01/26/2022 0951

## 2022-01-26 NOTE — Assessment & Plan Note (Signed)
She has no recent bleeding complications from anticoagulation therapy I plan to repeat imaging study in 6 months If she has no evidence of residual clot on CT imaging, we will stop her anticoagulation therapy I recommend the patient not to vape in the future as smoking will increase her risk of blood clot

## 2022-01-26 NOTE — Assessment & Plan Note (Signed)
She will continue high-dose replacement therapy

## 2022-01-26 NOTE — Assessment & Plan Note (Signed)
She has history of vitamin D deficiency, discontinued due to high calcium Her calcium is back to normal She will resume vitamin D

## 2022-01-26 NOTE — Assessment & Plan Note (Signed)
She has completed 4 cycles of weekly rituximab Her blood count remains normal She had splenomegaly and blood clot noted on her previous CT imaging in June I plan to repeat CT imaging in 6 months She will continue mineral replacement therapy with high-dose folic acid and M99 supplement for now

## 2022-01-26 NOTE — Telephone Encounter (Signed)
-----   Message from Heath Lark, MD sent at 01/26/2022 10:46 AM EDT ----- Her calcium is back to normal She can resume vitamin D

## 2022-01-26 NOTE — Telephone Encounter (Signed)
Patient made aware of recent lab results. Patient advised of the following note from Dr. Alvy Bimler. Patient verbalized an understanding of the information and was appreciative of the call.

## 2022-02-15 ENCOUNTER — Other Ambulatory Visit: Payer: Self-pay | Admitting: Physician Assistant

## 2022-02-17 ENCOUNTER — Other Ambulatory Visit: Payer: Self-pay | Admitting: Psychiatry

## 2022-02-23 ENCOUNTER — Ambulatory Visit (INDEPENDENT_AMBULATORY_CARE_PROVIDER_SITE_OTHER): Payer: BC Managed Care – PPO | Admitting: Physician Assistant

## 2022-02-23 ENCOUNTER — Encounter: Payer: Self-pay | Admitting: Physician Assistant

## 2022-02-23 DIAGNOSIS — R69 Illness, unspecified: Secondary | ICD-10-CM

## 2022-02-23 DIAGNOSIS — F319 Bipolar disorder, unspecified: Secondary | ICD-10-CM

## 2022-02-23 DIAGNOSIS — F4323 Adjustment disorder with mixed anxiety and depressed mood: Secondary | ICD-10-CM

## 2022-02-23 DIAGNOSIS — G35 Multiple sclerosis: Secondary | ICD-10-CM | POA: Diagnosis not present

## 2022-02-23 MED ORDER — BUSPIRONE HCL 30 MG PO TABS: 30.0000 mg | ORAL_TABLET | Freq: Every day | ORAL | 1 refills | Status: DC

## 2022-02-23 MED ORDER — ALPRAZOLAM 1 MG PO TABS: ORAL_TABLET | ORAL | 5 refills | Status: DC

## 2022-02-23 MED ORDER — LAMOTRIGINE 150 MG PO TABS: 300.0000 mg | ORAL_TABLET | Freq: Every day | ORAL | 1 refills | Status: DC

## 2022-02-23 MED ORDER — RISPERIDONE 4 MG PO TABS: 4.0000 mg | ORAL_TABLET | Freq: Every day | ORAL | 1 refills | Status: DC

## 2022-02-23 NOTE — Progress Notes (Signed)
[facility] Med Check  Patient ID: [name],  MRN: [id number]  PCP: [name], MD  Date of Evaluation: 02/23/2022 Time spent:30 minutes  Chief Complaint:  Chief Complaint   Anxiety; Depression; Follow-up    HISTORY/CURRENT STATUS: For routine med check.  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 Needs a full time job w/ benefits. "Other than that, everything is fine."  No anhedonia. Energy and motivation are good.  No extreme sadness, tearfulness, or feelings of hopelessness.  Sleeps well most of the time. ADLs and personal hygiene are normal.   Denies any changes in concentration, making decisions, or remembering things.  Appetite has not changed.  Weight is stable. Still gets very anxious at times, panicky if she doesn't take the Xanax. Has generalized anxiety on a daily basis though, can't imagine how bad it would be without the xanax.  Denies suicidal or homicidal thoughts.  Patient denies increased energy with decreased need for sleep, increased talkativeness, racing thoughts, impulsivity or risky behaviors, increased spending, increased libido, grandiosity, increased irritability or anger, paranoia, or hallucinations.  Denies dizziness, syncope, seizures, numbness, tingling, tremor, tics, unsteady gait, slurred speech, confusion. Denies muscle or joint pain, stiffness, or dystonia. Denies unexplained weight loss, frequent infections, or sores that heal slowly.  No polyphagia, polydipsia, or polyuria. Denies visual changes or paresthesias.   Individual Medical History/ Review of Systems: Changes? :Yes    had PE, severe anemia requiring transfusion. MS is stable, no flare since winter  Past medications for mental health diagnoses include: Lithium, Wellbutrin was not effective for depression or decreased desire for nicotine, Risperdal, Lamictal, Ativan, Chantix caused paranoia and increased anger  Allergies: Rituximab  Current Medications:  Current Outpatient  Medications:    folic acid (FOLVITE) 1 MG tablet, Take 1 mg by mouth daily., Disp: , Rfl:    omeprazole (PRILOSEC) 40 MG capsule, Take 1 capsule (40 mg total) by mouth daily., Disp: 30 capsule, Rfl: 3   rivaroxaban (XARELTO) 20 MG TABS tablet, Take 1 tablet (20 mg total) by mouth daily with supper., Disp: 30 tablet, Rfl: 11   vitamin B-12 (CYANOCOBALAMIN) 1000 MCG tablet, Take 1,000 mcg by mouth daily., Disp: , Rfl:    ALPRAZolam (XANAX) 1 MG tablet, TAKE 1 TABLET BY MOUTH THREE TIMES A DAY AS NEEDED FOR ANXIETY, Disp: 90 tablet, Rfl: 5   busPIRone (BUSPAR) 30 MG tablet, Take 1 tablet (30 mg total) by mouth daily., Disp: 90 tablet, Rfl: 1   lamoTRIgine (LAMICTAL) 150 MG tablet, Take 2 tablets (300 mg total) by mouth at bedtime., Disp: 180 tablet, Rfl: 1   risperidone (RISPERDAL) 4 MG tablet, Take 1 tablet (4 mg total) by mouth daily., Disp: 90 tablet, Rfl: 1 Medication Side Effects: none  Family Medical/ Social History: Changes?  no  MENTAL HEALTH EXAM:  There were no vitals taken for this visit.There is no height or weight on file to calculate BMI.  General Appearance: Casual and Well Groomed  Eye Contact:  Good  Speech:  Clear and Coherent and Normal Rate  Volume:  Normal  Mood:  Euthymic  Affect:  Congruent  Thought Process:  Goal Directed and Descriptions of Associations: Circumstantial  Orientation:  Full (Time, Place, and Person)  Thought Content: Logical   Suicidal Thoughts:  No  Homicidal Thoughts:  No  Memory:  WNL  Judgement:  Good  Insight:  Good  Psychomotor Activity:  Normal  Concentration:  Concentration: Good and Attention Span: Good  Recall:  Good  Fund of  Knowledge: Good  Language: Good  Assets:  Desire for Improvement  ADL's:  Intact  Cognition: WNL  Prognosis:  Good   Labs 01/26/2022 CBC w/ diff, CMP reviewed.  Has had multiple labs since 07/2021 at our last OV PCP, Hem follow labs  DIAGNOSES:    ICD-10-CM   1. Bipolar I disorder ([place])  F31.9      2. Situational mixed anxiety and depressive disorder  F43.23     3. Multiple sclerosis ([place])  G35     4. Chronic illness  R69      Receiving Psychotherapy: No   RECOMMENDATIONS:  PDMP was reviewed.  Last Xanax 02/18/2022.  I provided 30 minutes of face to face time during this encounter, including time spent before and after the visit in records review, medical decision making, counseling pertinent to today's visit, and charting.   From a mental health medication standpoint, she is doing well so no change will be made.   Continue Xanax 1 mg to 1 p.o. 3 times daily as needed. Continue BuSpar 30 mg, 1 p.o. twice daily. Continue Lamictal 150 mg twice daily. Continue Risperdal 4 mg nightly. Return in 6 months.  Donnal Moat, PA-C

## 2022-03-10 IMAGING — US US THYROID
1 series · 13 of 25 positions shown · non-contrast
Comparison: 04/19/2021

CLINICAL DATA: Left inferior thyroid nodule by CTA neck 04/19/2021

EXAM:
THYROID ULTRASOUND
TECHNIQUE: Ultrasound examination of the thyroid gland and adjacent soft
tissues was performed.

[Series 1: us thyroid · 58 acquisitions, 13 frames shown]
[im 1/58]
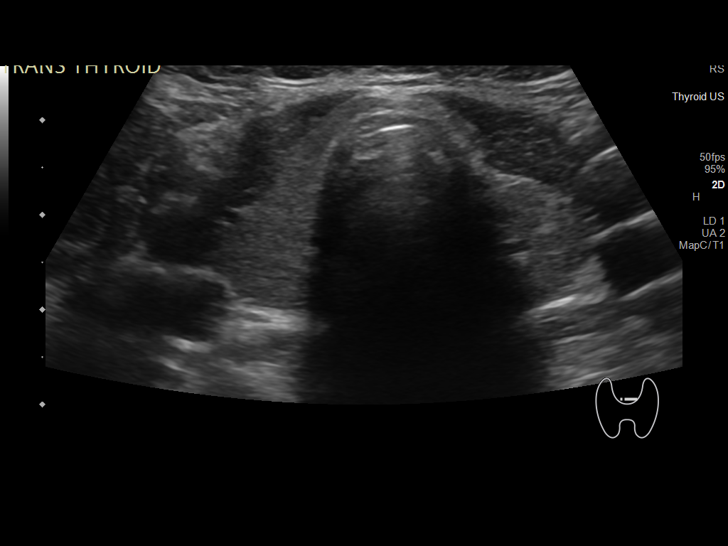
[im 5/58]
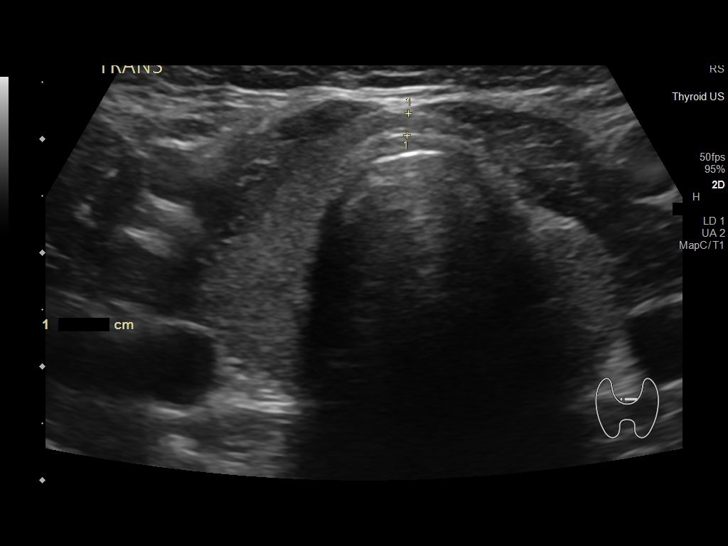
[im 10/58]
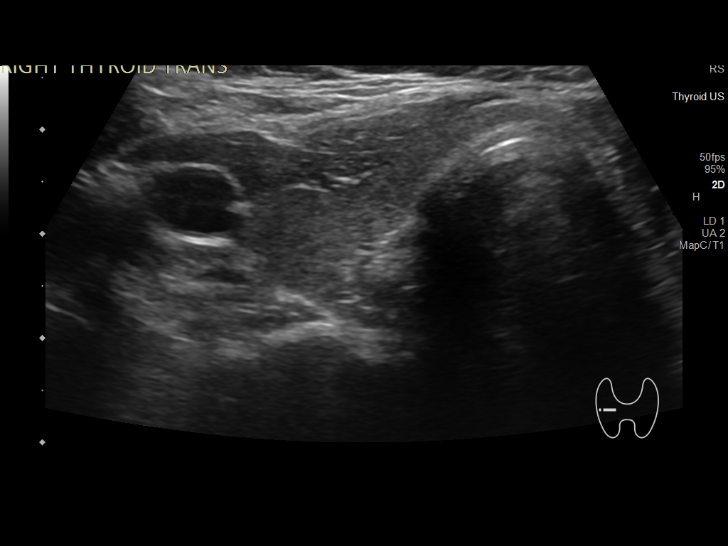
[im 15/58]
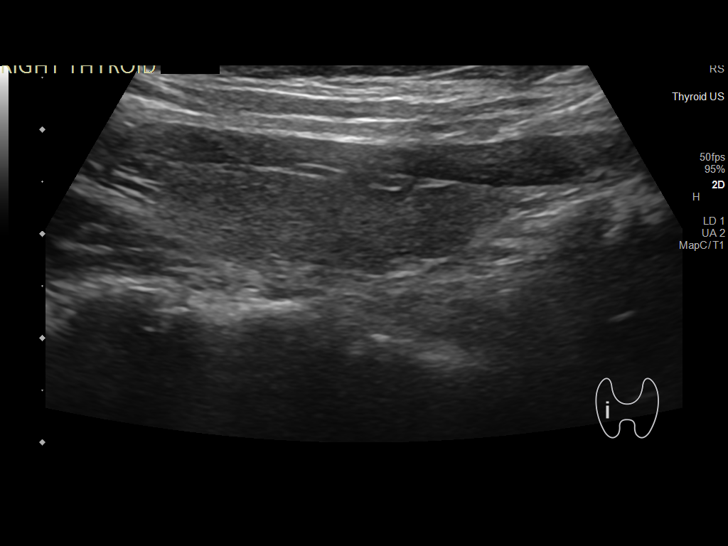
[im 20/58]
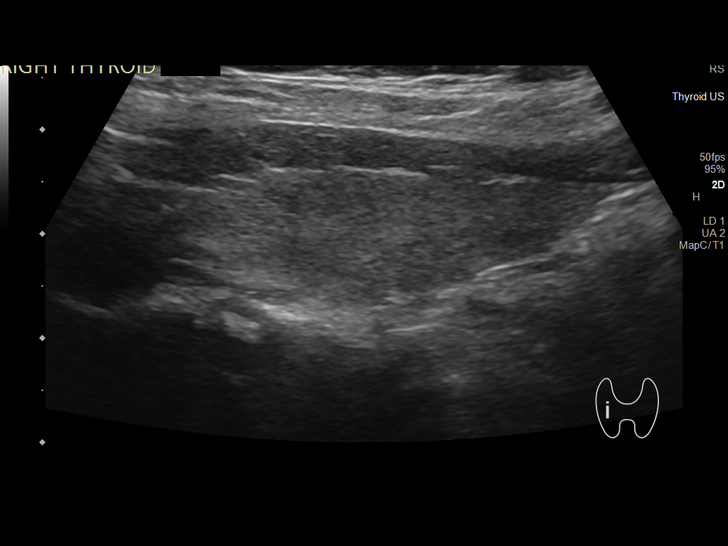
[im 24/58]
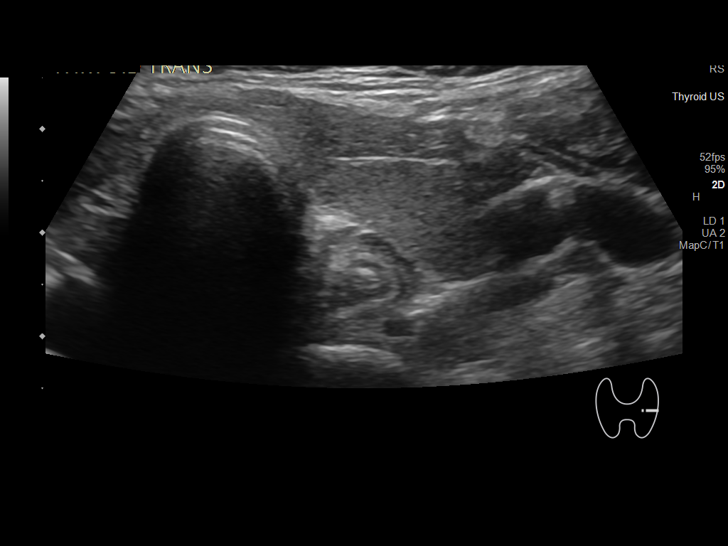
[im 29/58]
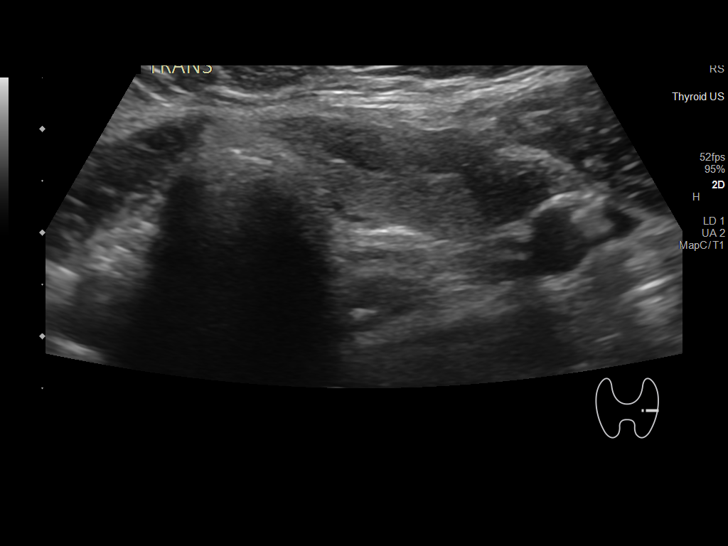
[im 34/58]
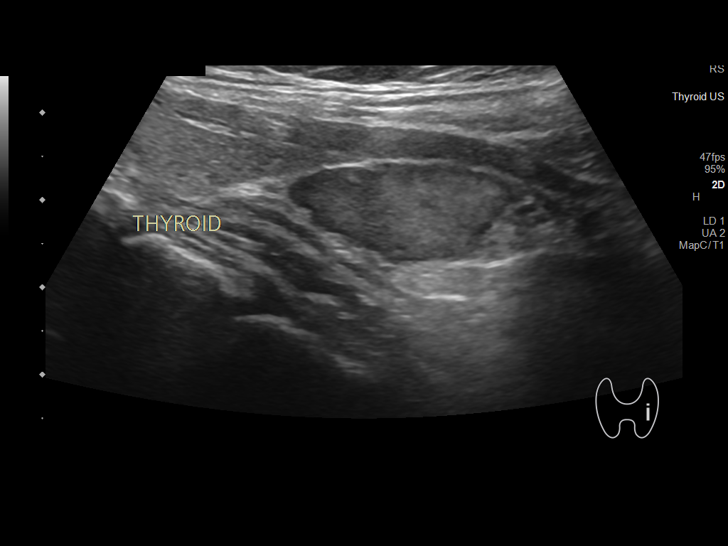
[im 39/58]
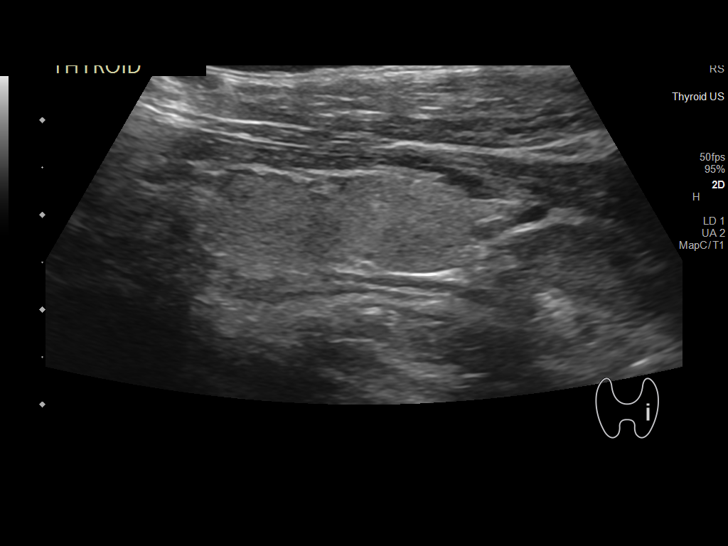
[im 43/58]
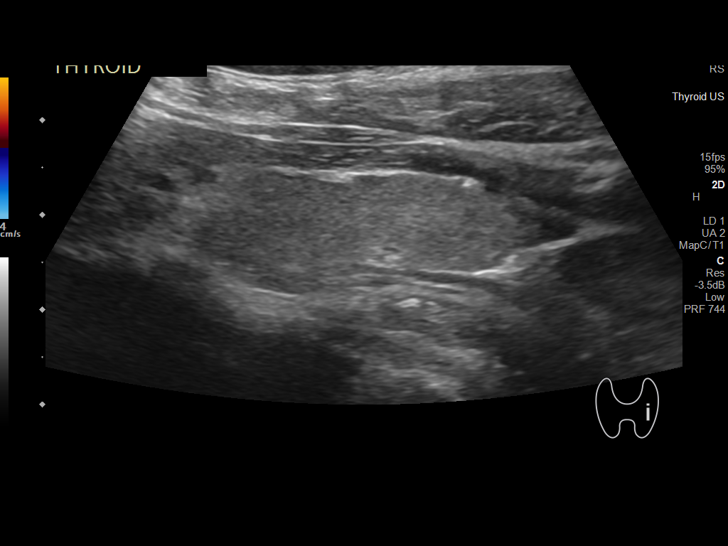
[im 48/58]
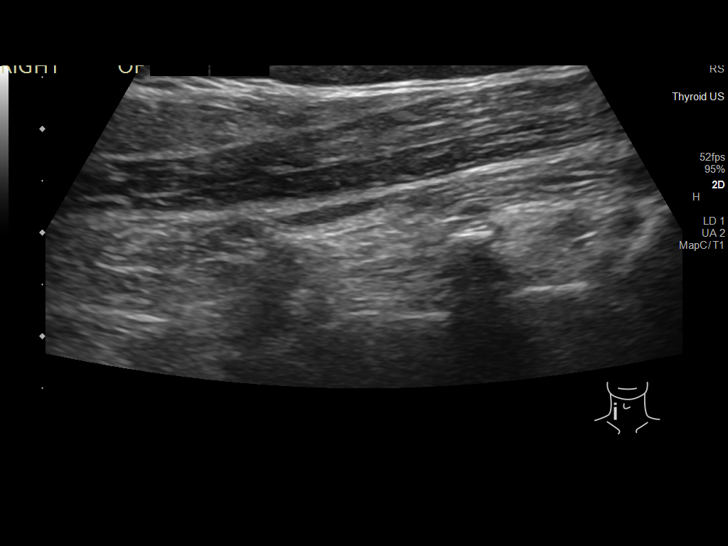
[im 53/58]
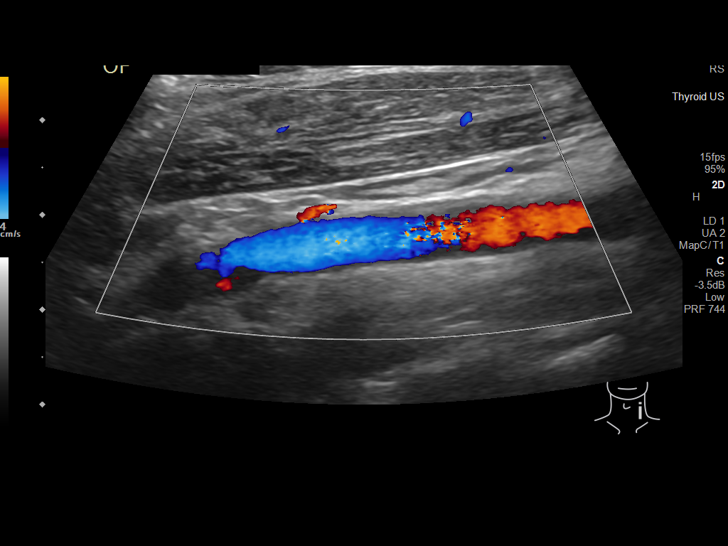
[im 58/58]
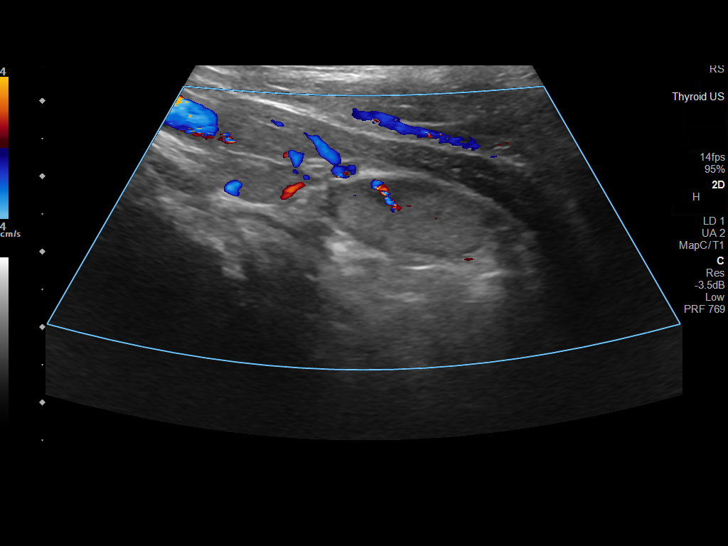

[13 of 25 positions shown; findings below may reference images not displayed]

FINDINGS: Parenchymal Echotexture: Mildly heterogenous

Isthmus: 2 mm

Right lobe: 4.5 x 1.3 x 1.7 cm

Left lobe: 3.7 x 1.1 x 1.5 cm

_________________________________________________________

Estimated total number of nodules >/= 1 cm: 0

Number of spongiform nodules >/=  2 cm not described below (TR1): 0

Number of mixed cystic and solid nodules >/= 1.5 cm not described
below (TR2): 0

_________________________________________________________

No significant thyroid abnormality appreciated. No hypervascularity.

Inferior and appearing separate from the left inferior thyroid lobe,
there is a solid mildly heterogeneous hypoechoic nodule measuring
2.7 x 1.3 x 1.5 cm. This correlates with the neck CTA finding. In
this location, an enlarged parathyroid gland or parathyroid adenoma
would be favored. Another consideration would be enlarged lymph
node.
IMPRESSION: No significant thyroid abnormality.

2.7 cm hypoechoic nodule separate and inferior to the left inferior
thyroid lobe, consider enlarged parathyroid gland or parathyroid
adenoma. Correlate with parathyroid hormone level and calcium level.
If there is concern for hyperparathyroidism, consider further
evaluation with a nuclear medicine parathyroid scan.

The above is in keeping with the ACR TI-RADS recommendations - [HOSPITAL] 1355;[DATE].

## 2022-03-16 ENCOUNTER — Other Ambulatory Visit: Payer: Self-pay | Admitting: Hematology and Oncology

## 2022-03-22 NOTE — Patient Instructions (Signed)
Below is our plan:  We will continue to monitor. Please let me know as soon as you get your new insurance information and we will start [employer] paperwork.   Please make sure you are staying well hydrated. I recommend 50-60 ounces daily. Well balanced diet and regular exercise encouraged. Consistent sleep schedule with 6-8 hours recommended.   Please continue follow up with care team as directed.   Follow up with Dr Epimenio Foot in 6 months   You may receive a survey regarding today's visit. I encourage you to leave honest feed back as I do use this information to improve patient care. Thank you for seeing me today!
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
## 2022-03-23 ENCOUNTER — Encounter: Payer: Self-pay | Admitting: Family Medicine

## 2022-03-23 ENCOUNTER — Ambulatory Visit: Payer: BC Managed Care – PPO | Admitting: Family Medicine

## 2022-03-23 VITALS — BP 145/85 | HR 77 | Ht 66.0 in | Wt 198.0 lb

## 2022-03-23 DIAGNOSIS — Z79899 Other long term (current) drug therapy: Secondary | ICD-10-CM | POA: Diagnosis not present

## 2022-03-23 DIAGNOSIS — G35 Multiple sclerosis: Secondary | ICD-10-CM

## 2022-03-23 DIAGNOSIS — F319 Bipolar disorder, unspecified: Secondary | ICD-10-CM | POA: Diagnosis not present

## 2022-03-23 DIAGNOSIS — R768 Other specified abnormal immunological findings in serum: Secondary | ICD-10-CM

## 2022-03-23 DIAGNOSIS — R269 Unspecified abnormalities of gait and mobility: Secondary | ICD-10-CM

## 2022-04-05 ENCOUNTER — Telehealth: Payer: Self-pay | Admitting: *Deleted

## 2022-04-05 NOTE — Telephone Encounter (Addendum)
Pt came by office today and signed Ocrevus start form. I faxed complete/signed Ocrevus start form to [employer] at [phone]. Received fax confirmation. Gave completed start form to [facility] for them to process. Included office visit notes,signed order for [facility], labs, MRI. Sent copy of start form to be scanned to epic.

## 2022-04-19 ENCOUNTER — Inpatient Hospital Stay: Payer: BC Managed Care – PPO | Attending: Hematology and Oncology

## 2022-04-19 ENCOUNTER — Encounter (HOSPITAL_COMMUNITY): Payer: Self-pay

## 2022-04-19 ENCOUNTER — Other Ambulatory Visit: Payer: Self-pay

## 2022-04-19 ENCOUNTER — Ambulatory Visit (HOSPITAL_COMMUNITY)
Admission: RE | Admit: 2022-04-19 | Discharge: 2022-04-19 | Disposition: A | Discharge status: Home / Self Care | Payer: BC Managed Care – PPO | Source: Ambulatory Visit | Attending: Hematology and Oncology | Admitting: Hematology and Oncology

## 2022-04-19 DIAGNOSIS — R911 Solitary pulmonary nodule: Secondary | ICD-10-CM | POA: Diagnosis not present

## 2022-04-19 DIAGNOSIS — R161 Splenomegaly, not elsewhere classified: Secondary | ICD-10-CM | POA: Insufficient documentation

## 2022-04-19 DIAGNOSIS — D598 Other acquired hemolytic anemias: Secondary | ICD-10-CM | POA: Insufficient documentation

## 2022-04-19 DIAGNOSIS — E213 Hyperparathyroidism, unspecified: Secondary | ICD-10-CM | POA: Insufficient documentation

## 2022-04-19 DIAGNOSIS — Z7901 Long term (current) use of anticoagulants: Secondary | ICD-10-CM | POA: Insufficient documentation

## 2022-04-19 DIAGNOSIS — F17200 Nicotine dependence, unspecified, uncomplicated: Secondary | ICD-10-CM | POA: Insufficient documentation

## 2022-04-19 DIAGNOSIS — I700 Atherosclerosis of aorta: Secondary | ICD-10-CM | POA: Diagnosis not present

## 2022-04-19 DIAGNOSIS — K8020 Calculus of gallbladder without cholecystitis without obstruction: Secondary | ICD-10-CM | POA: Insufficient documentation

## 2022-04-19 DIAGNOSIS — D5912 Cold autoimmune hemolytic anemia: Secondary | ICD-10-CM | POA: Insufficient documentation

## 2022-04-19 DIAGNOSIS — J90 Pleural effusion, not elsewhere classified: Secondary | ICD-10-CM | POA: Diagnosis not present

## 2022-04-19 DIAGNOSIS — N200 Calculus of kidney: Secondary | ICD-10-CM | POA: Diagnosis not present

## 2022-04-19 DIAGNOSIS — I2699 Other pulmonary embolism without acute cor pulmonale: Secondary | ICD-10-CM | POA: Diagnosis not present

## 2022-04-19 DIAGNOSIS — K5730 Diverticulosis of large intestine without perforation or abscess without bleeding: Secondary | ICD-10-CM | POA: Diagnosis not present

## 2022-04-19 DIAGNOSIS — Z79899 Other long term (current) drug therapy: Secondary | ICD-10-CM | POA: Diagnosis not present

## 2022-04-19 DIAGNOSIS — D472 Monoclonal gammopathy: Secondary | ICD-10-CM

## 2022-04-19 LAB — CBC WITH DIFFERENTIAL (CANCER CENTER ONLY)
Abs Immature Granulocytes: 0.02 10*3/uL (ref 0.00–0.07)
Basophils Absolute: 0.1 10*3/uL (ref 0.0–0.1)
Basophils Relative: 1 %
Eosinophils Absolute: 0.2 10*3/uL (ref 0.0–0.5)
Eosinophils Relative: 3 %
HCT: 40.9 % (ref 36.0–46.0)
Hemoglobin: 14.7 g/dL (ref 12.0–15.0)
Immature Granulocytes: 0 %
Lymphocytes Relative: 14 %
Lymphs Abs: 0.9 10*3/uL (ref 0.7–4.0)
MCH: 32 pg (ref 26.0–34.0)
MCHC: 35.9 g/dL (ref 30.0–36.0)
MCV: 89.1 fL (ref 80.0–100.0)
Monocytes Absolute: 0.5 10*3/uL (ref 0.1–1.0)
Monocytes Relative: 8 %
Neutro Abs: 4.6 10*3/uL (ref 1.7–7.7)
Neutrophils Relative %: 74 %
Platelet Count: 218 10*3/uL (ref 150–400)
RBC: 4.59 MIL/uL (ref 3.87–5.11)
RDW: 13 % (ref 11.5–15.5)
WBC Count: 6.2 10*3/uL (ref 4.0–10.5)
nRBC: 0 % (ref 0.0–0.2)

## 2022-04-19 LAB — CMP (CANCER CENTER ONLY)
ALT: 13 U/L (ref 0–44)
AST: 15 U/L (ref 15–41)
Albumin: 4.3 g/dL (ref 3.5–5.0)
Alkaline Phosphatase: 111 U/L (ref 38–126)
Anion gap: 6 (ref 5–15)
BUN: 13 mg/dL (ref 6–20)
CO2: 27 mmol/L (ref 22–32)
Calcium: 11 mg/dL — ABNORMAL HIGH (ref 8.9–10.3)
Chloride: 105 mmol/L (ref 98–111)
Creatinine: 0.85 mg/dL (ref 0.44–1.00)
GFR, Estimated: >60 mL/min (ref >60)
Glucose, Bld: 95 mg/dL (ref 70–99)
Potassium: 4.2 mmol/L (ref 3.5–5.1)
Sodium: 138 mmol/L (ref 135–145)
Total Bilirubin: 0.7 mg/dL (ref 0.3–1.2)
Total Protein: 7 g/dL (ref 6.5–8.1)

## 2022-04-19 LAB — VITAMIN D 25 HYDROXY (VIT D DEFICIENCY, FRACTURES): Vit D, 25-Hydroxy: 23.1 ng/mL — ABNORMAL LOW (ref 30–100)

## 2022-04-19 MED ORDER — SODIUM CHLORIDE (PF) 0.9 % IJ SOLN
INTRAMUSCULAR | Status: AC
  Filled 2022-04-19: qty 50

## 2022-04-19 MED ORDER — IOHEXOL 300 MG/ML  SOLN
100.0000 mL | Freq: Once | INTRAMUSCULAR | Status: AC | PRN
  Administered 2022-04-19: 100 mL via INTRAVENOUS

## 2022-04-20 LAB — KAPPA/LAMBDA LIGHT CHAINS
Kappa free light chain: 31.7 mg/L — ABNORMAL HIGH (ref 3.3–19.4)
Kappa, lambda light chain ratio: 1.43 (ref 0.26–1.65)
Lambda free light chains: 22.1 mg/L (ref 5.7–26.3)

## 2022-04-22 IMAGING — US US EXTREM LOW VENOUS*R*
1 series · 13 of 24 positions shown · non-contrast
Comparison: None.

CLINICAL DATA: Right calf pain; pain of right lower



[Series 1: us extrem low venous*right* · 0.08mm/px · 13 of 34 slices shown]
[im 1/34]
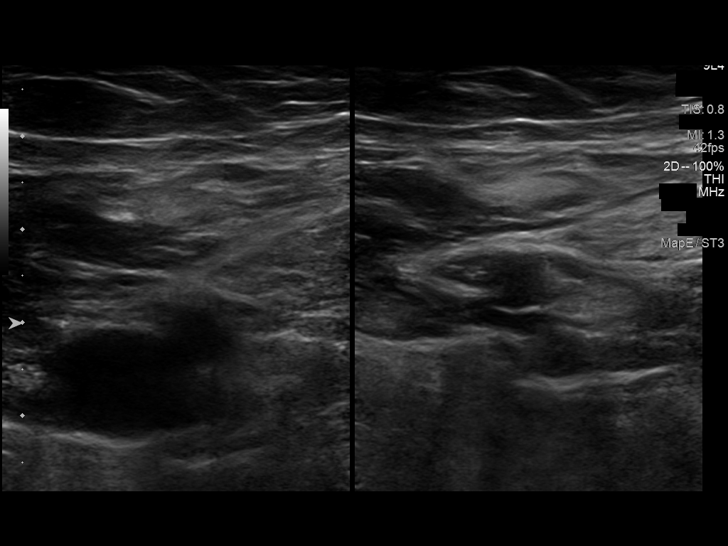
[im 3/34]
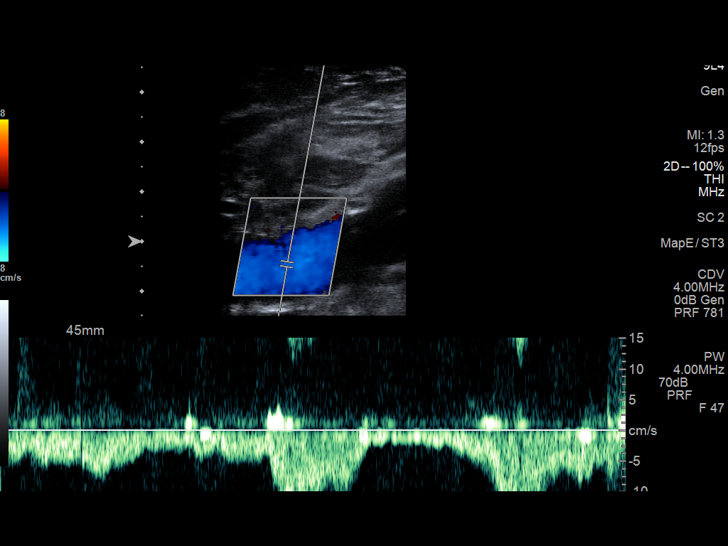
[im 6/34]
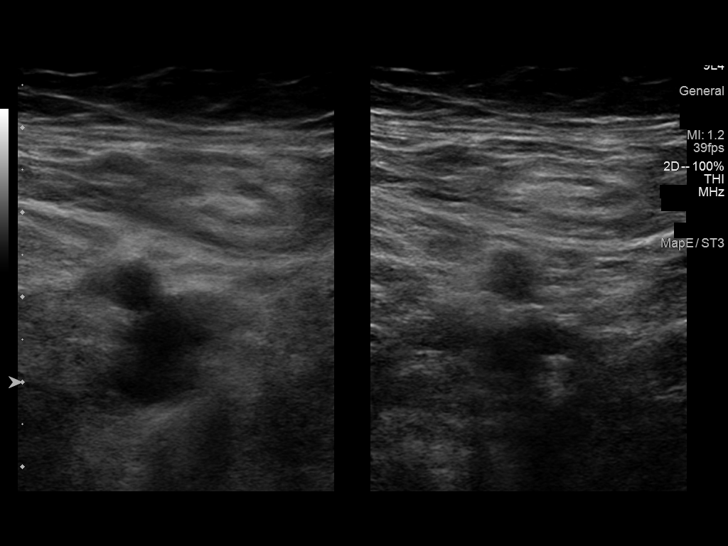
[im 9/34]
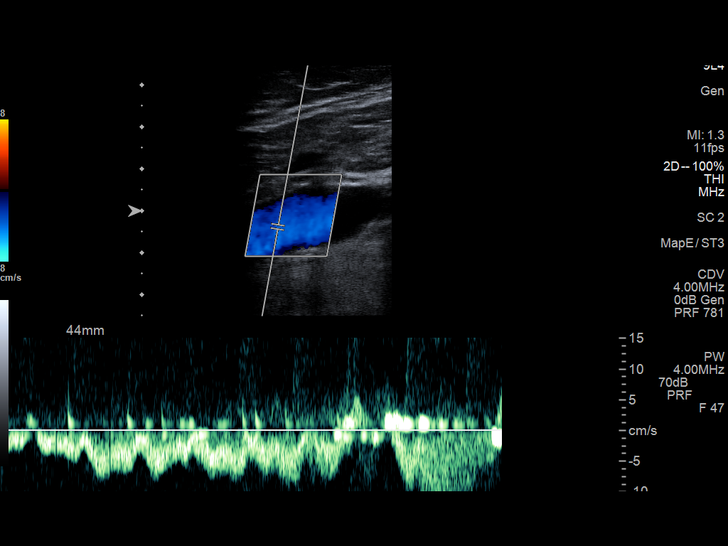
[im 12/34]
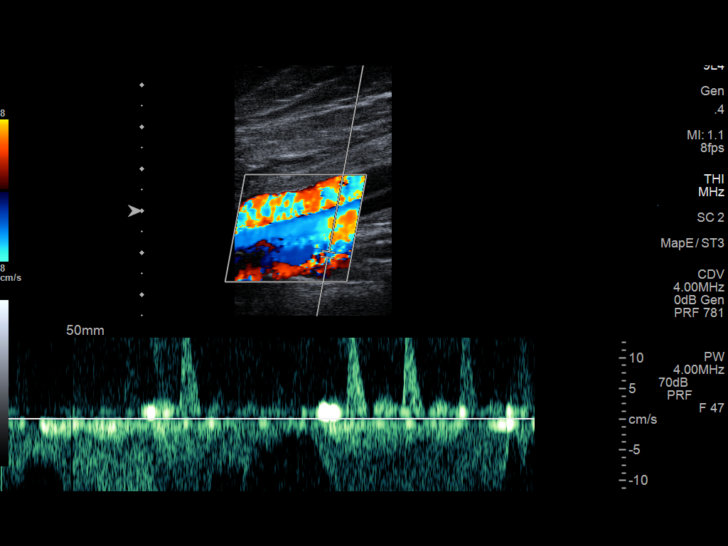
[im 15/34]
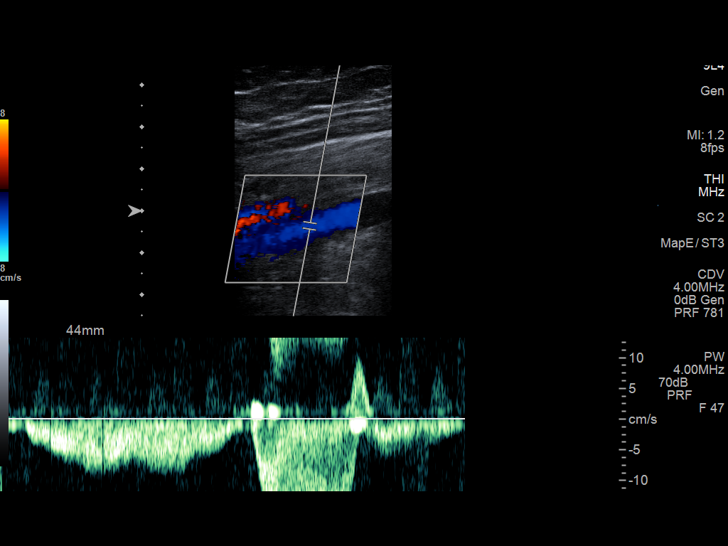
[im 18/34]
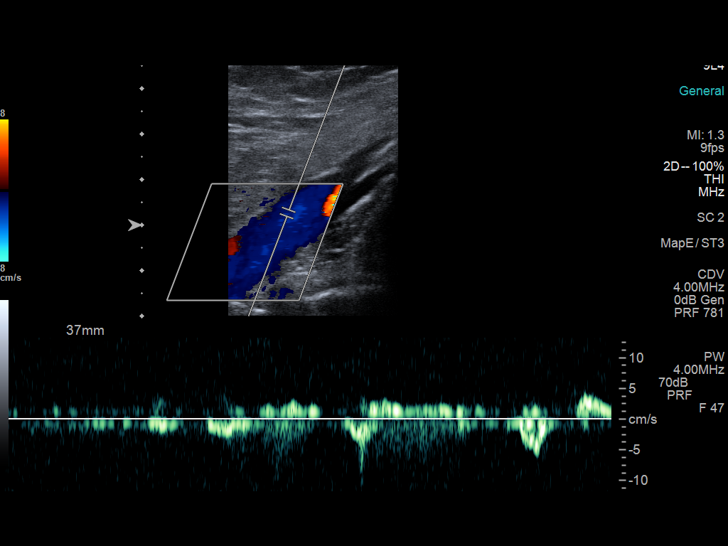
[im 19/34]
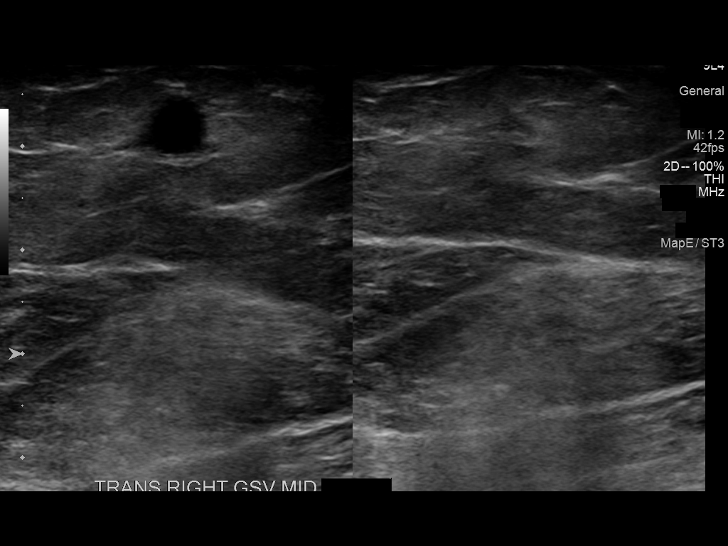
[im 22/34]
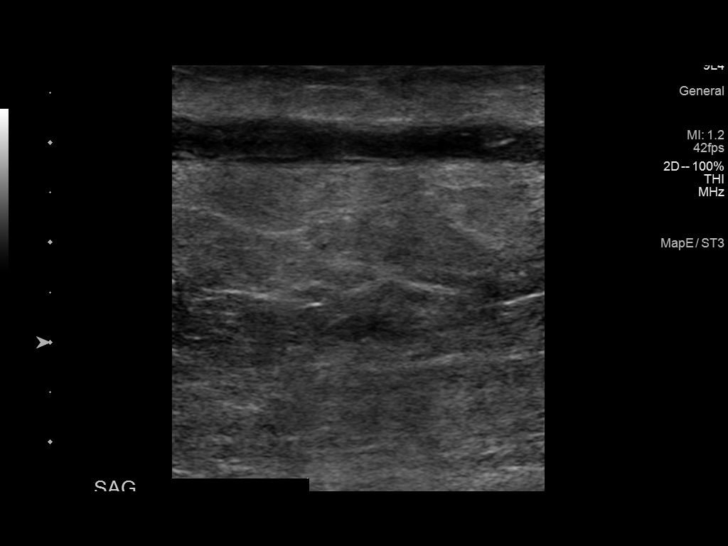
[im 25/34]
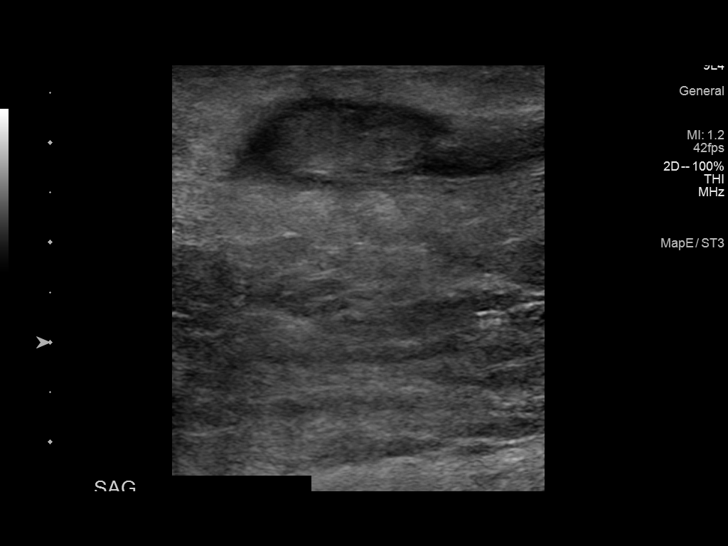
[im 28/34]
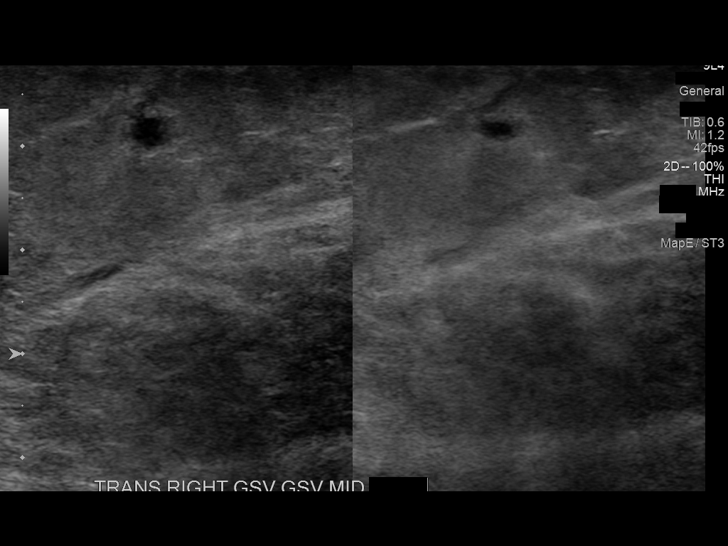
[im 31/34]
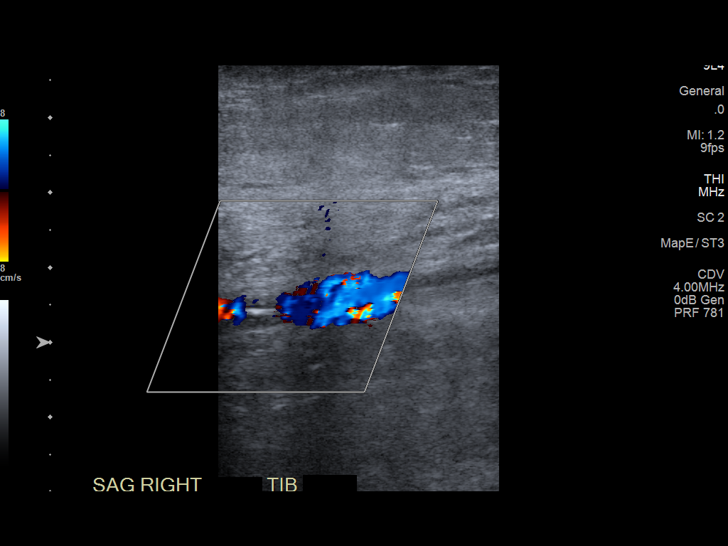
[im 34/34]
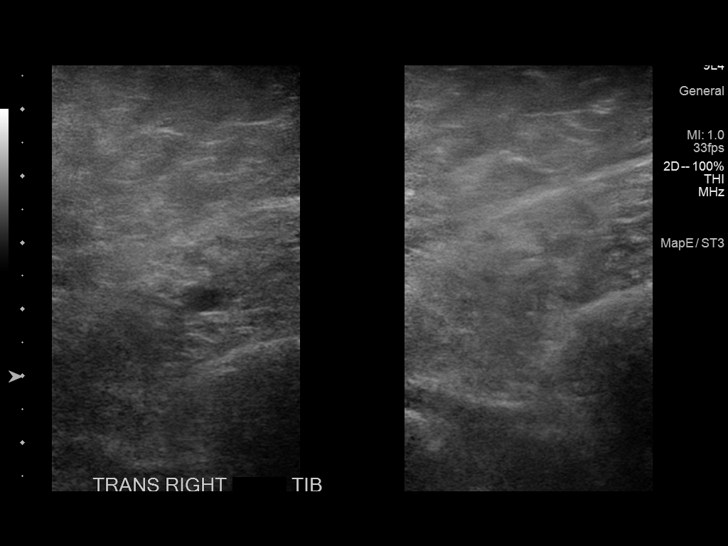

[13 of 24 positions shown; findings below may reference images not displayed]

FINDINGS: Contralateral Common Femoral Vein: Respiratory phasicity is normal
and symmetric with the symptomatic side. No evidence of thrombus.
Normal compressibility.

Common Femoral Vein: No evidence of thrombus. Normal
compressibility, respiratory phasicity and response to augmentation.

Saphenofemoral Junction: No evidence of thrombus. Normal
compressibility and flow on color Doppler imaging.

Profunda Femoral Vein: No evidence of thrombus. Normal
compressibility and flow on color Doppler imaging.

Femoral Vein: No evidence of thrombus. Normal compressibility,
respiratory phasicity and response to augmentation.

Popliteal Vein: No evidence of thrombus. Normal compressibility,
respiratory phasicity and response to augmentation.

Calf Veins: No evidence of thrombus. Normal compressibility and flow
on color Doppler imaging.

Superficial Great Saphenous Vein: No evidence of thrombus. Normal
compressibility.

Venous Reflux:  None.

Other Findings: There is venous thrombosis of the superficial right
great saphenous vein extending from the distal thigh to the proximal
calf.
IMPRESSION: Superficial venous thrombosis in the right great saphenous vein
extending from the distal thigh to the proximal calf.

No deep venous thrombosis.

These results will be called to the ordering clinician or
representative by the Radiologist Assistant, and communication
documented in the PACS or [REDACTED].

## 2022-04-24 LAB — MULTIPLE MYELOMA PANEL, SERUM
Albumin SerPl Elph-Mcnc: 4 g/dL (ref 2.9–4.4)
Albumin/Glob SerPl: 1.3 (ref 0.7–1.7)
Alpha 1: 0.3 g/dL (ref 0.0–0.4)
Alpha2 Glob SerPl Elph-Mcnc: 0.5 g/dL (ref 0.4–1.0)
B-Globulin SerPl Elph-Mcnc: 1 g/dL (ref 0.7–1.3)
Gamma Glob SerPl Elph-Mcnc: 1.3 g/dL (ref 0.4–1.8)
Globulin, Total: 3.1 g/dL (ref 2.2–3.9)
IgA: 279 mg/dL (ref 87–352)
IgG (Immunoglobin G), Serum: 1235 mg/dL (ref 586–1602)
IgM (Immunoglobulin M), Srm: 128 mg/dL (ref 26–217)
Total Protein ELP: 7.1 g/dL (ref 6.0–8.5)

## 2022-04-27 ENCOUNTER — Encounter: Payer: Self-pay | Admitting: Hematology and Oncology

## 2022-04-27 ENCOUNTER — Inpatient Hospital Stay (HOSPITAL_BASED_OUTPATIENT_CLINIC_OR_DEPARTMENT_OTHER): Payer: BC Managed Care – PPO | Admitting: Hematology and Oncology

## 2022-04-27 VITALS — BP 120/91 | HR 110 | Temp 97.9°F | Resp 18 | Ht 66.0 in | Wt 202.4 lb

## 2022-04-27 DIAGNOSIS — I2699 Other pulmonary embolism without acute cor pulmonale: Secondary | ICD-10-CM

## 2022-04-27 DIAGNOSIS — D5912 Cold autoimmune hemolytic anemia: Secondary | ICD-10-CM

## 2022-04-27 HISTORY — DX: Hypercalcemia: E83.52

## 2022-04-27 NOTE — Assessment & Plan Note (Signed)
She has intermittent hypercalcemia and was known to have abnormal hyperparathyroidism I will defer to her primary care doctor for management

## 2022-04-27 NOTE — Assessment & Plan Note (Addendum)
This has resolved We will monitor closely The patient denies further vaping, which I believe could be a trigger Her splenomegaly is resolving

## 2022-04-27 NOTE — Assessment & Plan Note (Addendum)
She has completed 6 months of anticoagulation therapy She can discontinue Xarelto after her current prescription runs out, provided the patient will stop smoking permanently However, due to history of smoking, I recommend she takes 81 mg aspirin for further prevention

## 2022-04-27 NOTE — Progress Notes (Signed)
[place] OFFICE PROGRESS NOTE  Patient Care Team: [name], MD as PCP - General (Family Medicine) Mohammed Kindle (Inactive)  ASSESSMENT & PLAN:  Cold agglutinin disease [facility]) This has resolved We will monitor closely The patient denies further vaping, which I believe could be a trigger Her splenomegaly is resolving  Pulmonary embolus [facility]) She has completed 6 months of anticoagulation therapy She can discontinue Xarelto after her current prescription runs out, provided the patient will stop smoking permanently However, due to history of smoking, I recommend she takes 81 mg aspirin for further prevention  Hypercalcemia She has intermittent hypercalcemia and was known to have abnormal hyperparathyroidism I will defer to her primary care doctor for management  Orders Placed This Encounter  Procedures   CBC with Differential/Platelet    Standing Status:   Standing    Number of Occurrences:   22    Standing Expiration Date:   04/28/2023   Comprehensive metabolic panel    Standing Status:   Standing    Number of Occurrences:   33    Standing Expiration Date:   04/28/2023    All questions were answered. The patient knows to call the clinic with any problems, questions or concerns. The total time spent in the appointment was 30 minutes encounter with patients including review of chart and various tests results, discussions about plan of care and coordination of care plan   Heath Lark, MD 04/27/2022 10:00 AM  INTERVAL HISTORY: Please see below for problem oriented charting. she returns for treatment follow-up  REVIEW OF SYSTEMS:   Constitutional: Denies fevers, chills or abnormal weight loss Eyes: Denies blurriness of vision Ears, nose, mouth, throat, and face: Denies mucositis or sore throat Respiratory: Denies cough, dyspnea or wheezes Cardiovascular: Denies palpitation, chest discomfort or lower extremity swelling Gastrointestinal:  Denies nausea,  heartburn or change in bowel habits Skin: Denies abnormal skin rashes Lymphatics: Denies new lymphadenopathy or easy bruising Neurological:Denies numbness, tingling or new weaknesses Behavioral/Psych: Mood is stable, no new changes  All other systems were reviewed with the patient and are negative.  I have reviewed the past medical history, past surgical history, social history and family history with the patient and they are unchanged from previous note.  ALLERGIES:  is allergic to rituximab.  MEDICATIONS:  Current Outpatient Medications  Medication Sig Dispense Refill   ALPRAZolam (XANAX) 1 MG tablet TAKE 1 TABLET BY MOUTH THREE TIMES A DAY AS NEEDED FOR ANXIETY 90 tablet 5   busPIRone (BUSPAR) 30 MG tablet Take 1 tablet (30 mg total) by mouth daily. 90 tablet 1   folic acid (FOLVITE) 1 MG tablet Take 1 mg by mouth daily.     lamoTRIgine (LAMICTAL) 150 MG tablet Take 2 tablets (300 mg total) by mouth at bedtime. 180 tablet 1   ocrelizumab (OCREVUS) 300 MG/10ML injection Inject into the vein once. Initial dose: '300mg'$  IV on day 1 and repeat day 15. Maintenance: '600mg'$  IV q 88month     omeprazole (PRILOSEC) 40 MG capsule TAKE 1 CAPSULE (40 MG TOTAL) BY MOUTH DAILY. 90 capsule 1   risperidone (RISPERDAL) 4 MG tablet Take 1 tablet (4 mg total) by mouth daily. 90 tablet 1   rivaroxaban (XARELTO) 20 MG TABS tablet Take 1 tablet (20 mg total) by mouth daily with supper. 30 tablet 11   vitamin B-12 (CYANOCOBALAMIN) 1000 MCG tablet Take 1,000 mcg by mouth daily.     No current facility-administered medications for this visit.    SUMMARY  OF ONCOLOGIC HISTORY: [name] 47 y.o. 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  She also have history of DVT.  The patient currently uses tobacco products with vaping on a regular basis.  She has recent weakness and dizziness.  She had extensive work-up in January and recently.  She was found to have elevated homocystine level, borderline B12 deficiency, folate deficiency and vitamin D deficiency.  She has not been taking these vitamin replacement products  She was found to have abnormal CBC from recent blood work Her most recent CBC showed hemoglobin of 7.9 Reticulocyte count is inappropriately low for the degree of anemia She was also noticing passage of dark urine She denies recent chest pain on exertion, shortness of breath on minimal exertion, pre-syncopal episodes, or palpitations. She had not noticed any recent bleeding such as epistaxis, hematuria or hematochezia The patient denies over the counter NSAID ingestion. She is not on antiplatelets agents.  She had no prior history or diagnosis of cancer. Her age appropriate screening programs are up-to-date. She denies any pica and eats a variety of diet. She never donated blood or received blood transfusion CT imaging on October 14, 2021 showed evidence of DVT and PE, mild splenomegaly but no evidence of lymphoma On July 10, she was started on rituximab On November 14, 2021, she received last dose of rituximab and 1 unit of blood Repeat imaging study in January 2024 showed resolution of splenomegaly and normal blood count  PHYSICAL EXAMINATION: ECOG PERFORMANCE STATUS: 1 - Symptomatic but completely ambulatory  Vitals:   04/27/22 0837  BP: (!) 120/91  Pulse: (!) 110  Resp: 18  Temp: 97.9 F (36.6 C)  SpO2: 100%   Filed Weights   04/27/22 0837  Weight: 202 lb 6.4 oz (91.8 kg)    GENERAL:alert, no distress and comfortable NEURO: alert & oriented x 3 with fluent speech, no focal motor/sensory  deficits  LABORATORY DATA:  I have reviewed the data as listed    Component Value Date/Time   NA 138 04/19/2022 0838   K 4.2 04/19/2022 0838   CL 105 04/19/2022 0838   CO2 27 04/19/2022 0838   GLUCOSE 95 04/19/2022 0838   BUN 13 04/19/2022 0838   CREATININE 0.85 04/19/2022 0838   CALCIUM 11.0 (H) 04/19/2022 0838   CALCIUM 10.4 (H) 04/22/2021 0119   PROT 7.0 04/19/2022 0838   ALBUMIN 4.3 04/19/2022 0838   AST 15 04/19/2022 0838   ALT 13 04/19/2022 0838   ALKPHOS 111 04/19/2022 0838   BILITOT 0.7 04/19/2022 0838   GFRNONAA >60 04/19/2022 0838   GFRAA >90 12/27/2013 1827    No results found for: "SPEP", "UPEP"  Lab Results  Component Value Date   WBC 6.2 04/19/2022   NEUTROABS 4.6 04/19/2022   HGB 14.7 04/19/2022   HCT 40.9 04/19/2022   MCV 89.1 04/19/2022   PLT 218 04/19/2022      Chemistry      Component Value Date/Time   NA 138 04/19/2022 0838   K 4.2 04/19/2022 0838   CL 105 04/19/2022 0838   CO2 27 04/19/2022 0838   BUN 13 04/19/2022  5885   CREATININE 0.85 04/19/2022 0838      Component Value Date/Time   CALCIUM 11.0 (H) 04/19/2022 0838   CALCIUM 10.4 (H) 04/22/2021 0119   ALKPHOS 111 04/19/2022 0838   AST 15 04/19/2022 0838   ALT 13 04/19/2022 0838   BILITOT 0.7 04/19/2022 0838       RADIOGRAPHIC STUDIES: I have reviewed multiple imaging studies with the patient I have personally reviewed the radiological images as listed and agreed with the findings in the report. CT CHEST ABDOMEN PELVIS W CONTRAST  Result Date: 04/19/2022 CLINICAL DATA:  Hematologic malignancy, splenomegaly. * Tracking Code: BO * EXAM: CT CHEST, ABDOMEN, AND PELVIS WITH CONTRAST TECHNIQUE: Multidetector CT imaging of the chest, abdomen and pelvis was performed following the standard protocol during bolus administration of intravenous contrast. RADIATION DOSE REDUCTION: This exam was performed according to the departmental dose-optimization program which includes automated exposure  control, adjustment of the mA and/or kV according to patient size and/or use of iterative reconstruction technique. CONTRAST:  174m OMNIPAQUE IOHEXOL 300 MG/ML  SOLN COMPARISON:  CT chest abdomen pelvis October 13, 2021. FINDINGS: CT CHEST FINDINGS Cardiovascular: Normal caliber thoracic aorta. No central pulmonary embolus on this nondedicated study. Normal size heart. No significant pericardial effusion/thickening. Mediastinum/Nodes: No suspicious thyroid nodule. No pathologically enlarged mediastinal, hilar or axillary lymph nodes. The esophagus is grossly unremarkable. Lungs/Pleura: Stable 3 mm right lower lobe pulmonary nodule on image 68/4. No new suspicious pulmonary nodules or masses. Stable trace right pleural effusion/thickening. Musculoskeletal: No aggressive lytic or blastic lesion of bone. CT ABDOMEN PELVIS FINDINGS Hepatobiliary: No suspicious hepatic lesion. Cholelithiasis without findings of acute cholecystitis. Similar prominence of the common bile duct measuring 10 mm in diameter. Pancreas: No pancreatic ductal dilation or evidence of acute inflammation. Spleen: Decreased size of the spleen which is now borderline enlarged measuring 12.7 cm in maximum axial dimension previously 14 cm. Adrenals/Urinary Tract: Bilateral adrenal glands appear normal. No hydronephrosis. Kidneys demonstrate symmetric enhancement. Punctate 3 mm nonobstructive left lower pole renal stone. Urinary bladder is nondistended limiting evaluation. Stomach/Bowel: Stomach is unremarkable for degree of distension. No pathologic dilation of small or large bowel. Appendix and terminal ileum appear normal. No evidence of acute bowel inflammation. Scattered left-sided colonic diverticulosis without findings of acute diverticulitis. Vascular/Lymphatic: Aortic atherosclerosis.  Smooth IVC contours. No pathologically enlarged abdominal or pelvic lymph nodes. Reproductive: Fluid in the endometrial canal with hyperenhancement of the  endometrium common nonspecific suggest correlation with patient's menstrual cycle. Physiologic dominant follicle in the bilateral ovaries measuring 19 mm on the right and 15 mm on the left, considered benign requiring no independent imaging follow-up. Other: Trace pelvic free fluid is within physiologic normal limits. Musculoskeletal: Similar heterogeneity of the marrow in the bilateral proximal femurs with areas of lucency without aggressive lesion identified. IMPRESSION: 1. Decreased size of the spleen which is now borderline enlarged. 2. No pathologically enlarged lymph nodes above or below the diaphragm. 3. Similar patchy appearance of the marrow in the proximal bilateral femurs, possibly reflecting marrow conversion consider attention on dedicated MRI if clinically indicated. 4. Stable 3 mm right lower lobe pulmonary nodule, favored benign. Continued attention on follow-up imaging suggested. 5. Dilated common bile duct is similar prior suggest correlation with laboratory values to exclude biliary obstruction and if clinical concern for obstruction further evaluation by pre and postcontrast MRCP. 6. Cholelithiasis without findings of acute cholecystitis. 7. Punctate nonobstructive left lower pole renal stone. 8. Scattered left-sided colonic diverticulosis without findings of acute diverticulitis. 9.  Aortic Atherosclerosis (ICD10-I70.0). Electronically Signed   By: Dahlia Bailiff M.D.   On: 04/19/2022 13:42

## 2022-05-16 ENCOUNTER — Encounter: Payer: Self-pay | Admitting: Family Medicine

## 2022-05-23 NOTE — Telephone Encounter (Signed)
Faxed Ocrevus appeal to [place] at [phone]. Received fax confirmation. Marked urgent, waiting on determination.

## 2022-05-24 ENCOUNTER — Telehealth: Payer: Self-pay | Admitting: Family Medicine

## 2022-05-24 NOTE — Telephone Encounter (Signed)
Received appeal denial. Gave to MD to review.

## 2022-05-24 NOTE — Telephone Encounter (Signed)
Jessica @ [facility]  called to inform of denial on expedited appeal for Ocrevus, she is faxing over letter, this is FYI no call back requested

## 2022-05-25 NOTE — Telephone Encounter (Signed)
Faxed second level appeal to [facility] with office notes/marked urgent to [phone]. Received fax confirmation, waiting on determination.

## 2022-06-16 ENCOUNTER — Other Ambulatory Visit (HOSPITAL_COMMUNITY): Payer: Self-pay

## 2022-07-13 ENCOUNTER — Encounter: Payer: Self-pay | Admitting: Neurology

## 2022-07-18 ENCOUNTER — Other Ambulatory Visit: Payer: Self-pay

## 2022-07-18 ENCOUNTER — Encounter: Payer: Self-pay | Admitting: Neurology

## 2022-07-18 ENCOUNTER — Other Ambulatory Visit: Payer: Self-pay | Admitting: Neurology

## 2022-07-18 ENCOUNTER — Telehealth: Payer: Self-pay | Admitting: Physician Assistant

## 2022-07-18 MED ORDER — BACLOFEN 10 MG PO TABS: 5.0000 mg | ORAL_TABLET | Freq: Three times a day (TID) | ORAL | 5 refills | Status: DC

## 2022-07-18 NOTE — Telephone Encounter (Signed)
Pt lvm that her doctor has put her on a new medication baclofen which is a muscle relaxer for her back. He told her that she may need to cut back on the risperidone. Pt want s to know if that is ok or can she keep on the same dose. Please call her at [phone]

## 2022-07-18 NOTE — Telephone Encounter (Signed)
Please see message and advise.  "Take 0.5-1 tablets (5-10 mg total) by mouth 3 (three) times daily."

## 2022-07-19 ENCOUNTER — Encounter: Payer: Self-pay | Admitting: Neurology

## 2022-07-19 NOTE — Telephone Encounter (Signed)
The Risperdal is only at night, it's fine to stay on the same dose. Does she mean Xanax? Using that along with Baclofen can increase drowsiness, possibly cause confusion, and can lead to falls. It's ok to still take the Xanax, but if she decrease the dose (take 1/2 pill instead of a whole 1) or decrease the frequency that she takes it, that will be best.

## 2022-07-19 NOTE — Telephone Encounter (Signed)
Notified patient of recommendations.

## 2022-08-03 DIAGNOSIS — G35 Multiple sclerosis: Secondary | ICD-10-CM | POA: Diagnosis not present

## 2022-08-24 ENCOUNTER — Encounter: Payer: Self-pay | Admitting: Neurology

## 2022-08-28 ENCOUNTER — Encounter: Payer: Self-pay | Admitting: Physician Assistant

## 2022-08-28 ENCOUNTER — Other Ambulatory Visit: Payer: Self-pay | Admitting: Physician Assistant

## 2022-08-28 ENCOUNTER — Ambulatory Visit (INDEPENDENT_AMBULATORY_CARE_PROVIDER_SITE_OTHER): Payer: BC Managed Care – PPO | Admitting: Physician Assistant

## 2022-08-28 DIAGNOSIS — R69 Illness, unspecified: Secondary | ICD-10-CM

## 2022-08-28 DIAGNOSIS — G35 Multiple sclerosis: Secondary | ICD-10-CM

## 2022-08-28 DIAGNOSIS — F411 Generalized anxiety disorder: Secondary | ICD-10-CM | POA: Diagnosis not present

## 2022-08-28 DIAGNOSIS — F319 Bipolar disorder, unspecified: Secondary | ICD-10-CM

## 2022-08-28 DIAGNOSIS — Z79899 Other long term (current) drug therapy: Secondary | ICD-10-CM

## 2022-08-28 MED ORDER — OXYBUTYNIN CHLORIDE 5 MG PO TABS: 5.0000 mg | ORAL_TABLET | Freq: Three times a day (TID) | ORAL | 1 refills | Status: DC

## 2022-08-28 MED ORDER — RISPERIDONE 4 MG PO TABS: 4.0000 mg | ORAL_TABLET | Freq: Every day | ORAL | 1 refills | Status: DC

## 2022-08-28 MED ORDER — LAMOTRIGINE 150 MG PO TABS: 300.0000 mg | ORAL_TABLET | Freq: Every day | ORAL | 1 refills | Status: DC

## 2022-08-28 MED ORDER — ALPRAZOLAM 1 MG PO TABS: ORAL_TABLET | ORAL | 5 refills | Status: DC

## 2022-08-28 NOTE — Progress Notes (Signed)
[facility]  Patient ID: [name],  MRN: [id number]  PCP: Aliene Beams, MD  Date of Evaluation: 08/28/2022 Time spent:30 minutes  Chief Complaint:  Chief Complaint   Anxiety; Depression; Follow-up    HISTORY/CURRENT STATUS: For routine med check.  Doing well. Patient is able to enjoy things.  Energy and motivation are good.  Work is going well.  Has a full-time job in Audiological scientist at [employer].  Likes it.  No extreme sadness, tearfulness, or feelings of hopelessness.  Sleeps well most of the time. ADLs and personal hygiene are normal.   Denies any changes in concentration, making decisions, or remembering things.  Appetite has not changed.  Weight is stable.  She stopped taking BuSpar because it made her dizzy.  She has not noticed any change in anxiety because of it.  Not having panic attacks.  The Xanax is helpful.  Denies suicidal or homicidal thoughts.  Patient denies increased energy with decreased need for sleep, increased talkativeness, racing thoughts, impulsivity or risky behaviors, increased spending, increased libido, grandiosity, increased irritability or anger, paranoia, or hallucinations.  Denies dizziness, syncope, seizures, numbness, tingling, tremor, tics, unsteady gait, slurred speech, confusion. Denies muscle or joint pain, stiffness, or dystonia. Denies unexplained weight loss, frequent infections, or sores that heal slowly.  No polyphagia, polydipsia, or polyuria. Denies visual changes or paresthesias.   Individual Medical History/ Review of Systems: Changes? :Yes     has had a few MS flares since the LOV. Had treatment for it last month.  Past medications for mental health diagnoses include: Lithium, Wellbutrin was not effective for depression or decreased desire for nicotine, Risperdal, Lamictal, Ativan, Chantix caused paranoia and increased anger, BuSpar made her dizzy  Allergies: Rituximab  Current Medications:  Current Outpatient  Medications:    aspirin EC 81 MG tablet, Take 81 mg by mouth daily. Swallow whole., Disp: , Rfl:    baclofen (LIORESAL) 10 MG tablet, Take 0.5-1 tablets (5-10 mg total) by mouth 3 (three) times daily., Disp: 90 each, Rfl: 5   ocrelizumab (OCREVUS) 300 MG/10ML injection, Inject into the vein once. Initial dose: 300mg  IV on day 1 and repeat day 15. Maintenance: 600mg  IV q 6months, Disp: , Rfl:    omeprazole (PRILOSEC) 40 MG capsule, TAKE 1 CAPSULE (40 MG TOTAL) BY MOUTH DAILY., Disp: 90 capsule, Rfl: 1   oxybutynin (DITROPAN) 5 MG tablet, Take 1 tablet (5 mg total) by mouth 3 (three) times daily., Disp: 270 tablet, Rfl: 1   ALPRAZolam (XANAX) 1 MG tablet, TAKE 1 TABLET BY MOUTH THREE TIMES A DAY AS NEEDED FOR ANXIETY, Disp: 90 tablet, Rfl: 5   folic acid (FOLVITE) 1 MG tablet, Take 1 mg by mouth daily. (Patient not taking: Reported on 08/28/2022), Disp: , Rfl:    lamoTRIgine (LAMICTAL) 150 MG tablet, Take 2 tablets (300 mg total) by mouth at bedtime., Disp: 180 tablet, Rfl: 1   risperidone (RISPERDAL) 4 MG tablet, Take 1 tablet (4 mg total) by mouth daily., Disp: 90 tablet, Rfl: 1   rivaroxaban (XARELTO) 20 MG TABS tablet, Take 1 tablet (20 mg total) by mouth daily with supper. (Patient not taking: Reported on 08/28/2022), Disp: 30 tablet, Rfl: 11   vitamin B-12 (CYANOCOBALAMIN) 1000 MCG tablet, Take 1,000 mcg by mouth daily. (Patient not taking: Reported on 08/28/2022), Disp: , Rfl:  Medication Side Effects: none  Family Medical/ Social History: Changes?  New job at [employer] in Audiological scientist.  MENTAL HEALTH EXAM:  There were no vitals taken  for this visit.There is no height or weight on file to calculate BMI.  General Appearance: Casual and Well Groomed  Eye Contact:  Good  Speech:  Clear and Coherent and Normal Rate  Volume:  Normal  Mood:  Euthymic  Affect:  Congruent  Thought Process:  Goal Directed and Descriptions of Associations: Circumstantial  Orientation:  Full (Time, Place, and  Person)  Thought Content: Logical   Suicidal Thoughts:  No  Homicidal Thoughts:  No  Memory:  WNL  Judgement:  Good  Insight:  Good  Psychomotor Activity:  Normal  Concentration:  Concentration: Good and Attention Span: Good  Recall:  Good  Fund of Knowledge: Good  Language: Good  Assets:  Desire for Improvement Financial Resources/Insurance Housing Transportation Vocational/Educational  ADL's:  Intact  Cognition: WNL  Prognosis:  Good   DIAGNOSES:    ICD-10-CM   1. Bipolar I disorder (HCC)  F31.9 Lipid panel    Hemoglobin A1c    2. Encounter for long-term (current) use of medications  Z79.899 Lipid panel    Hemoglobin A1c    3. Generalized anxiety disorder  F41.1     4. Chronic illness  R69     5. Multiple sclerosis (HCC)  G35      Receiving Psychotherapy: No   RECOMMENDATIONS:  PDMP was reviewed.  Last Xanax 08/16/2022. I provided 30 minutes of face to face time during this encounter, including time spent before and after the visit in records review, medical decision making, counseling pertinent to today's visit, and charting.   She is doing well as far as her mental health medications go so no changes will be made.  Continue Xanax 1 mg to 1 p.o. 3 times daily as needed. Continue Lamictal 150 mg twice daily. Continue Risperdal 4 mg nightly. Labs ordered as above. Return in 6 months.  Melony Overly, PA-C

## 2022-08-28 NOTE — Telephone Encounter (Addendum)
Called [facility] at [phone] and spoke w/ Ponciano Ort. She asked that there be a stop placed on refill so it will not be filled. She transferred me to pharmacist, Synetta Fail. Aware rx going to local pharmacy instead per pt request.  Cx rx oxybutynin sent in today by AL,NP. Resent to [facility] per pt request.   Total time of call: 22 min 15 sec

## 2022-08-28 NOTE — Addendum Note (Signed)
Addended by: Marcelina Morel L on: 08/28/2022 11:04 AM   Modules accepted: Orders

## 2022-09-12 ENCOUNTER — Other Ambulatory Visit: Payer: Self-pay | Admitting: Hematology and Oncology

## 2022-09-28 ENCOUNTER — Encounter: Payer: Self-pay | Admitting: Neurology

## 2022-09-28 ENCOUNTER — Ambulatory Visit: Payer: BC Managed Care – PPO | Admitting: Neurology

## 2022-09-28 VITALS — BP 126/95 | HR 91 | Ht 65.0 in | Wt 191.0 lb

## 2022-09-28 DIAGNOSIS — R269 Unspecified abnormalities of gait and mobility: Secondary | ICD-10-CM

## 2022-09-28 DIAGNOSIS — F319 Bipolar disorder, unspecified: Secondary | ICD-10-CM

## 2022-09-28 DIAGNOSIS — D591 Autoimmune hemolytic anemia, unspecified: Secondary | ICD-10-CM

## 2022-09-28 DIAGNOSIS — R3915 Urgency of urination: Secondary | ICD-10-CM

## 2022-09-28 DIAGNOSIS — Z79899 Other long term (current) drug therapy: Secondary | ICD-10-CM | POA: Diagnosis not present

## 2022-09-28 DIAGNOSIS — G35 Multiple sclerosis: Secondary | ICD-10-CM

## 2022-09-28 MED ORDER — ARMODAFINIL 150 MG PO TABS: 150.0000 mg | ORAL_TABLET | Freq: Every morning | ORAL | 5 refills | Status: DC

## 2022-09-28 NOTE — Progress Notes (Signed)
[facility]  PATIENT: [name] DOB: [birth year]  REFERRING DOCTOR OR PCP: Rogelia Mire MD; Aliene Beams, MD (PCP) SOURCE: Patient, notes from recent hospital stay, imaging and lab reports, MRI images personally reviewed.  _________________________________   HISTORICAL  CHIEF COMPLAINT:  Chief Complaint  Patient presents with   Room 10    Pt is here Alone. Pt states that she has been taking the Baclofen at night because it helps her be able to walk the next morning. Pt states that she has some aching in her legs. Pt states she doesn't have any back pain. Pt states that is forgetting things more lately.     HISTORY OF PRESENT ILLNESS:  I had the pleasure of seeing the patient, [name], at [facility] for neurologic consultation about her recent weakness and gait disturbance and abnormal MRIs consistent with MS.   Update 09/28/2022: She started Ocrevus Jan/Feb 2024.   She had been on Tysabri  She is tolerating it well and has no exacerbation or new neurologic symptom since starting  She walks without a cane for the most part.    She can walk > 1/2 mile without a break.  Going up and down stairs is always difficult.  She has spasticity when she walks, helped by taking baclofen 20 mg at bedtime.   Stairs are difficult and she needs the bannister.   The left side is slightly weaker than right.  .   She has mild left leg numbness.         Bladder function is better.   She has some urgency, especially in the mornings.  She stopped oxybutynin but would like to go back on it.    She has some bowel urgency.  Vision is doing well if she wears glasses.  She has h/o left sided ON  Fatigue is better since starting Tysabri.     She is sleeping well at night.  She has bipolar disease and currently some depression.   She is on lamotrigine, buspirone, risperidone and alprazolam with benefit.     She notes some trouble with memory and focus.  She  works with Audiological scientist.    MS HISTORY: She had left sided numbness, poor balance and poor gait starting around Christmas 2022.   This was worsening over the next couple days.   She presented to urgent care 04/19/2021 and aa stroke was suspected.   She was then sent to the ED.   In the ED, she had MRI's.  She was admitted to [facility] and had 5 days of IV Solumedrol.   PT was initiated.  She started ti improve byt the end of her hospital stay and has continued to improve.   She started outpatient PT but due to losing her job, she stopped but is still doing the exercises.   In retrospect, in 2015, she had diplopia and blurry right vision.   An MRI at the time was read as normal (my read has one small T2/Flair focus).   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  After the infusion of contrast (the next day) 2 periventricular foci and 1 deep white matter focus enhanced consistent  with acute demyelination.  MRI of the thoracic spine with and without contrast 04/20/2021 showed multiple T2 hyperintense foci within the spinal cord.  These are located at C7, towards the left at T1, centrally towards the right at T5-T6, posterolaterally to the right at T6, centrally to the left adjacent to T7, to the right adjacent to T8-T9 anterolaterally to the right adjacent to T11.  The foci adjacent to C2, C5, C6, T1, T8 and T9 and T11 enhanced after contrast.  Additionally, there are disc protrusions at C6-C7, to the left at T7-T8, broadly at T10-T11 that causes mild spinal stenosis.  Disc bulge is noted to the left at T6-T7  MRI of the brain 12/27/2013 (at the time of right visual loss) was read as  normal.  There appears to be 1 small nonenhancing focus in the left posterior frontal lobe.   Laboratory: January 2023: Vitamin D was low at 10.  Hemoglobin A1c was normal.  HIV negative.  RPR negative.  QuantiFERON-TB was indeterminant, hepatitis panel was negative, hep C RNA was negative.Marland Kitchen  REVIEW OF SYSTEMS: Constitutional: No fevers, chills, sweats, or change in appetite Eyes: No visual changes, double vision, eye pain Ear, nose and throat: No hearing loss, ear pain, nasal congestion, sore throat Cardiovascular: No chest pain, palpitations Respiratory:  No shortness of breath at rest or with exertion.   No wheezes GastrointestinaI: No nausea, vomiting, diarrhea, abdominal pain, fecal incontinence Genitourinary:  No dysuria, urinary retention or frequency.  No nocturia. Musculoskeletal:  No neck pain, back pain Integumentary: No rash, pruritus, skin lesions Neurological: as above Psychiatric: No depression at this time.  No anxiety Endocrine: No palpitations, diaphoresis, change in appetite, change in weigh or increased thirst Hematologic/Lymphatic:  No anemia, purpura, petechiae. Allergic/Immunologic: No itchy/runny eyes, nasal congestion, recent allergic reactions, rashes  ALLERGIES: Allergies  Allergen Reactions   Rituximab Other (See Comments)    Patient had hypersensitivity reaction during first administration. Please see progress notes dated 10/24/2021.    HOME MEDICATIONS:  Current Outpatient Medications:    ALPRAZolam (XANAX) 1 MG tablet, TAKE 1 TABLET BY MOUTH THREE TIMES A DAY AS NEEDED FOR ANXIETY, Disp: 90 tablet, Rfl: 5   aspirin EC 81 MG tablet, Take 81 mg by mouth daily. Swallow whole., Disp: , Rfl:    baclofen (LIORESAL) 10 MG tablet, Take 0.5-1 tablets (5-10 mg total) by mouth 3 (three) times daily., Disp: 90 each, Rfl: 5   lamoTRIgine (LAMICTAL) 150 MG tablet, Take 2 tablets (300 mg total) by mouth at bedtime., Disp: 180 tablet, Rfl: 1   ocrelizumab (OCREVUS)  300 MG/10ML injection, Inject into the vein once. Initial dose: 300mg  IV on day 1 and repeat day 15. Maintenance: 600mg  IV q 6months, Disp: , Rfl:    omeprazole (PRILOSEC) 40 MG capsule, TAKE 1 CAPSULE (40 MG TOTAL) BY MOUTH DAILY., Disp: 90 capsule, Rfl: 0   oxybutynin (DITROPAN) 5 MG tablet, Take 1 tablet (5 mg total) by mouth 3 (three) times daily., Disp: 270 tablet, Rfl: 1   risperidone (RISPERDAL) 4 MG tablet, Take 1 tablet (4 mg total) by mouth daily., Disp: 90 tablet, Rfl: 1   folic acid (FOLVITE) 1 MG tablet, Take 1 mg by mouth daily. (Patient not taking: Reported on 08/28/2022), Disp: , Rfl:    rivaroxaban (XARELTO) 20 MG TABS tablet, Take 1 tablet (20 mg total) by mouth daily with supper. (Patient not taking: Reported on 08/28/2022), Disp: 30 tablet, Rfl: 11   vitamin B-12 (CYANOCOBALAMIN) 1000 MCG tablet, Take  1,000 mcg by mouth daily. (Patient not taking: Reported on 08/28/2022), Disp: , Rfl:   PAST MEDICAL HISTORY: Past Medical History:  Diagnosis Date   Anxiety    Bipolar 1 disorder (HCC)    Hepatitis C     PAST SURGICAL HISTORY: Past Surgical History:  Procedure Laterality Date   CESAREAN SECTION  x 2    FAMILY HISTORY: Family History  Problem Relation Age of Onset   High blood pressure Mother     SOCIAL HISTORY:  Social History   Socioeconomic History   Marital status: Married    Spouse name: Loraine Leriche   Number of children: 2   Years of education: Not on file   Highest education level: Bachelor's degree (e.g., BA, AB, BS)  Occupational History   Occupation: accounts payable  Tobacco Use   Smoking status: Every Day    Types: E-cigarettes   Smokeless tobacco: Never   Tobacco comments:    vapes q 2 hours or so. Used to smoke 1 ppd  Vaping Use   Vaping Use: Every day  Substance and Sexual Activity   Alcohol use: Never   Drug use: Never   Sexual activity: Yes    Birth control/protection: Surgical  Other Topics Concern   Not on file  Social History Narrative    Live at home with husband and 2 children   Right handed   Caffeine: 1 C of coffee a day, occ a soda a day   Social Determinants of Corporate investment banker Strain: Not on file  Food Insecurity: Not on file  Transportation Needs: Not on file  Physical Activity: Not on file  Stress: Not on file  Social Connections: Not on file  Intimate Partner Violence: Not on file     PHYSICAL EXAM  Vitals:   09/28/22 1245  BP: (!) 126/95  Pulse: 91  Weight: 191 lb (86.6 kg)  Height: 5\' 5"  (1.651 m)    Body mass index is 31.78 kg/m.   General: The patient is well-developed and well-nourished and in no acute distress  HEENT:  Head  is [place]/AT  Skin: Extremities are without rash or  edema.  Musculoskeletal:  Back is nontender  Neurologic Exam  Mental status: The patient is alert and oriented x 3 at the time of the examination. The patient has apparent normal recent and remote memory, with an apparently normal attention span and concentration ability.   Speech is normal.  Cranial nerves: Extraocular movements are full. Pupils are equal, round, and reactive to light and accomodation.  Mild reduced colors and acuity OS. .   There is good facial sensation to soft touch bilaterally.Facial strength is normal.  No obvious hearing deficits are noted.  Motor:  Muscle bulk is normal.   Tone is increased in left >right leg. . Strength is  5 / 5 in aamrs and right leg but 4+/5 in left iliopsoas and foot/ankle.   Sensory: Sensory testing now normal in arms and legs    Coordination: Cerebellar testing reveals good finger-nose-finger and heel-to-shin bilaterally.  Gait and station: Station is normal.   Gait is slightly wide/slightly reduced stride and tandem gait is wide.. Romberg is negative.   Reflexes: Deep tendon reflexes are symmetric and normal bilaterally.       DIAGNOSTIC DATA (LABS, IMAGING, TESTING) - I reviewed patient records, labs, notes, testing and imaging myself where  available.  Lab Results  Component Value Date   WBC 6.2 04/19/2022   HGB 14.7 04/19/2022  HCT 40.9 04/19/2022   MCV 89.1 04/19/2022   PLT 218 04/19/2022      Component Value Date/Time   NA 138 04/19/2022 0838   K 4.2 04/19/2022 0838   CL 105 04/19/2022 0838   CO2 27 04/19/2022 0838   GLUCOSE 95 04/19/2022 0838   BUN 13 04/19/2022 0838   CREATININE 0.85 04/19/2022 0838   CALCIUM 11.0 (H) 04/19/2022 0838   CALCIUM 10.4 (H) 04/22/2021 0119   PROT 7.0 04/19/2022 0838   ALBUMIN 4.3 04/19/2022 0838   AST 15 04/19/2022 0838   ALT 13 04/19/2022 0838   ALKPHOS 111 04/19/2022 0838   BILITOT 0.7 04/19/2022 0838   GFRNONAA >60 04/19/2022 0838   GFRAA >90 12/27/2013 1827   No results found for: "CHOL", "HDL", "LDLCALC", "LDLDIRECT", "TRIG", "CHOLHDL" Lab Results  Component Value Date   HGBA1C 4.7 (L) 04/21/2021   Lab Results  Component Value Date   VITAMINB12 323 09/27/2021   Lab Results  Component Value Date   TSH 2.595 10/06/2021       ASSESSMENT AND PLAN  Multiple sclerosis (HCC)  High risk medication use  Bipolar 1 disorder (HCC)  Gait disturbance  Urinary urgency   1   Continue Ocrevus.   Check gG/IgM and CBC/D.  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  We will check an MRI of the brain to determine if there is any breakthrough activity.  If present, consider a different disease modifying therapy. 2.  Stay active and exercise as tolerated.  If she feels off balance she should use her cane.  3.   Return in 6 months or sooner if there are new or worsening neurologic symptoms.  Anayeli Arel A. Epimenio Foot, MD, [facility] 09/28/2022, 1:28 PM Certified in Neurology, Clinical Neurophysiology, Sleep Medicine and Neuroimaging  [facility] [address], [address] [place], [place] [address] [phone]

## 2022-09-29 LAB — CBC WITH DIFFERENTIAL/PLATELET

## 2022-09-29 LAB — IGG, IGA, IGM
IgA/Immunoglobulin A, Serum: 261 mg/dL (ref 87–352)
IgM (Immunoglobulin M), Srm: 96 mg/dL (ref 26–217)

## 2022-10-02 LAB — CBC WITH DIFFERENTIAL/PLATELET
Basophils Absolute: 0.1 10*3/uL (ref 0.0–0.2)
EOS (ABSOLUTE): 0.1 10*3/uL (ref 0.0–0.4)
Hematocrit: 45.5 % (ref 34.0–46.6)
Hemoglobin: 15.4 g/dL (ref 11.1–15.9)
Immature Granulocytes: 0 %
Lymphs: 20 %
MCH: 31.3 pg (ref 26.6–33.0)
MCHC: 33.8 g/dL (ref 31.5–35.7)
Monocytes Absolute: 0.5 10*3/uL (ref 0.1–0.9)
Monocytes: 9 %
Neutrophils: 68 %
RBC: 4.92 x10E6/uL (ref 3.77–5.28)

## 2022-10-02 LAB — IGG, IGA, IGM: IgG (Immunoglobin G), Serum: 1218 mg/dL (ref 586–1602)

## 2022-10-03 ENCOUNTER — Telehealth: Payer: Self-pay | Admitting: Neurology

## 2022-10-03 NOTE — Telephone Encounter (Signed)
Pt scheduled for 45 mins MR brain w/wo contrast at [facility] for 10/10/22 at 7:30am  [employer] NPR ref# [id number]

## 2022-10-10 ENCOUNTER — Ambulatory Visit (INDEPENDENT_AMBULATORY_CARE_PROVIDER_SITE_OTHER): Payer: BC Managed Care – PPO

## 2022-10-10 DIAGNOSIS — G35 Multiple sclerosis: Secondary | ICD-10-CM

## 2022-10-10 DIAGNOSIS — Z79899 Other long term (current) drug therapy: Secondary | ICD-10-CM | POA: Diagnosis not present

## 2022-10-10 MED ORDER — GADOBENATE DIMEGLUMINE 529 MG/ML IV SOLN
20.0000 mL | Freq: Once | INTRAVENOUS | Status: AC | PRN
  Administered 2022-10-10: 20 mL via INTRAVENOUS

## 2022-10-12 ENCOUNTER — Encounter: Payer: Self-pay | Admitting: Neurology

## 2022-10-12 ENCOUNTER — Telehealth: Payer: Self-pay | Admitting: Neurology

## 2022-10-12 NOTE — Telephone Encounter (Signed)
The MRI of the brain showed 1 lesion in the left midbrain and 2 small lesions in the cerebral hemispheres that were not present on the MRI from January 2023.  Of note, she did not start Tysabri until March 2023.  She has had some fluctuating symptoms but no definite exacerbation since she started Tysabri and then since switching to Ocrevus.  We did discuss that both of those medications are generally fairly effective.  Therefore, it is probable that those 3 lesions occurred in between the time of her hospitalization and January 2023 MRI and starting a highly effective medicine March 2023.  We will continue Ocrevus.  I will want to check another MRI in May 2025 or sooner if she has significant new symptoms.

## 2022-10-17 ENCOUNTER — Encounter: Payer: Self-pay | Admitting: Neurology

## 2022-10-26 ENCOUNTER — Inpatient Hospital Stay: Payer: BC Managed Care – PPO | Attending: Hematology and Oncology

## 2022-10-26 ENCOUNTER — Inpatient Hospital Stay: Payer: BC Managed Care – PPO | Admitting: Hematology and Oncology

## 2022-10-26 ENCOUNTER — Other Ambulatory Visit: Payer: Self-pay

## 2022-10-26 ENCOUNTER — Encounter: Payer: Self-pay | Admitting: Hematology and Oncology

## 2022-10-26 ENCOUNTER — Telehealth: Payer: Self-pay

## 2022-10-26 VITALS — BP 114/88 | HR 73 | Temp 97.7°F | Resp 18 | Ht 65.0 in | Wt 190.6 lb

## 2022-10-26 DIAGNOSIS — R7989 Other specified abnormal findings of blood chemistry: Secondary | ICD-10-CM | POA: Diagnosis not present

## 2022-10-26 DIAGNOSIS — I2699 Other pulmonary embolism without acute cor pulmonale: Secondary | ICD-10-CM

## 2022-10-26 DIAGNOSIS — Z79899 Other long term (current) drug therapy: Secondary | ICD-10-CM | POA: Diagnosis not present

## 2022-10-26 DIAGNOSIS — E611 Iron deficiency: Secondary | ICD-10-CM | POA: Diagnosis not present

## 2022-10-26 DIAGNOSIS — G35 Multiple sclerosis: Secondary | ICD-10-CM | POA: Insufficient documentation

## 2022-10-26 DIAGNOSIS — R531 Weakness: Secondary | ICD-10-CM | POA: Diagnosis not present

## 2022-10-26 DIAGNOSIS — E559 Vitamin D deficiency, unspecified: Secondary | ICD-10-CM | POA: Insufficient documentation

## 2022-10-26 DIAGNOSIS — D5912 Cold autoimmune hemolytic anemia: Secondary | ICD-10-CM | POA: Diagnosis not present

## 2022-10-26 DIAGNOSIS — Z86711 Personal history of pulmonary embolism: Secondary | ICD-10-CM | POA: Insufficient documentation

## 2022-10-26 DIAGNOSIS — E538 Deficiency of other specified B group vitamins: Secondary | ICD-10-CM | POA: Insufficient documentation

## 2022-10-26 LAB — COMPREHENSIVE METABOLIC PANEL
ALT: 17 U/L (ref 0–44)
AST: 17 U/L (ref 15–41)
Albumin: 4.1 g/dL (ref 3.5–5.0)
Alkaline Phosphatase: 106 U/L (ref 38–126)
Anion gap: 4 — ABNORMAL LOW (ref 5–15)
BUN: 7 mg/dL (ref 6–20)
CO2: 26 mmol/L (ref 22–32)
Calcium: 11 mg/dL — ABNORMAL HIGH (ref 8.9–10.3)
Chloride: 109 mmol/L (ref 98–111)
Creatinine, Ser: 0.9 mg/dL (ref 0.44–1.00)
GFR, Estimated: >60 mL/min (ref >60)
Glucose, Bld: 84 mg/dL (ref 70–99)
Potassium: 4 mmol/L (ref 3.5–5.1)
Sodium: 139 mmol/L (ref 135–145)
Total Bilirubin: 0.7 mg/dL (ref 0.3–1.2)
Total Protein: 7.2 g/dL (ref 6.5–8.1)

## 2022-10-26 LAB — CBC WITH DIFFERENTIAL/PLATELET
Abs Immature Granulocytes: 0.01 10*3/uL (ref 0.00–0.07)
Basophils Absolute: 0.1 10*3/uL (ref 0.0–0.1)
Basophils Relative: 1 %
Eosinophils Absolute: 0.1 10*3/uL (ref 0.0–0.5)
Eosinophils Relative: 2 %
HCT: 43.4 % (ref 36.0–46.0)
Hemoglobin: 15.4 g/dL — ABNORMAL HIGH (ref 12.0–15.0)
Immature Granulocytes: 0 %
Lymphocytes Relative: 19 %
Lymphs Abs: 0.8 10*3/uL (ref 0.7–4.0)
MCH: 32.8 pg (ref 26.0–34.0)
MCHC: 35.5 g/dL (ref 30.0–36.0)
MCV: 92.3 fL (ref 80.0–100.0)
Monocytes Absolute: 0.5 10*3/uL (ref 0.1–1.0)
Monocytes Relative: 11 %
Neutro Abs: 3 10*3/uL (ref 1.7–7.7)
Neutrophils Relative %: 67 %
Platelets: 183 10*3/uL (ref 150–400)
RBC: 4.7 MIL/uL (ref 3.87–5.11)
RDW: 12.4 % (ref 11.5–15.5)
WBC: 4.4 10*3/uL (ref 4.0–10.5)
nRBC: 0 % (ref 0.0–0.2)

## 2022-10-26 NOTE — Assessment & Plan Note (Signed)
This has resolved We will monitor closely I plan to see her in a year

## 2022-10-26 NOTE — Progress Notes (Signed)
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   All questions were answered. The patient knows to call the clinic with any problems, questions or concerns. The total time spent in the appointment was 20 minutes encounter with patients including review of chart and various tests results, discussions about plan of care and coordination of care plan   Artis Delay, MD 10/26/2022 8:59 AM  INTERVAL HISTORY: Please see below for problem oriented charting. 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 She is doing well She has quit vaping several months ago She is asymptomatic No recent signs of anemia  REVIEW OF SYSTEMS:   Constitutional: Denies fevers, chills or abnormal weight loss Eyes: Denies blurriness of vision Ears, nose, mouth, throat, and face: Denies mucositis or sore throat Respiratory: Denies cough, dyspnea or wheezes Cardiovascular: Denies palpitation, chest discomfort or lower extremity swelling Gastrointestinal:  Denies nausea, heartburn or change in bowel habits Skin: Denies abnormal skin rashes Lymphatics: Denies new lymphadenopathy or easy bruising Neurological:Denies numbness, tingling or new weaknesses Behavioral/Psych: Mood is stable, no new changes  All other systems were reviewed with the patient and are negative.  I have reviewed the past medical history, past surgical history, social history and family history with the  patient and they are unchanged from previous note.  ALLERGIES:  is allergic to rituximab.  MEDICATIONS:  Current Outpatient Medications  Medication Sig Dispense Refill   ALPRAZolam (XANAX) 1 MG tablet TAKE 1 TABLET BY MOUTH THREE TIMES A DAY AS NEEDED FOR ANXIETY 90 tablet 5   Armodafinil 150 MG tablet Take 1 tablet (150 mg total) by mouth in the morning. 30 tablet 5   aspirin EC 81 MG tablet Take 81 mg by mouth daily. Swallow whole.     baclofen (LIORESAL) 10 MG tablet Take 0.5-1 tablets (5-10 mg total) by mouth 3 (three) times daily. 90 each 5   folic acid (FOLVITE) 1 MG tablet Take 1 mg by mouth daily. (Patient not taking: Reported on 08/28/2022)     lamoTRIgine (LAMICTAL) 150 MG tablet Take 2 tablets (300 mg total) by mouth at bedtime. 180 tablet 1   ocrelizumab (OCREVUS) 300 MG/10ML injection Inject into the vein once. Initial dose: 300mg  IV on day 1 and repeat day 15. Maintenance: 600mg  IV q 6months     omeprazole (PRILOSEC) 40 MG capsule TAKE 1 CAPSULE (40 MG TOTAL) BY MOUTH DAILY. 90 capsule 0   oxybutynin (DITROPAN) 5 MG tablet Take 1 tablet (5 mg total) by mouth 3 (three) times daily. 270 tablet 1   risperidone (RISPERDAL) 4 MG tablet Take 1 tablet (4 mg total) by mouth daily. 90 tablet 1   vitamin B-12 (CYANOCOBALAMIN) 1000 MCG tablet Take 1,000 mcg by mouth daily. (Patient not taking: Reported on 08/28/2022)     No current facility-administered medications for this visit.    SUMMARY OF ONCOLOGIC HISTORY:  [name] 47 y.o. female is here because of findings of cold agglutinin disease/anemia She is here accompanied  by her father I have reviewed her records extensively Her neurologist has been following her for multiple sclerosis and she is on treatment for this She also have history of hepatitis C due to IV drug use treated with Harvoni.  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  She also have history of DVT.  The patient  currently uses tobacco products with vaping on a regular basis.  She has recent weakness and dizziness.  She had extensive work-up in January and recently.  She was found to have elevated homocystine level, borderline B12 deficiency, folate deficiency and vitamin D deficiency.  She has not been taking these vitamin replacement products  She was found to have abnormal CBC from recent blood work Her most recent CBC showed hemoglobin of 7.9 Reticulocyte count is inappropriately low for the degree of anemia She was also noticing passage of dark urine She denies recent chest pain on exertion, shortness of breath on minimal exertion, pre-syncopal episodes, or palpitations. She had not noticed any recent bleeding such as epistaxis, hematuria or hematochezia The patient denies over the counter NSAID ingestion. She is not on antiplatelets agents.  She had no prior history or diagnosis of cancer. Her age appropriate screening programs are up-to-date. She denies any pica and eats a variety of diet. She never donated blood or received blood transfusion CT imaging on October 14, 2021 showed evidence of DVT and PE, mild splenomegaly but no evidence of lymphoma On July 10, she was started on rituximab On November 14, 2021, she received last dose of rituximab and 1 unit of blood  PHYSICAL EXAMINATION: ECOG PERFORMANCE STATUS: 0 - Asymptomatic  Vitals:   10/26/22 0818  BP: 114/88  Pulse: 73  Resp: 18  Temp: 97.7 F (36.5 C)  SpO2: 98%   Filed Weights   10/26/22 0818  Weight: 190 lb 9.6 oz (86.5 kg)    GENERAL:alert, no distress and comfortable NEURO: alert & oriented x 3 with fluent speech, no focal motor/sensory deficits  LABORATORY DATA:  I have reviewed the data as listed    Component Value Date/Time   NA 139 10/26/2022 0758   K 4.0 10/26/2022 0758   CL 109 10/26/2022 0758   CO2 26 10/26/2022 0758   GLUCOSE 84 10/26/2022 0758   BUN 7 10/26/2022 0758   CREATININE 0.90 10/26/2022 0758    CREATININE 0.85 04/19/2022 0838   CALCIUM 11.0 (H) 10/26/2022 0758   CALCIUM 10.4 (H) 04/22/2021 0119   PROT 7.2 10/26/2022 0758   ALBUMIN 4.1 10/26/2022 0758   AST 17 10/26/2022 0758   AST 15 04/19/2022 0838   ALT 17 10/26/2022 0758   ALT 13 04/19/2022 0838   ALKPHOS 106 10/26/2022 0758   BILITOT 0.7 10/26/2022 0758   BILITOT 0.7 04/19/2022 0838   GFRNONAA >60 10/26/2022 0758   GFRNONAA >60 04/19/2022 0838   GFRAA >90 12/27/2013 1827    No results found for: "SPEP", "UPEP"  Lab Results  Component Value Date   WBC 6.0 09/28/2022   NEUTROABS 4.1 09/28/2022   HGB 15.4 09/28/2022   HCT 45.5 09/28/2022   MCV 93 09/28/2022   PLT 223 09/28/2022      Chemistry      Component Value Date/Time   NA 139 10/26/2022 0758   K 4.0 10/26/2022 0758   CL 109 10/26/2022 0758   CO2 26 10/26/2022 0758   BUN 7 10/26/2022 0758   CREATININE 0.90 10/26/2022 0758   CREATININE  0.85 04/19/2022 0838      Component Value Date/Time   CALCIUM 11.0 (H) 10/26/2022 0758   CALCIUM 10.4 (H) 04/22/2021 0119   ALKPHOS 106 10/26/2022 0758   AST 17 10/26/2022 0758   AST 15 04/19/2022 0838   ALT 17 10/26/2022 0758   ALT 13 04/19/2022 0838   BILITOT 0.7 10/26/2022 0758   BILITOT 0.7 04/19/2022 0838       RADIOGRAPHIC STUDIES: I have personally reviewed the radiological images as listed and agreed with the findings in the report. MR BRAIN W WO CONTRAST  Result Date: 10/10/2022  [facility] [address] [place], [place] [address] [phone] NEUROIMAGING REPORT STUDY DATE: 10/10/2022 PATIENT NAME: [name] DOB: 1976-02-16 MRN: [id number] EXAM: MRI Brain with and without contrast ORDERING CLINICIAN: Richard A. Sater, MD. PhD CLINICAL HISTORY: 47 year old woman with MS COMPARISON FILMS: 04/20/2021 TECHNIQUE:MRI of the brain with and without contrast was obtained utilizing 5 mm axial slices with T1, T2, T2 flair, gradient echo heme weighted and diffusion weighted views.  T1  sagittal, T2 coronal and postcontrast views in the axial and coronal plane were obtained.  CONTRAST: 20 ml Multihance IMAGING SITE: [facility], [address] [place], [place], [place] FINDINGS: On sagittal images, the spinal cord is imaged caudally to C3 and is normal in caliber.   The contents of the posterior fossa are of normal size and position.   The pituitary gland and optic chiasm appear normal.    Brain volume appears normal.   The ventricles are normal in size and without distortion.  There are no abnormal extra-axial collections of fluid.  There are T2/FLAIR hyperintense foci in the periventricular, juxtacortical and deep white matter of the cerebral hemispheres.  A couple more foci are noted in the left midbrain and in the right cerebellar hemisphere.  None of the foci appear to be acute.  They do not enhance.  The focus in the left midbrain and 2 of the foci in the left parietal lobe were not present on the previous MRI from 04/20/2021.  Diffusion weighted images are normal.  Heme weighted images are normal.   The orbits appear normal.   The VIIth/VIIIth nerve complex appears normal.  The mastoid air cells appear normal.  The paranasal sinuses appear normal.  Flow voids are identified within the major intracerebral arteries.  After the infusion of contrast material, a normal enhancement pattern is noted.   This MRI of the brain with and without contrast shows the following: Multiple T2/FLAIR hyperintense foci in the cerebral hemispheres and a couple foci in the left midbrain and cerebellum in a pattern consistent with chronic demyelinating plaque associated with multiple sclerosis.  None of the foci enhanced or appear to be acute.  However, the focus in the left midbrain and 2 other foci seen on today's MRI were not present on the MRI from 04/20/2021 No acute findings.  Normal enhancement pattern. INTERPRETING PHYSICIAN: Richard A. Epimenio Foot, MD, PhD, FAAN Certified in  Neuroimaging by [employer]

## 2022-10-26 NOTE — Telephone Encounter (Signed)
-----   Message from Nurse Kasandra Knudsen sent at 10/26/2022  2:08 PM EDT -----  ----- Message ----- From: Artis Delay, MD Sent: 10/26/2022   8:49 AM EDT To: Malissa Hippo, RN  Her calcium is high I recommend she follows with PCP for evaluation

## 2022-10-26 NOTE — Assessment & Plan Note (Signed)
She has intermittent chronic hypercalcemia, could be an element of dehydration I recommend the patient to reach out to her primary care doctor for evaluation

## 2022-10-26 NOTE — Telephone Encounter (Signed)
TC to patient and notified of message below. She verbalizes understanding.

## 2022-11-30 ENCOUNTER — Encounter: Payer: Self-pay | Admitting: Neurology

## 2022-11-30 ENCOUNTER — Telehealth: Payer: Self-pay | Admitting: Neurology

## 2022-11-30 NOTE — Telephone Encounter (Signed)
LVM, sent mychart msg, sent cx letter informing pt of r/s needed for 12/26 appt- office closed.

## 2022-12-10 ENCOUNTER — Other Ambulatory Visit: Payer: Self-pay | Admitting: Hematology and Oncology

## 2022-12-15 ENCOUNTER — Encounter: Payer: Self-pay | Admitting: Neurology

## 2023-01-03 DIAGNOSIS — G35 Multiple sclerosis: Secondary | ICD-10-CM | POA: Diagnosis not present

## 2023-01-09 ENCOUNTER — Other Ambulatory Visit: Payer: Self-pay | Admitting: Neurology

## 2023-03-01 ENCOUNTER — Encounter: Payer: Self-pay | Admitting: Physician Assistant

## 2023-03-01 ENCOUNTER — Ambulatory Visit (INDEPENDENT_AMBULATORY_CARE_PROVIDER_SITE_OTHER): Payer: BC Managed Care – PPO | Admitting: Physician Assistant

## 2023-03-01 DIAGNOSIS — F411 Generalized anxiety disorder: Secondary | ICD-10-CM

## 2023-03-01 DIAGNOSIS — F319 Bipolar disorder, unspecified: Secondary | ICD-10-CM | POA: Diagnosis not present

## 2023-03-01 DIAGNOSIS — G35 Multiple sclerosis: Secondary | ICD-10-CM | POA: Diagnosis not present

## 2023-03-01 DIAGNOSIS — R69 Illness, unspecified: Secondary | ICD-10-CM

## 2023-03-01 DIAGNOSIS — Z6379 Other stressful life events affecting family and household: Secondary | ICD-10-CM

## 2023-03-01 DIAGNOSIS — Z720 Tobacco use: Secondary | ICD-10-CM

## 2023-03-01 DIAGNOSIS — Z566 Other physical and mental strain related to work: Secondary | ICD-10-CM

## 2023-03-01 DIAGNOSIS — Z79899 Other long term (current) drug therapy: Secondary | ICD-10-CM

## 2023-03-01 DIAGNOSIS — G35D Multiple sclerosis, unspecified: Secondary | ICD-10-CM

## 2023-03-01 MED ORDER — ALPRAZOLAM 1 MG PO TABS: ORAL_TABLET | ORAL | 5 refills | Status: DC

## 2023-03-01 MED ORDER — RISPERIDONE 4 MG PO TABS: 4.0000 mg | ORAL_TABLET | Freq: Every day | ORAL | 1 refills | Status: DC

## 2023-03-01 MED ORDER — LAMOTRIGINE 150 MG PO TABS: 300.0000 mg | ORAL_TABLET | Freq: Every day | ORAL | 1 refills | Status: DC

## 2023-03-01 MED ORDER — FLUOXETINE HCL 20 MG PO CAPS: 20.0000 mg | ORAL_CAPSULE | Freq: Every day | ORAL | 1 refills | Status: DC

## 2023-03-01 NOTE — Progress Notes (Signed)
[facility] Med Check  Patient ID: [name],  MRN: [id number]  PCP: Aliene Beams, MD  Date of Evaluation: 03/01/2023 Time spent: 31 minutes  Chief Complaint:  Chief Complaint   Depression; Anxiety; Follow-up    HISTORY/CURRENT STATUS: For routine med check.  More anxious lately.  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 When she gets really anxious, she gets shaky and can't control it until the situation improves.  The Xanax does help for several hours.  She tries not to take it but she has to have it or she cannot function at work.    She also feels sad, wondering what will happen with her husband and his recovery.  She is able to enjoy things when she has time.  Her sleep is fair. Energy and motivation are good.  No extreme sadness, tearfulness, or feelings of hopelessness. ADLs and personal hygiene are normal.   Denies any changes in concentration, making decisions, or remembering things.  Appetite has not changed.  Weight is stable.  Denies suicidal or homicidal thoughts.  Review of Systems  Constitutional:  Positive for malaise/fatigue.       Had a recent MS flare  HENT: Negative.    Eyes: Negative.   Respiratory: Negative.    Cardiovascular: Negative.   Gastrointestinal: Negative.   Genitourinary: Negative.   Musculoskeletal: Negative.   Skin: Negative.   Neurological:        Recent MS flare, sx better now.   Endo/Heme/Allergies: Negative.   Psychiatric/Behavioral:         See HPI   Individual Medical History/ Review of Systems: Changes? :No     Past medications for mental health diagnoses include: Lithium, Wellbutrin was not effective for depression or decreased desire for nicotine, Risperdal, Lamictal, Ativan, Chantix caused paranoia and increased anger, BuSpar made her dizzy  Allergies: Rituximab  Current Medications:  Current Outpatient Medications:    aspirin EC 81 MG tablet, Take 81 mg by  mouth daily. Swallow whole., Disp: , Rfl:    baclofen (LIORESAL) 10 MG tablet, TAKE 0.5-1 TABLETS (5-10 MG TOTAL) BY MOUTH 3 (THREE) TIMES DAILY., Disp: 270 tablet, Rfl: 1   FLUoxetine (PROZAC) 20 MG capsule, Take 1 capsule (20 mg total) by mouth daily., Disp: 30 capsule, Rfl: 1   ocrelizumab (OCREVUS) 300 MG/10ML injection, Inject into the vein once. Initial dose: 300mg  IV on day 1 and repeat day 15. Maintenance: 600mg  IV q 6months, Disp: , Rfl:    oxybutynin (DITROPAN) 5 MG tablet, Take 1 tablet (5 mg total) by mouth 3 (three) times daily., Disp: 270 tablet, Rfl: 1   ALPRAZolam (XANAX) 1 MG tablet, TAKE 1 TABLET BY MOUTH THREE TIMES A DAY AS NEEDED FOR ANXIETY, Disp: 90 tablet, Rfl: 5   folic acid (FOLVITE) 1 MG tablet, Take 1 mg by mouth daily. (Patient not taking: Reported on 08/28/2022), Disp: , Rfl:    lamoTRIgine (LAMICTAL) 150 MG tablet, Take 2 tablets (300 mg total) by mouth at bedtime., Disp: 180 tablet, Rfl: 1   omeprazole (PRILOSEC) 40 MG capsule, TAKE 1 CAPSULE (40 MG TOTAL) BY MOUTH DAILY. (Patient not taking: Reported on 03/01/2023), Disp: 90 capsule, Rfl: 0   risperidone (RISPERDAL) 4 MG tablet, Take 1 tablet (4 mg total) by mouth daily., Disp: 90 tablet, Rfl: 1   vitamin B-12 (CYANOCOBALAMIN) 1000 MCG tablet, Take 1,000 mcg by mouth daily. (Patient not taking: Reported on 08/28/2022), Disp: , Rfl:  Medication Side Effects: none  Family Medical/ Social History: Changes?   Her husband has been sick, in and out of the hospital a couple of times in the past 6 months.  Has heart problems.  He has been unable to work. She has a new job.  MENTAL HEALTH EXAM:  There were no vitals taken for this visit.There is no height or weight on file to calculate BMI.  General Appearance: Casual and Well Groomed  Eye Contact:  Good  Speech:  Clear and Coherent and Normal Rate  Volume:  Normal  Mood:  Anxious  Affect:  Anxious  Thought Process:  Goal Directed and Descriptions of Associations:  Circumstantial  Orientation:  Full (Time, Place, and Person)  Thought Content: Logical   Suicidal Thoughts:  No  Homicidal Thoughts:  No  Memory:  WNL  Judgement:  Good  Insight:  Good  Psychomotor Activity:  Normal  Concentration:  Concentration: Good and Attention Span: Good  Recall:  Good  Fund of Knowledge: Good  Language: Good  Assets:  Desire for Improvement Financial Resources/Insurance Housing Transportation Vocational/Educational  ADL's:  Intact  Cognition: WNL  Prognosis:  Good   Labs 10/26/2022 CBC with differential hemoglobin 15.4, otherwise normal CMP calcium 11.0, glucose 84, all other values are normal No recent lipid profile  DIAGNOSES:    ICD-10-CM   1. Generalized anxiety disorder  F41.1     2. Bipolar I disorder (HCC)  F31.9 Lipid panel    3. Multiple sclerosis (HCC)  G35     4. Vapes nicotine containing substance  Z72.0     5. Chronic illness  R69     6. Stress due to illness of family member  Z63.79     7. Work stress  Z56.6     8. Encounter for long-term (current) use of medications  Z79.899 Lipid panel     Receiving Psychotherapy: No   RECOMMENDATIONS:  PDMP was reviewed.  Last Xanax 02/12/2023. I provided 31  minutes of face to face time during this encounter, including time spent before and after the visit in records review, medical decision making, counseling pertinent to today's visit, and charting.   We discussed her symptoms.  I recommend adding an SSRI to help with prevention of anxiety and situational depression.  Benefits, risk and side effects were discussed and she accepts and would like to try it.  I provided approximately 18 minutes in counseling for cessation of vaping, and coping skills for anxiety.  Continue Xanax 1 mg to 1 p.o. 3 times daily as needed. Start Prozac 20 mg, 1 p.o. daily. Continue Lamictal 150 mg twice daily. Continue Risperdal 4 mg nightly. Recommend counseling. Lab ordered as noted above. Return in 2  months.  Melony Overly, PA-C

## 2023-03-21 ENCOUNTER — Ambulatory Visit: Payer: BC Managed Care – PPO | Admitting: Neurology

## 2023-03-21 ENCOUNTER — Encounter: Payer: Self-pay | Admitting: Neurology

## 2023-03-21 VITALS — BP 117/79 | HR 81 | Ht 65.0 in | Wt 189.5 lb

## 2023-03-21 DIAGNOSIS — R3915 Urgency of urination: Secondary | ICD-10-CM

## 2023-03-21 DIAGNOSIS — D591 Autoimmune hemolytic anemia, unspecified: Secondary | ICD-10-CM | POA: Diagnosis not present

## 2023-03-21 DIAGNOSIS — R269 Unspecified abnormalities of gait and mobility: Secondary | ICD-10-CM | POA: Diagnosis not present

## 2023-03-21 DIAGNOSIS — F319 Bipolar disorder, unspecified: Secondary | ICD-10-CM

## 2023-03-21 DIAGNOSIS — Z79899 Other long term (current) drug therapy: Secondary | ICD-10-CM

## 2023-03-21 DIAGNOSIS — G35 Multiple sclerosis: Secondary | ICD-10-CM | POA: Diagnosis not present

## 2023-03-21 NOTE — Progress Notes (Signed)
[facility]  PATIENT: [name] DOB: 01-04-1976  REFERRING DOCTOR OR PCP: Rogelia Mire MD; Aliene Beams, MD (PCP) SOURCE: Patient, notes from recent hospital stay, imaging and lab reports, MRI images personally reviewed.  _________________________________   HISTORICAL  CHIEF COMPLAINT:  Chief Complaint  Patient presents with   Room 11    Pt is here Alone. Pt states that things has been okay with her MS since her last appointment. Pt states that she has trouble walking in the morning,but she is fine the rest of the day. Pt denies and questions or concerns to discuss today.     HISTORY OF PRESENT ILLNESS:  I had the pleasure of seeing the patient, [name], at [facility] for neurologic consultation about her recent weakness and gait disturbance and abnormal MRIs consistent with MS.   Update 03/21/2023: She started Ocrevus Jan/Feb 2024.   She had been on Tysabri but switched to reduce days off work and because she started Rituxan for cold agglutinin disease.    She is tolerating it well and has no exacerbation or new neurologic symptom since starting  She walks without support most of the time.    She can walk > 1/2 mile without a break.  Going up and down stairs is always difficult.  She has spasticity when she walks, helped by taking baclofen 20 mg at bedtime.   Stairs are difficult and she needs the bannister.   The left side is slightly weaker than right.  .   She has mild left leg numbness.         Bladder function is better.   She has some urgency, especially in the mornings.  She takes oxybutynin which greatly helps urgency but she has mild hesitancy.    Vision is doing well if she wears glasses.  She has h/o left sided ON.  Glare seems worse.   She has new glasses ordered.   Fatigue is better since starting Tysabri.     She is sleeping well at night.  She has bipolar disease and currently some depression.   She is on  lamotrigine, risperidone and alprazolam with benefit.   She stopped buspirone.    She notes some trouble with memory and focus.  She notes more stress recently with husbands illness.  She works with Audiological scientist. 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 Bertis Ruddy)   MS HISTORY: She had left sided numbness, poor balance and poor gait starting around Christmas 2022.   This was worsening over the next couple days.   She presented to urgent care 04/19/2021 and aa stroke was suspected.   She was then sent to the ED.   In the ED, she had MRI's.  She was admitted to [facility] and had 5 days of IV Solumedrol.   PT was initiated.  She started ti improve byt the end of her hospital stay and has continued to improve.   She started outpatient PT but due to losing her job, she stopped but is still doing the exercises.   In retrospect, in 2015, she had diplopia and blurry right vision.   An MRI at the time was read  as normal (my read has one small T2/Flair focus).   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 None of the foci enhanced or appear to be acute. However, the focus in the left midbrain and 2 other foci seen on today's MRI were not present on the MRI from 04/20/2021   MRI of the brain 04/19/2021 and 04/20/2021 showed multiple T2/FLAIR foci in the  periventricular and deep white matter of the hemispheres consistent with demyelinating plaque.  After the infusion of contrast (the next day) 2 periventricular foci and 1 deep white matter focus enhanced consistent with acute demyelination.  MRI of the thoracic spine with and without contrast 04/20/2021 showed multiple T2 hyperintense foci within the spinal cord.  These are located at C7, towards the left at T1, centrally towards the right at T5-T6, posterolaterally to the right at T6, centrally to the left adjacent to T7, to the right adjacent to T8-T9 anterolaterally to the right adjacent to T11.  The foci adjacent to C2, C5, C6, T1, T8 and T9 and T11 enhanced after contrast.  Additionally, there are disc protrusions at C6-C7, to the left at T7-T8, broadly at T10-T11 that causes mild spinal stenosis.  Disc bulge is noted to the left at T6-T7  MRI of the brain 12/27/2013 (at the time of right visual loss) was read as normal.  There appears to be 1 small nonenhancing focus in the left posterior frontal lobe.   Laboratory: January 2023: Vitamin D was low at 10.  Hemoglobin A1c was normal.  HIV negative.  RPR negative.  QuantiFERON-TB was indeterminant, hepatitis panel was negative, hep C RNA was negative.[name]  REVIEW OF SYSTEMS: Constitutional: No fevers, chills, sweats, or change in appetite Eyes: No visual changes, double vision, eye pain Ear, nose and throat: No hearing loss, ear pain, nasal congestion, sore throat Cardiovascular: No chest pain, palpitations Respiratory:  No shortness of breath at rest or with exertion.   No wheezes GastrointestinaI: No nausea, vomiting, diarrhea, abdominal pain, fecal incontinence Genitourinary:  No dysuria, urinary retention or frequency.  No nocturia. Musculoskeletal:  No neck pain, back pain Integumentary: No rash, pruritus, skin lesions Neurological: as above Psychiatric: No depression at this time.  No anxiety Endocrine: No palpitations, diaphoresis, change  in appetite, change in weigh or increased thirst Hematologic/Lymphatic:  No anemia, purpura, petechiae. Allergic/Immunologic: No itchy/runny eyes, nasal congestion, recent allergic reactions, rashes  ALLERGIES: Allergies  Allergen Reactions   Rituximab Other (See Comments)    Patient had hypersensitivity reaction during first administration. Please see progress notes dated 10/24/2021.    HOME MEDICATIONS:  Current Outpatient Medications:    ALPRAZolam (XANAX) 1 MG tablet, TAKE 1 TABLET BY MOUTH THREE TIMES A DAY AS NEEDED FOR ANXIETY, Disp: 90 tablet, Rfl: 5   aspirin EC 81 MG tablet, Take 81 mg by mouth daily. Swallow whole., Disp: , Rfl:    baclofen (LIORESAL) 10 MG tablet, TAKE 0.5-1 TABLETS (5-10 MG TOTAL) BY MOUTH 3 (THREE) TIMES DAILY., Disp: 270 tablet, Rfl: 1   FLUoxetine (PROZAC) 20 MG capsule, Take 1 capsule (20 mg total) by mouth daily., Disp: 30 capsule, Rfl: 1   lamoTRIgine (  LAMICTAL) 150 MG tablet, Take 2 tablets (300 mg total) by mouth at bedtime., Disp: 180 tablet, Rfl: 1   ocrelizumab (OCREVUS) 300 MG/10ML injection, Inject into the vein once. Initial dose: 300mg  IV on day 1 and repeat day 15. Maintenance: 600mg  IV q 6months, Disp: , Rfl:    omeprazole (PRILOSEC) 40 MG capsule, TAKE 1 CAPSULE (40 MG TOTAL) BY MOUTH DAILY., Disp: 90 capsule, Rfl: 0   oxybutynin (DITROPAN) 5 MG tablet, Take 1 tablet (5 mg total) by mouth 3 (three) times daily., Disp: 270 tablet, Rfl: 1   risperidone (RISPERDAL) 4 MG tablet, Take 1 tablet (4 mg total) by mouth daily., Disp: 90 tablet, Rfl: 1   folic acid (FOLVITE) 1 MG tablet, Take 1 mg by mouth daily. (Patient not taking: Reported on 08/28/2022), Disp: , Rfl:    vitamin B-12 (CYANOCOBALAMIN) 1000 MCG tablet, Take 1,000 mcg by mouth daily. (Patient not taking: Reported on 08/28/2022), Disp: , Rfl:   PAST MEDICAL HISTORY: Past Medical History:  Diagnosis Date   Anxiety    Bipolar 1 disorder (HCC)    Hepatitis C     PAST SURGICAL  HISTORY: Past Surgical History:  Procedure Laterality Date   CESAREAN SECTION  x 2    FAMILY HISTORY: Family History  Problem Relation Age of Onset   High blood pressure Mother     SOCIAL HISTORY:  Social History   Socioeconomic History   Marital status: Married    Spouse name: Loraine Leriche   Number of children: 2   Years of education: Not on file   Highest education level: Bachelor's degree (e.g., BA, AB, BS)  Occupational History   Occupation: accounts payable  Tobacco Use   Smoking status: Every Day    Types: E-cigarettes   Smokeless tobacco: Never   Tobacco comments:    vapes q 2 hours or so. Used to smoke 1 ppd  Vaping Use   Vaping status: Every Day  Substance and Sexual Activity   Alcohol use: Never   Drug use: Never   Sexual activity: Yes    Birth control/protection: Surgical  Other Topics Concern   Not on file  Social History Narrative   Live at home with husband and 2 children   Right handed   Caffeine: 1 C of coffee a day, occ a soda a day   Social Determinants of Corporate investment banker Strain: Not on file  Food Insecurity: Not on file  Transportation Needs: Not on file  Physical Activity: Not on file  Stress: Not on file  Social Connections: Not on file  Intimate Partner Violence: Not on file     PHYSICAL EXAM  Vitals:   03/21/23 0826  BP: 117/79  Pulse: 81  Weight: 189 lb 8 oz (86 kg)  Height: 5\' 5"  (1.651 m)    Body mass index is 31.53 kg/m.   General: The patient is well-developed and well-nourished and in no acute distress  HEENT:  Head  is [place]/AT  Skin: Extremities are without rash or  edema.  Musculoskeletal:  Back is nontender  Neurologic Exam  Mental status: The patient is alert and oriented x 3 at the time of the examination. The patient has apparent normal recent and remote memory, with an apparently normal attention span and concentration ability.   Speech is normal.  Cranial nerves: Extraocular movements are full.  Pupils are equal, round, and reactive to light and accomodation.  She has reduced color saturation and acuity OS. [name]  There is good facial sensation to soft touch bilaterally.Facial strength is normal.  No obvious hearing deficits are noted.  Motor:  Muscle bulk is normal.   Tone is increased in left >right leg. . Strength is  5 / 5 in aamrs and right leg but 4+/5 in left iliopsoas and foot/ankle.   Sensory: Sensory testing now normal in arms and legs    Coordination: Cerebellar testing reveals good finger-nose-finger and heel-to-shin bilaterally.  Gait and station: Station is normal.   Gait is slightly wide/slightly reduced stride.   The tandem gait is wide.. Romberg is negative.   Reflexes: Deep tendon reflexes are symmetric and normal bilaterally.       DIAGNOSTIC DATA (LABS, IMAGING, TESTING) - I reviewed patient records, labs, notes, testing and imaging myself where available.  Lab Results  Component Value Date   WBC 4.4 10/26/2022   HGB 15.4 (H) 10/26/2022   HCT 43.4 10/26/2022   MCV 92.3 10/26/2022   PLT 183 10/26/2022      Component Value Date/Time   NA 139 10/26/2022 0758   K 4.0 10/26/2022 0758   CL 109 10/26/2022 0758   CO2 26 10/26/2022 0758   GLUCOSE 84 10/26/2022 0758   BUN 7 10/26/2022 0758   CREATININE 0.90 10/26/2022 0758   CREATININE 0.85 04/19/2022 0838   CALCIUM 11.0 (H) 10/26/2022 0758   CALCIUM 10.4 (H) 04/22/2021 0119   PROT 7.2 10/26/2022 0758   ALBUMIN 4.1 10/26/2022 0758   AST 17 10/26/2022 0758   AST 15 04/19/2022 0838   ALT 17 10/26/2022 0758   ALT 13 04/19/2022 0838   ALKPHOS 106 10/26/2022 0758   BILITOT 0.7 10/26/2022 0758   BILITOT 0.7 04/19/2022 0838   GFRNONAA >60 10/26/2022 0758   GFRNONAA >60 04/19/2022 0838   GFRAA >90 12/27/2013 1827   No results found for: "CHOL", "HDL", "LDLCALC", "LDLDIRECT", "TRIG", "CHOLHDL" Lab Results  Component Value Date   HGBA1C 4.7 (L) 04/21/2021   Lab Results  Component Value Date   VITAMINB12  323 09/27/2021   Lab Results  Component Value Date   TSH 2.595 10/06/2021       ASSESSMENT AND PLAN  Multiple sclerosis (HCC)  Autoimmune hemolytic anemia (HCC)  Bipolar 1 disorder (HCC)  Gait disturbance  Urinary urgency  High risk medication use   1   Continue Ocrevus.   Check gG/IgM and CBC/D.  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  We will check labs (IgG/IgM) and an MRI of the brain in 2025 to determine if there is any breakthrough activity.  If present, consider a different disease modifying therapy. 2.  Stay active and exercise as tolerated.  If she feels off balance she should use her cane.  3.   Return in 6 months or sooner if there are new or worsening neurologic symptoms.  Kvion Shapley A. Epimenio Foot, MD, [facility] 03/21/2023, 9:27 AM Certified in Neurology, Clinical Neurophysiology, Sleep Medicine and Neuroimaging  [facility] [address] [place], [place] [address] [phone]

## 2023-03-25 ENCOUNTER — Other Ambulatory Visit: Payer: Self-pay | Admitting: Physician Assistant

## 2023-04-12 ENCOUNTER — Ambulatory Visit: Payer: BC Managed Care – PPO | Admitting: Neurology

## 2023-04-30 ENCOUNTER — Encounter: Payer: Self-pay | Admitting: Physician Assistant

## 2023-04-30 ENCOUNTER — Ambulatory Visit (INDEPENDENT_AMBULATORY_CARE_PROVIDER_SITE_OTHER): Payer: BC Managed Care – PPO | Admitting: Physician Assistant

## 2023-04-30 DIAGNOSIS — G35 Multiple sclerosis: Secondary | ICD-10-CM

## 2023-04-30 DIAGNOSIS — F319 Bipolar disorder, unspecified: Secondary | ICD-10-CM | POA: Diagnosis not present

## 2023-04-30 DIAGNOSIS — Z560 Unemployment, unspecified: Secondary | ICD-10-CM

## 2023-04-30 DIAGNOSIS — F411 Generalized anxiety disorder: Secondary | ICD-10-CM

## 2023-04-30 DIAGNOSIS — Z6379 Other stressful life events affecting family and household: Secondary | ICD-10-CM

## 2023-04-30 DIAGNOSIS — Z720 Tobacco use: Secondary | ICD-10-CM

## 2023-04-30 DIAGNOSIS — F17290 Nicotine dependence, other tobacco product, uncomplicated: Secondary | ICD-10-CM | POA: Diagnosis not present

## 2023-04-30 MED ORDER — FLUOXETINE HCL 40 MG PO CAPS
40.0000 mg | ORAL_CAPSULE | Freq: Every day | ORAL | 0 refills | Status: DC
Start: 2023-04-30 — End: 2023-06-22

## 2023-04-30 NOTE — Progress Notes (Signed)
 [facility]  Patient ID: [name],  MRN: [id number]  PCP: Rolinda Millman, MD  Date of Evaluation: 04/30/2023 Time spent:30 minutes  Chief Complaint:  Chief Complaint   Anxiety; Depression; Insomnia; Follow-up    Virtual Visit via Telehealth  I connected with patient by telephone, with their informed consent, and verified patient privacy and that I am speaking with the correct person using two identifiers.  I am private, in my office and the patient is at home.  I discussed the limitations, risks, security and privacy concerns of performing an evaluation and management service by telephone and the availability of in person appointments. I also discussed with the patient that there may be a patient responsible charge related to this service. The patient expressed understanding and agreed to proceed.   I discussed the assessment and treatment plan with the patient. The patient was provided an opportunity to ask questions and all were answered. The patient agreed with the plan and demonstrated an understanding of the instructions.   The patient was advised to call back or seek an in-person evaluation if the symptoms worsen or if the condition fails to improve as anticipated.  I provided 30 minutes of non-face-to-face time during this encounter.  HISTORY/CURRENT STATUS: For routine 2 month med check.  States she is not doing well right now.  She lost her job since we saw each other.  She is looking for another job but is having a difficult time.  She is living off of her 401(k).  She is very stressed and asks if we could switch the Xanax  to Klonopin .  We had talked about that in the past.  The efficacy is longer than the Xanax  usually is.  She is not having panic attacks but gets so overwhelmed at times she does not know what to do with herself.  The Xanax  is effective but it does not work for very long, maybe a couple of hours.  We added Prozac  at the last visit 2 months  ago.  She does not think it is really helped the anxiety much at all.  She is having a hard time enjoying things, not only because she feels sad but because the MS is acting up.  She is under the care of a neurologist.  Energy and motivation are fair most of the time.  ADLs and personal hygiene are normal.  She sleeps well most of the time.  Appetite is normal and weight is stable.  Denies suicidal or homicidal thoughts.  Patient denies increased energy with decreased need for sleep, increased talkativeness, racing thoughts, impulsivity or risky behaviors, increased spending, increased libido, grandiosity, increased irritability or anger, paranoia, or hallucinations.  Denies dizziness, syncope, seizures, numbness, tingling, tremor, tics, unsteady gait, slurred speech, confusion. Denies muscle or joint pain, stiffness, or dystonia. Denies unexplained weight loss, frequent infections, or sores that heal slowly.  No polyphagia, polydipsia, or polyuria. Denies visual changes or paresthesias.   Individual Medical History/ Review of Systems: Changes? :Yes   currently has MS flare.  Once she gets up and stretches and walks she feels better.  She saw her neurologist since our last visit.  Note reviewed.  Past medications for mental health diagnoses include: Lithium, Wellbutrin was not effective for depression or decreased desire for nicotine , Risperdal , Lamictal , Ativan , Chantix caused paranoia and increased anger, BuSpar  made her dizzy  Allergies: Rituximab   Current Medications:  Current Outpatient Medications:    ALPRAZolam  (XANAX ) 1 MG tablet, TAKE 1 TABLET BY MOUTH  THREE TIMES A DAY AS NEEDED FOR ANXIETY, Disp: 90 tablet, Rfl: 5   aspirin EC 81 MG tablet, Take 81 mg by mouth daily. Swallow whole., Disp: , Rfl:    baclofen  (LIORESAL ) 10 MG tablet, TAKE 0.5-1 TABLETS (5-10 MG TOTAL) BY MOUTH 3 (THREE) TIMES DAILY., Disp: 270 tablet, Rfl: 1   FLUoxetine  (PROZAC ) 40 MG capsule, Take 1 capsule (40 mg  total) by mouth daily., Disp: 90 capsule, Rfl: 0   lamoTRIgine  (LAMICTAL ) 150 MG tablet, Take 2 tablets (300 mg total) by mouth at bedtime., Disp: 180 tablet, Rfl: 1   ocrelizumab  (OCREVUS ) 300 MG/10ML injection, Inject into the vein once. Initial dose: 300mg  IV on day 1 and repeat day 15. Maintenance: 600mg  IV q 6months, Disp: , Rfl:    risperidone  (RISPERDAL ) 4 MG tablet, Take 1 tablet (4 mg total) by mouth daily., Disp: 90 tablet, Rfl: 1   folic acid  (FOLVITE ) 1 MG tablet, Take 1 mg by mouth daily. (Patient not taking: Reported on 04/30/2023), Disp: , Rfl:    omeprazole  (PRILOSEC) 40 MG capsule, TAKE 1 CAPSULE (40 MG TOTAL) BY MOUTH DAILY. (Patient not taking: Reported on 04/30/2023), Disp: 90 capsule, Rfl: 0   oxybutynin  (DITROPAN ) 5 MG tablet, Take 1 tablet (5 mg total) by mouth 3 (three) times daily. (Patient not taking: Reported on 04/30/2023), Disp: 270 tablet, Rfl: 1   vitamin B-12 (CYANOCOBALAMIN ) 1000 MCG tablet, Take 1,000 mcg by mouth daily. (Patient not taking: Reported on 04/30/2023), Disp: , Rfl:  Medication Side Effects: none  Family Medical/ Social History: Changes?   Her husband has cirrhosis.  Unable to work.  She lost her job since our last visit 2 months ago.  MENTAL HEALTH EXAM:  There were no vitals taken for this visit.There is no height or weight on file to calculate BMI.  General Appearance:  Unable to assess  Eye Contact:   Unable to assess  Speech:  Clear and Coherent and Normal Rate  Volume:  Normal  Mood:  Anxious  Affect:   Unable to assess  Thought Process:  Goal Directed and Descriptions of Associations: Circumstantial  Orientation:  Full (Time, Place, and Person)  Thought Content: Logical   Suicidal Thoughts:  No  Homicidal Thoughts:  No  Memory:  WNL  Judgement:  Good  Insight:  Good  Psychomotor Activity:   Unable to assess  Concentration:  Concentration: Good and Attention Span: Good  Recall:  Good  Fund of Knowledge: Good  Language: Good  Assets:   Desire for Improvement Financial Resources/Insurance Housing Transportation Vocational/Educational  ADL's:  Intact  Cognition: WNL  Prognosis:  Good   DIAGNOSES:    ICD-10-CM   1. Bipolar I disorder (HCC)  F31.9     2. Generalized anxiety disorder  F41.1     3. Vapes nicotine  containing substance  Z72.0     4. Stress due to illness of family member  Z63.79     5. Loss of job  Z56.0     6. Multiple sclerosis (HCC)  G35       Receiving Psychotherapy: No   RECOMMENDATIONS:  PDMP was reviewed.  Last Xanax  04/14/2023. I provided 30 minutes of non-face-to-face time during this encounter, including time spent before and after the visit in records review, medical decision making, counseling pertinent to today's visit, and charting.   We discussed the options for anxiety treatment.  I recommend increasing the Prozac .  I also recommend discontinuing Xanax  and starting Klonopin .  Hopefully the benefit  of the anxiolytic will last longer than the Xanax .  I gave her the option of bringing the remainder of the Xanax  to us  for appropriate disposal but she prefers to go ahead and finish out the 2 weeks that she has left and then we will start Klonopin .  That is completely fine.  She will call the office on or around 05/10/2023 requesting the Klonopin , I will send in the prescription and the Xanax  refills will be discontinued.  When she calls, send in Klonopin  1 mg #90 1 tid prn which will be due 05/14/2023.  For now she will continue the Xanax .  Discussed cessation of vaping.  Continue Xanax  1 mg to 1 p.o. 3 times daily as needed. Increase Prozac  to 40 mg daily. Continue Lamictal  150 mg twice daily. Continue Risperdal  4 mg nightly. Recommend counseling. Return in 6 weeks.  Verneita Cooks, PA-C

## 2023-05-10 ENCOUNTER — Telehealth: Payer: Self-pay | Admitting: Physician Assistant

## 2023-05-10 ENCOUNTER — Ambulatory Visit: Payer: Self-pay | Admitting: General Practice

## 2023-05-10 NOTE — Telephone Encounter (Signed)
Next appt is 06/20/23. Requesting refill on Klonopin called to:  [id number] [place], [place] - [address]    Phone: [phone]  Fax: [phone]

## 2023-05-11 ENCOUNTER — Other Ambulatory Visit: Payer: Self-pay

## 2023-05-11 NOTE — Telephone Encounter (Signed)
Pened clonazepam 1 mg #90 tid to [facility]

## 2023-05-14 MED ORDER — CLONAZEPAM 1 MG PO TABS
1.0000 mg | ORAL_TABLET | Freq: Three times a day (TID) | ORAL | 0 refills | Status: DC | PRN
Start: 2023-05-14 — End: 2023-06-15

## 2023-06-05 ENCOUNTER — Ambulatory Visit (INDEPENDENT_AMBULATORY_CARE_PROVIDER_SITE_OTHER): Payer: BC Managed Care – PPO | Admitting: General Practice

## 2023-06-05 ENCOUNTER — Encounter: Payer: Self-pay | Admitting: General Practice

## 2023-06-05 VITALS — BP 110/70 | HR 85 | Temp 97.9°F | Ht 65.5 in | Wt 193.0 lb

## 2023-06-05 DIAGNOSIS — E0590 Thyrotoxicosis, unspecified without thyrotoxic crisis or storm: Secondary | ICD-10-CM

## 2023-06-05 DIAGNOSIS — Z23 Encounter for immunization: Secondary | ICD-10-CM | POA: Diagnosis not present

## 2023-06-05 DIAGNOSIS — I2699 Other pulmonary embolism without acute cor pulmonale: Secondary | ICD-10-CM

## 2023-06-05 DIAGNOSIS — R269 Unspecified abnormalities of gait and mobility: Secondary | ICD-10-CM | POA: Diagnosis not present

## 2023-06-05 DIAGNOSIS — Z7289 Other problems related to lifestyle: Secondary | ICD-10-CM | POA: Insufficient documentation

## 2023-06-05 DIAGNOSIS — G35 Multiple sclerosis: Secondary | ICD-10-CM

## 2023-06-05 DIAGNOSIS — I10 Essential (primary) hypertension: Secondary | ICD-10-CM

## 2023-06-05 DIAGNOSIS — N951 Menopausal and female climacteric states: Secondary | ICD-10-CM | POA: Insufficient documentation

## 2023-06-05 DIAGNOSIS — F319 Bipolar disorder, unspecified: Secondary | ICD-10-CM | POA: Diagnosis not present

## 2023-06-05 DIAGNOSIS — L304 Erythema intertrigo: Secondary | ICD-10-CM | POA: Insufficient documentation

## 2023-06-05 DIAGNOSIS — Z7689 Persons encountering health services in other specified circumstances: Secondary | ICD-10-CM | POA: Insufficient documentation

## 2023-06-05 DIAGNOSIS — Z124 Encounter for screening for malignant neoplasm of cervix: Secondary | ICD-10-CM

## 2023-06-05 MED ORDER — NYSTATIN 100000 UNIT/GM EX CREA: 1.0000 | TOPICAL_CREAM | Freq: Two times a day (BID) | CUTANEOUS | 0 refills | Status: DC

## 2023-06-05 NOTE — Assessment & Plan Note (Signed)
 Concerned for perimenopause.  LMP was sometime last year.  No symptoms.  Would like to establish with gyn to discuss menopause labs and pap. Referral placed.

## 2023-06-05 NOTE — Patient Instructions (Addendum)
 You will either be contacted via phone regarding your referral to gynecology , or you may receive a letter on your MyChart portal from our referral team with instructions for scheduling an appointment. Please let us know if you have not been contacted by anyone within two weeks.  Schedule physical and labs in 6 weeks.   It was a pleasure meeting you!

## 2023-06-05 NOTE — Assessment & Plan Note (Signed)
 Controlled.  Followed by psychiatry.  Continued medications as per psychiatry.

## 2023-06-05 NOTE — Assessment & Plan Note (Signed)
 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   No excoriation or bleeding or open areas.   Discussed wire free bra, keeping area dry.   Rx sent for nystatin cream to apply BID.

## 2023-06-05 NOTE — Progress Notes (Signed)
 New Patient Office Visit  Subjective    Patient ID: [name], female    DOB: Jan 01, 1976  Age: 48 y.o. MRN: [id number]  CC:  Chief Complaint  Patient presents with   New Patient (Initial Visit)    HPI [name] is a 48 y.o. female presents to establish care.  Previous PCP/physical/labs: Angela Cox MD, no physical in the last couple years. Blood work at hematology office last year.  History of HTN/Pulmonary Embolus/Hx of Hypotension: pulmonary embolism last year. Completed Xarelto. Currently still vapes. Currently takes Asprin 81 mg once daily.   Hyperthyroidism: Chronic. Last tsh was checked in 2023. No concerns today. Has had radioactive therapy in the past.  M.S.- following with neurology, [facility]. Ocrevus every 6 months. Symptoms worse in the morning. She has a bars installed at home. She does scoot down the stairs to avoid falls. Has Baclofen 10 mg as needed.  Bipolar disorder- followed  by psychiatry, Melony Overly, PA. Currently managed on Klonopin 1 mg three times a day and Risperidone 4 mg once daily, Prozac 40 mg once daily and Lamictal 300 mg once daily.   Rash under both breast: has been there for a year. Itching and painful at time. She has been using the OTC antimicrobial ointment which does help sometimes. She does wear a wired bra.   Outpatient Encounter Medications as of 06/05/2023  Medication Sig   aspirin EC 81 MG tablet Take 81 mg by mouth daily. Swallow whole.   baclofen (LIORESAL) 10 MG tablet TAKE 0.5-1 TABLETS (5-10 MG TOTAL) BY MOUTH 3 (THREE) TIMES DAILY.   clonazePAM (KLONOPIN) 1 MG tablet Take 1 tablet (1 mg total) by mouth 3 (three) times daily as needed for anxiety.   FLUoxetine (PROZAC) 40 MG capsule Take 1 capsule (40 mg total) by mouth daily.   lamoTRIgine (LAMICTAL) 150 MG tablet Take 2 tablets (300 mg total) by mouth at bedtime.   nystatin cream (MYCOSTATIN) Apply 1 Application topically 2 (two) times daily.   ocrelizumab  (OCREVUS) 300 MG/10ML injection Inject into the vein once. Initial dose: 300mg  IV on day 1 and repeat day 15. Maintenance: 600mg  IV q 6months   risperidone (RISPERDAL) 4 MG tablet Take 1 tablet (4 mg total) by mouth daily.   [DISCONTINUED] ALPRAZolam (XANAX) 1 MG tablet TAKE 1 TABLET BY MOUTH THREE TIMES A DAY AS NEEDED FOR ANXIETY   [DISCONTINUED] folic acid (FOLVITE) 1 MG tablet Take 1 mg by mouth daily. (Patient not taking: Reported on 04/30/2023)   [DISCONTINUED] omeprazole (PRILOSEC) 40 MG capsule TAKE 1 CAPSULE (40 MG TOTAL) BY MOUTH DAILY. (Patient not taking: Reported on 04/30/2023)   [DISCONTINUED] oxybutynin (DITROPAN) 5 MG tablet Take 1 tablet (5 mg total) by mouth 3 (three) times daily. (Patient not taking: Reported on 04/30/2023)   [DISCONTINUED] vitamin B-12 (CYANOCOBALAMIN) 1000 MCG tablet Take 1,000 mcg by mouth daily. (Patient not taking: Reported on 04/30/2023)   No facility-administered encounter medications on file as of 06/05/2023.    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        Vitamin B12 deficiency 10/07/2021   Vitamin D deficiency 10/07/2021    Past Surgical History:  Procedure Laterality Date   CESAREAN SECTION  x 2    Family History  Problem Relation Age of Onset   High blood pressure Mother    Stroke Maternal Grandfather    Diabetes Neg Hx     Social History   Socioeconomic History   Marital status: Married    Spouse name: Loraine Leriche   Number of children: 2   Years of education: Not  on file   Highest education level: Bachelor's degree (e.g., BA, AB, BS)  Occupational History   Occupation: accounts payable  Tobacco Use   Smoking status: Every Day    Types: E-cigarettes   Smokeless tobacco: Never   Tobacco comments:    vapes q 2 hours or so. Used to smoke 1 ppd 8 years ago.   Vaping Use   Vaping status: Every Day   Substances: Nicotine  Substance and Sexual Activity   Alcohol use: Never   Drug use: Not Currently    Comment: In her 73s.   Sexual activity: Yes    Birth control/protection: Surgical  Other Topics Concern   Not on file  Social History Narrative   Live at home with husband and 2 children   Right handed   Caffeine: 1 C of coffee a day, occ a soda a day   Social Drivers of Corporate investment banker Strain: Not on file  Food Insecurity: Not on file  Transportation Needs: Not on file  Physical Activity: Not on file  Stress: Not on file  Social Connections: Not on file  Intimate Partner Violence: Not on file    Review of Systems  Constitutional:  Negative for chills and fever.  Respiratory:  Negative for shortness of breath.   Gastrointestinal:  Negative for abdominal pain, constipation, diarrhea, nausea and vomiting.  Genitourinary:  Negative for dysuria, frequency and urgency.  Skin:  Positive for rash.  Neurological:  Negative for dizziness and headaches.  Psychiatric/Behavioral:  Negative for depression. The patient is not nervous/anxious.         Objective    BP 110/70 (BP Location: Left Arm, Patient Position: Sitting, Cuff Size: Normal)   Pulse 85   Temp 97.9 F (36.6 C) (Oral)   Ht 5' 5.5" (1.664 m)   Wt 193 lb (87.5 kg)   LMP  (Approximate) Comment: sometime in 2024  SpO2 95%   BMI 31.63 kg/m   Physical Exam Vitals and nursing note reviewed.  Constitutional:      Appearance: Normal appearance.  Cardiovascular:     Rate and Rhythm: Normal rate and regular rhythm.     Pulses: Normal pulses.     Heart sounds:  Normal heart sounds.  Pulmonary:     Effort: Pulmonary effort is normal.     Breath sounds: Normal breath sounds.  Skin:    General: Skin is warm.     Findings: Erythema present.     Comments: Moist associated dermatitis present under both breast.  Neurological:     Mental Status: She is alert and oriented to person, place, and time.  Psychiatric:        Mood and Affect: Mood normal.        Behavior: Behavior normal.        Thought Content: Thought content  normal.        Judgment: Judgment normal.         Assessment & Plan:  Peri-menopause Assessment & Plan: Concerned for perimenopause.  LMP was sometime last year.  No symptoms.  Would like to establish with gyn to discuss menopause labs and pap. Referral placed.   Orders: -     Ambulatory referral to Gynecology  Cervical cancer screening -     Ambulatory referral to Gynecology  Need for pneumococcal 20-valent conjugate vaccination -     Pneumococcal conjugate vaccine 20-valent  Bipolar 1 disorder (HCC) Assessment & Plan: Controlled.  Followed by psychiatry.  Continued medications as per psychiatry.   Gait disturbance Assessment & Plan: Secondary to MS.   HYPERTENSION Assessment & Plan: Controlled.    Thyrotoxicosis without thyroid storm, unspecified thyrotoxicosis type Assessment & Plan: Chronic. No recent episodes.  Will check TSH at next visit.   Multiple sclerosis (HCC) Assessment & Plan: Controlled. Followed by neurology.  Continue meds as per neurology.   Pulmonary embolism, unspecified chronicity, unspecified pulmonary embolism type, unspecified whether acute cor pulmonale present [facility]) Assessment & Plan: Hx.  Still vapes, discussed cessation.  Continue asprin 81 mg once daily.   Establishing care with new doctor, encounter for Assessment & Plan: EMR reviewed briefly.   Pruritic intertrigo Assessment & Plan: Moisture associated dermatitis on exam.   No excoriation or bleeding  or open areas.   Discussed wire free bra, keeping area dry.   Rx sent for nystatin cream to apply BID.   Orders: -     Nystatin; Apply 1 Application topically 2 (two) times daily.  Dispense: 60 g; Refill: 0  Current every day vaping Assessment & Plan: Discussed cessation.  At this time, she is not interested in quitting.  Has tried chantix in the past but had vivid dreams.       Return in about 6 weeks (around 07/17/2023) for physical and tetanus shot.   [place] Charon, NP

## 2023-06-05 NOTE — Assessment & Plan Note (Signed)
 Hx.  Still vapes, discussed cessation.  Continue asprin 81 mg once daily.

## 2023-06-05 NOTE — Assessment & Plan Note (Signed)
 Discussed cessation.  At this time, she is not interested in quitting.  Has tried chantix in the past but had vivid dreams.

## 2023-06-05 NOTE — Assessment & Plan Note (Signed)
 Controlled.

## 2023-06-05 NOTE — Assessment & Plan Note (Signed)
 Chronic. No recent episodes.  Will check TSH at next visit.

## 2023-06-05 NOTE — Assessment & Plan Note (Signed)
 EMR reviewed briefly.

## 2023-06-05 NOTE — Assessment & Plan Note (Signed)
 Secondary to MS.

## 2023-06-05 NOTE — Assessment & Plan Note (Signed)
 Controlled. Followed by neurology.  Continue meds as per neurology.

## 2023-06-14 ENCOUNTER — Other Ambulatory Visit: Payer: Self-pay | Admitting: Physician Assistant

## 2023-06-20 ENCOUNTER — Ambulatory Visit (INDEPENDENT_AMBULATORY_CARE_PROVIDER_SITE_OTHER): Payer: BC Managed Care – PPO | Admitting: Physician Assistant

## 2023-06-20 DIAGNOSIS — Z91199 Patient's noncompliance with other medical treatment and regimen due to unspecified reason: Secondary | ICD-10-CM

## 2023-06-20 NOTE — Progress Notes (Signed)
 No show

## 2023-06-21 ENCOUNTER — Encounter: Payer: Self-pay | Admitting: General Practice

## 2023-06-21 ENCOUNTER — Other Ambulatory Visit: Payer: Self-pay | Admitting: General Practice

## 2023-06-21 DIAGNOSIS — L304 Erythema intertrigo: Secondary | ICD-10-CM

## 2023-06-21 MED ORDER — KETOCONAZOLE 2 % EX CREA: 1.0000 | TOPICAL_CREAM | Freq: Two times a day (BID) | CUTANEOUS | 0 refills | Status: DC

## 2023-06-22 ENCOUNTER — Encounter: Payer: Self-pay | Admitting: Physician Assistant

## 2023-06-22 ENCOUNTER — Ambulatory Visit: Admitting: Physician Assistant

## 2023-06-22 DIAGNOSIS — F319 Bipolar disorder, unspecified: Secondary | ICD-10-CM | POA: Diagnosis not present

## 2023-06-22 DIAGNOSIS — F4323 Adjustment disorder with mixed anxiety and depressed mood: Secondary | ICD-10-CM | POA: Diagnosis not present

## 2023-06-22 DIAGNOSIS — R69 Illness, unspecified: Secondary | ICD-10-CM | POA: Diagnosis not present

## 2023-06-22 MED ORDER — CLONAZEPAM 1 MG PO TABS: 1.0000 mg | ORAL_TABLET | Freq: Three times a day (TID) | ORAL | 1 refills | Status: DC | PRN

## 2023-06-22 MED ORDER — FLUOXETINE HCL 20 MG PO CAPS: 60.0000 mg | ORAL_CAPSULE | Freq: Every day | ORAL | 1 refills | Status: DC

## 2023-06-22 NOTE — Progress Notes (Signed)
 [facility] Check  Patient ID: [name],  MRN: [id number]  PCP: [place] Charon, NP  Date of Evaluation: 06/22/2023 Time spent:25 minutes  Chief Complaint:  Chief Complaint   Anxiety; Depression; Follow-up    Virtual Visit via Telehealth  I connected with patient by telephone, with their informed consent, and verified patient privacy and that I am speaking with the correct person using two identifiers.  I am private, in my office and the patient is at home.  I discussed the limitations, risks, security and privacy concerns of performing an evaluation and management service by telephone and the availability of in person appointments. I also discussed with the patient that there may be a patient responsible charge related to this service. The patient expressed understanding and agreed to proceed.   I discussed the assessment and treatment plan with the patient. The patient was provided an opportunity to ask questions and all were answered. The patient agreed with the plan and demonstrated an understanding of the instructions.   The patient was advised to call back or seek an in-person evaluation if the symptoms worsen or if the condition fails to improve as anticipated.  I provided 25 minutes of non-face-to-face time during this encounter.  HISTORY/CURRENT STATUS: For follow-up after a med change.  At the last visit we increased the Prozac.  Also Klonopin was started since that visit and Xanax discontinued.  She states the anxiety is approximately 25% better.  The Klonopin lasts longer and does not make her feel as tired.  Not having panic attacks just gets overwhelmed.    States she is a little depressed, she is still looking for a job but has not had any luck yet.  She is still trying.  Energy and motivation are fair to good depending on the day.  ADLs and personal hygiene are normal.  Appetite is normal and weight is stable.  She does not cry easily.  Denies suicidal or  homicidal thoughts.  Patient denies increased energy with decreased need for sleep, increased talkativeness, racing thoughts, impulsivity or risky behaviors, increased spending, increased libido, grandiosity, increased irritability or anger, paranoia, or hallucinations.  Denies dizziness, syncope, seizures, numbness, tingling, tremor, tics, unsteady gait, slurred speech, confusion. Denies muscle or joint pain, stiffness, or dystonia. Denies unexplained weight loss, frequent infections, or sores that heal slowly.  No polyphagia, polydipsia, or polyuria. Denies visual changes or paresthesias.   Individual Medical History/ Review of Systems: Changes? :No      Past medications for mental health diagnoses include: Lithium, Wellbutrin was not effective for depression or decreased desire for nicotine, Risperdal, Lamictal, Ativan, Chantix caused paranoia and increased anger, BuSpar made her dizzy  Allergies: Rituximab  Current Medications:  Current Outpatient Medications:    aspirin EC 81 MG tablet, Take 81 mg by mouth daily. Swallow whole., Disp: , Rfl:    baclofen (LIORESAL) 10 MG tablet, TAKE 0.5-1 TABLETS (5-10 MG TOTAL) BY MOUTH 3 (THREE) TIMES DAILY., Disp: 270 tablet, Rfl: 1   FLUoxetine (PROZAC) 20 MG capsule, Take 3 capsules (60 mg total) by mouth daily., Disp: 90 capsule, Rfl: 1   ketoconazole (NIZORAL) 2 % cream, Apply 1 Application topically 2 (two) times daily., Disp: 60 g, Rfl: 0   lamoTRIgine (LAMICTAL) 150 MG tablet, Take 2 tablets (300 mg total) by mouth at bedtime., Disp: 180 tablet, Rfl: 1   nystatin cream (MYCOSTATIN), Apply 1 Application topically 2 (two) times daily., Disp: 60 g, Rfl: 0   ocrelizumab (OCREVUS) 300 MG/10ML injection,  Inject into the vein once. Initial dose: 300mg  IV on day 1 and repeat day 15. Maintenance: 600mg  IV q 6months, Disp: , Rfl:    risperidone (RISPERDAL) 4 MG tablet, Take 1 tablet (4 mg total) by mouth daily., Disp: 90 tablet, Rfl: 1   clonazePAM  (KLONOPIN) 1 MG tablet, Take 1 tablet (1 mg total) by mouth 3 (three) times daily as needed for anxiety., Disp: 90 tablet, Rfl: 1 Medication Side Effects: none  Family Medical/ Social History: Changes?   Her husband has cirrhosis.  Unable to work.  She lost her job since our last visit 2 months ago.  MENTAL HEALTH EXAM:  There were no vitals taken for this visit.There is no height or weight on file to calculate BMI.  General Appearance:  Unable to assess  Eye Contact:   Unable to assess  Speech:  Clear and Coherent and Normal Rate  Volume:  Normal  Mood:  Depressed  Affect:   Unable to assess  Thought Process:  Goal Directed and Descriptions of Associations: Circumstantial  Orientation:  Full (Time, Place, and Person)  Thought Content: Logical   Suicidal Thoughts:  No  Homicidal Thoughts:  No  Memory:  WNL  Judgement:  Good  Insight:  Good  Psychomotor Activity:   Unable to assess  Concentration:  Concentration: Good and Attention Span: Good  Recall:  Good  Fund of Knowledge: Good  Language: Good  Assets:  Desire for Improvement Financial Resources/Insurance Housing Transportation  ADL's:  Intact  Cognition: WNL  Prognosis:  Good   DIAGNOSES:    ICD-10-CM   1. Situational mixed anxiety and depressive disorder  F43.23     2. Chronic illness  R69     3. Bipolar I disorder (HCC)  F31.9       Receiving Psychotherapy: No   RECOMMENDATIONS:  PDMP was reviewed.  Last Klonopin filled 06/15/2023. I provided 25 minutes of non-face-to-face time during this encounter, including time spent before and after the visit in records review, medical decision making, counseling pertinent to today's visit, and charting.   We discussed her symptoms.  I am glad to hear she is doing better with the Klonopin versus Xanax.  I recommend continuing that.  She is having more situational depression however.  I recommend increasing the Prozac even further.  She understands and would like to do  so.  Continue Klonopin 1 mg, 1 p.o. 3 times daily as needed. Increase Prozac to 60 mg daily. Continue Lamictal 150 mg twice daily. Continue Risperdal 4 mg nightly. Recommend counseling. Return in 6-8 weeks.  Melony Overly, PA-C

## 2023-07-05 DIAGNOSIS — G35 Multiple sclerosis: Secondary | ICD-10-CM | POA: Diagnosis not present

## 2023-07-10 ENCOUNTER — Other Ambulatory Visit: Payer: Self-pay | Admitting: Neurology

## 2023-07-10 NOTE — Telephone Encounter (Signed)
 Last seen on 03/21/23 Follow up scheduled on 10/08/23

## 2023-07-15 ENCOUNTER — Other Ambulatory Visit: Payer: Self-pay | Admitting: Physician Assistant

## 2023-07-17 ENCOUNTER — Ambulatory Visit (INDEPENDENT_AMBULATORY_CARE_PROVIDER_SITE_OTHER): Payer: BC Managed Care – PPO | Admitting: General Practice

## 2023-07-17 ENCOUNTER — Encounter: Payer: Self-pay | Admitting: General Practice

## 2023-07-17 VITALS — BP 110/82 | HR 60 | Temp 98.0°F | Ht 65.5 in | Wt 192.0 lb

## 2023-07-17 DIAGNOSIS — E6609 Other obesity due to excess calories: Secondary | ICD-10-CM | POA: Diagnosis not present

## 2023-07-17 DIAGNOSIS — Z23 Encounter for immunization: Secondary | ICD-10-CM | POA: Diagnosis not present

## 2023-07-17 DIAGNOSIS — Z0000 Encounter for general adult medical examination without abnormal findings: Secondary | ICD-10-CM | POA: Insufficient documentation

## 2023-07-17 DIAGNOSIS — I2699 Other pulmonary embolism without acute cor pulmonale: Secondary | ICD-10-CM

## 2023-07-17 DIAGNOSIS — N951 Menopausal and female climacteric states: Secondary | ICD-10-CM

## 2023-07-17 DIAGNOSIS — E66811 Obesity, class 1: Secondary | ICD-10-CM | POA: Diagnosis not present

## 2023-07-17 DIAGNOSIS — E559 Vitamin D deficiency, unspecified: Secondary | ICD-10-CM

## 2023-07-17 DIAGNOSIS — E0590 Thyrotoxicosis, unspecified without thyrotoxic crisis or storm: Secondary | ICD-10-CM | POA: Diagnosis not present

## 2023-07-17 DIAGNOSIS — Z1211 Encounter for screening for malignant neoplasm of colon: Secondary | ICD-10-CM

## 2023-07-17 DIAGNOSIS — Z1231 Encounter for screening mammogram for malignant neoplasm of breast: Secondary | ICD-10-CM

## 2023-07-17 DIAGNOSIS — I10 Essential (primary) hypertension: Secondary | ICD-10-CM | POA: Diagnosis not present

## 2023-07-17 DIAGNOSIS — F319 Bipolar disorder, unspecified: Secondary | ICD-10-CM

## 2023-07-17 DIAGNOSIS — Z6831 Body mass index (BMI) 31.0-31.9, adult: Secondary | ICD-10-CM

## 2023-07-17 DIAGNOSIS — Z7289 Other problems related to lifestyle: Secondary | ICD-10-CM

## 2023-07-17 DIAGNOSIS — E538 Deficiency of other specified B group vitamins: Secondary | ICD-10-CM

## 2023-07-17 LAB — CBC
HCT: 43.1 % (ref 36.0–46.0)
Hemoglobin: 14.9 g/dL (ref 12.0–15.0)
MCHC: 34.5 g/dL (ref 30.0–36.0)
MCV: 94.4 fl (ref 78.0–100.0)
Platelets: 217 10*3/uL (ref 150.0–400.0)
RBC: 4.57 Mil/uL (ref 3.87–5.11)
RDW: 12.8 % (ref 11.5–15.5)
WBC: 6.8 10*3/uL (ref 4.0–10.5)

## 2023-07-17 LAB — COMPREHENSIVE METABOLIC PANEL WITH GFR
ALT: 14 U/L (ref 0–35)
AST: 14 U/L (ref 0–37)
Albumin: 4.6 g/dL (ref 3.5–5.2)
Alkaline Phosphatase: 115 U/L (ref 39–117)
BUN: 17 mg/dL (ref 6–23)
CO2: 28 meq/L (ref 19–32)
Calcium: 10.5 mg/dL (ref 8.4–10.5)
Chloride: 106 meq/L (ref 96–112)
Creatinine, Ser: 0.97 mg/dL (ref 0.40–1.20)
GFR: 68.93 mL/min (ref >60.00)
Glucose, Bld: 78 mg/dL (ref 70–99)
Potassium: 4.9 meq/L (ref 3.5–5.1)
Sodium: 139 meq/L (ref 135–145)
Total Bilirubin: 0.5 mg/dL (ref 0.2–1.2)
Total Protein: 7.3 g/dL (ref 6.0–8.3)

## 2023-07-17 LAB — LIPID PANEL
Cholesterol: 165 mg/dL (ref 0–200)
HDL: 54.8 mg/dL (ref >39.00)
LDL Cholesterol: 100 mg/dL — ABNORMAL HIGH (ref 0–99)
NonHDL: 110.37
Total CHOL/HDL Ratio: 3
Triglycerides: 50 mg/dL (ref 0.0–149.0)
VLDL: 10 mg/dL (ref 0.0–40.0)

## 2023-07-17 LAB — VITAMIN B12: Vitamin B-12: 236 pg/mL (ref 211–911)

## 2023-07-17 LAB — TSH: TSH: 2.09 u[IU]/mL (ref 0.35–5.50)

## 2023-07-17 LAB — VITAMIN D 25 HYDROXY (VIT D DEFICIENCY, FRACTURES): VITD: 12.44 ng/mL — ABNORMAL LOW (ref 30.00–100.00)

## 2023-07-17 LAB — HEMOGLOBIN A1C: Hgb A1c MFr Bld: 5.1 % (ref 4.6–6.5)

## 2023-07-17 NOTE — Assessment & Plan Note (Signed)
 Smoking cessation instruction/counseling given:  counseled patient on the dangers of tobacco use, advised patient to stop smoking, and reviewed strategies to maximize success

## 2023-07-17 NOTE — Assessment & Plan Note (Signed)
 BP at goal today.  Continue off meds.

## 2023-07-17 NOTE — Assessment & Plan Note (Signed)
 Controlled. Followed by psychiatry.  Continue medications as per psychiatry.

## 2023-07-17 NOTE — Progress Notes (Addendum)
 Established Patient Office Visit  Subjective   Patient ID: [name], female    DOB: 27-Jan-1976  Age: 48 y.o. MRN: [id number]  Chief Complaint  Patient presents with   Annual Exam    HPI  [name] is a 48 year old female with past medical history of HTN, pulmonary embolus, hyperthyroidism, MS, Bipolar disorder 1, vaping use presents today for complete physical and follow up of chronic conditions.  Immunizations: -Tetanus: Completed in 2013; due -Influenza: completed this season -Pneumonia: Completed   Diet: Fair diet. She does eat fast food often. Exercise: no regular exercise due to M.S.  Eye exam: Completes annually  Dental exam: Completed several year ago.   Pap Smear: due; referral placed for GYN for pap smear and to discuss menopause. Mammogram: due  Colonoscopy: due  MS: Followed by neurology.  Currently managed on baclofen.  No concerns today.  Bipolar 1 disorder: Currently managed on Klonopin 1 mg as needed, Prozac 20 mg daily, Lamictal 300 mg daily, Risperdal 4 mg daily.  Followed by psychiatry.  No concerns today.   Patient Active Problem List   Diagnosis Date Noted   Encounter for screening and preventative care 07/17/2023   Class 1 obesity due to excess calories with body mass index (BMI) of 31.0 to 31.9 in adult 07/17/2023   Peri-menopause 06/05/2023   Pruritic intertrigo 06/05/2023   Current every day vaping 06/05/2023   Pulmonary embolus (HCC) 10/07/2021   Gait disturbance 05/23/2021   Multiple sclerosis (HCC) 04/20/2021   HYPERTHYROIDISM 02/14/2006   HYPERTENSION 02/14/2006   Bipolar 1 disorder (HCC) 01/11/2006   Past Medical History:  Diagnosis Date   Abnormal ultrasound of parathyroid gland    Anxiety    Bipolar 1 disorder (HCC)    Clotting disorder (HCC)    Deficiency anemia 11/14/2021   Depression    Folate deficiency 10/07/2021   Hepatitis C    High risk medication use 05/23/2021   Hypercalcemia 04/27/2022   Numbness  05/23/2021   Thyroid nodule greater than or equal to 1 cm in diameter incidentally noted on imaging study    Toxic diffuse goiter 01/11/2006   Qualifier: Diagnosis of   By: Candis Musa MD, Ruben Reason.        Vitamin B12 deficiency 10/07/2021   Vitamin D deficiency 10/07/2021   Past Surgical History:  Procedure Laterality Date   CESAREAN SECTION  x 2   Allergies  Allergen Reactions   Rituximab Other (See Comments)    Patient had hypersensitivity reaction during first administration. Please see progress notes dated 10/24/2021.         07/17/2023    8:38 AM 06/05/2023    9:25 AM  Depression screen PHQ 2/9  Decreased Interest 0 0  Down, Depressed, Hopeless 0 0  PHQ - 2 Score 0 0  Altered sleeping 0 0  Tired, decreased energy 1 1  Change in appetite 0 0  Feeling bad or failure about yourself  0 1  Trouble concentrating 0 0  Moving slowly or fidgety/restless 0 0  Suicidal thoughts 0 0  PHQ-9 Score 1 2  Difficult doing work/chores Not difficult at all Not difficult at all       07/17/2023    8:38 AM 06/05/2023    9:26 AM  GAD 7 : Generalized Anxiety Score  Nervous, Anxious, on Edge 1 1  Control/stop worrying 1 1  Worry too much - different things 1 1  Trouble relaxing 0 0  Restless 0 0  Easily annoyed or irritable 0 0  Afraid - awful might happen 0 0  Total GAD 7 Score 3 3  Anxiety Difficulty Not difficult at all Not difficult at all      Review of Systems  Constitutional:  Negative for chills, fever, malaise/fatigue and weight loss.  HENT:  Negative for congestion, ear discharge, ear pain, hearing loss, nosebleeds, sinus pain, sore throat and tinnitus.   Eyes:  Negative for blurred vision, double vision, pain, discharge and redness.  Respiratory:  Negative for cough, shortness of breath, wheezing and stridor.   Cardiovascular:  Negative for chest pain, palpitations and leg swelling.  Gastrointestinal:  Negative for abdominal pain, constipation, diarrhea, heartburn, nausea  and vomiting.  Genitourinary:  Negative for dysuria, frequency and urgency.  Musculoskeletal:  Negative for myalgias.  Skin:  Negative for rash.  Neurological:  Negative for dizziness, tingling, seizures, weakness and headaches.  Psychiatric/Behavioral:  Negative for depression, substance abuse and suicidal ideas. The patient is not nervous/anxious.       Objective:     BP 110/82 (BP Location: Left Arm, Patient Position: Sitting, Cuff Size: Normal)   Pulse 60   Temp 98 F (36.7 C) (Oral)   Ht 5' 5.5" (1.664 m)   Wt 192 lb (87.1 kg)   SpO2 97%   BMI 31.46 kg/m  BP Readings from Last 3 Encounters:  07/17/23 110/82  06/05/23 110/70  03/21/23 117/79   Wt Readings from Last 3 Encounters:  07/17/23 192 lb (87.1 kg)  06/05/23 193 lb (87.5 kg)  03/21/23 189 lb 8 oz (86 kg)      Physical Exam Vitals and nursing note reviewed.  Constitutional:      Appearance: Normal appearance.  HENT:     Head: Normocephalic and atraumatic.     Right Ear: Tympanic membrane, ear canal and external ear normal.     Left Ear: Tympanic membrane, ear canal and external ear normal.     Nose: Nose normal.     Mouth/Throat:     Mouth: Mucous membranes are moist.     Pharynx: Oropharynx is clear.  Eyes:     Conjunctiva/sclera: Conjunctivae normal.     Pupils: Pupils are equal, round, and reactive to light.  Cardiovascular:     Rate and Rhythm: Normal rate and regular rhythm.     Pulses: Normal pulses.     Heart sounds: Normal heart sounds.  Pulmonary:     Effort: Pulmonary effort is normal.     Breath sounds: Normal breath sounds.  Abdominal:     General: Abdomen is flat. Bowel sounds are normal.     Palpations: Abdomen is soft.  Musculoskeletal:        General: Normal range of motion.     Cervical back: Normal range of motion.  Skin:    General: Skin is warm and dry.     Capillary Refill: Capillary refill takes less than 2 seconds.  Neurological:     General: No focal deficit present.      Mental Status: She is alert and oriented to person, place, and time. Mental status is at baseline.  Psychiatric:        Mood and Affect: Mood normal.        Behavior: Behavior normal.        Thought Content: Thought content normal.        Judgment: Judgment normal.      No results found for any visits on 07/17/23.     The  ASCVD Risk score (Arnett DK, et al., 2019) failed to calculate for the following reasons:   Cannot find a previous HDL lab   Cannot find a previous total cholesterol lab    Assessment & Plan:  Encounter for screening and preventative care Assessment & Plan: Immunizations tetanus updated today. Influenza and pneumonia vaccines uptodate Pap smear due Mammogram due, orders placed. Colonoscopy due   Discussed the importance of a healthy diet and regular exercise in order for weight loss, and to reduce the risk of further co-morbidity.  Exam stable. Labs pending.  Follow up in 1 year for repeat physical.   Orders: -     CBC -     Comprehensive metabolic panel with GFR -     Lipid panel -     TSH -     Hemoglobin A1c  Need for Tdap vaccination -     Tdap vaccine greater than or equal to 7yo IM  Class 1 obesity due to excess calories with body mass index (BMI) of 31.0 to 31.9 in adult, unspecified whether serious comorbidity present Assessment & Plan: Discussed the importance of monitoring diet and exercise.  Labs pending to rule out metabolic cause.  Orders: -     CBC -     Comprehensive metabolic panel with GFR -     Lipid panel -     TSH -     Hemoglobin A1c  Encounter for screening mammogram for malignant neoplasm of breast -     3D Screening Mammogram, Left and Right; Future  Screening for colon cancer -     Cologuard  Thyrotoxicosis without thyroid storm, unspecified thyrotoxicosis type Assessment & Plan: Repeat TSH pending.  Orders: -     TSH  Vitamin D deficiency -     VITAMIN D 25 Hydroxy (Vit-D Deficiency,  Fractures)  Vitamin B 12 deficiency -     Vitamin B12  Bipolar 1 disorder (HCC) Assessment & Plan: Controlled. Followed by psychiatry.  Continue medications as per psychiatry.    Current every day vaping Assessment & Plan: Smoking cessation instruction/counseling given:  counseled patient on the dangers of tobacco use, advised patient to stop smoking, and reviewed strategies to maximize success    HYPERTENSION Assessment & Plan: BP at goal today.  Continue off meds.   Peri-menopause Assessment & Plan: No symptoms. Still waiting to hear back from GYN. Phone number provided today to call make an appointment. She is also due for Pap smear she will schedule with them.   Other pulmonary embolism without acute cor pulmonale, unspecified chronicity (HCC) Assessment & Plan: Continue aspirin 81 mg once daily. Discussed cessation of vaping.      Return in about 6 months (around 01/16/2024) for chronic care management.Modesto Charon, NP

## 2023-07-17 NOTE — Assessment & Plan Note (Signed)
 No symptoms. Still waiting to hear back from GYN. Phone number provided today to call make an appointment. She is also due for Pap smear she will schedule with them.

## 2023-07-17 NOTE — Assessment & Plan Note (Signed)
Repeat TSH pending

## 2023-07-17 NOTE — Assessment & Plan Note (Signed)
 Discussed the importance of monitoring diet and exercise.   Labs pending to rule out metabolic cause.

## 2023-07-17 NOTE — Patient Instructions (Signed)
 Stop by the lab prior to leaving today. I will notify you of your results once received.   Schedule appt with GYN for pap.   Mammogram has been scheduled.   Cologuard ordered and will be sent to you in the mail.   It was a pleasure to see you today!

## 2023-07-17 NOTE — Assessment & Plan Note (Signed)
 Immunizations tetanus updated today. Influenza and pneumonia vaccines uptodate Pap smear due Mammogram due, orders placed. Colonoscopy due   Discussed the importance of a healthy diet and regular exercise in order for weight loss, and to reduce the risk of further co-morbidity.  Exam stable. Labs pending.  Follow up in 1 year for repeat physical.

## 2023-07-17 NOTE — Assessment & Plan Note (Signed)
 Continue aspirin 81 mg once daily. Discussed cessation of vaping.

## 2023-07-20 ENCOUNTER — Encounter: Payer: Self-pay | Admitting: General Practice

## 2023-07-20 DIAGNOSIS — E559 Vitamin D deficiency, unspecified: Secondary | ICD-10-CM

## 2023-07-20 MED ORDER — VITAMIN D (ERGOCALCIFEROL) 1.25 MG (50000 UNIT) PO CAPS: 50000.0000 [IU] | ORAL_CAPSULE | ORAL | 0 refills | Status: DC

## 2023-07-28 ENCOUNTER — Other Ambulatory Visit: Payer: Self-pay | Admitting: Physician Assistant

## 2023-07-30 DIAGNOSIS — Z1211 Encounter for screening for malignant neoplasm of colon: Secondary | ICD-10-CM | POA: Diagnosis not present

## 2023-08-01 ENCOUNTER — Ambulatory Visit: Admitting: Obstetrics and Gynecology

## 2023-08-06 LAB — COLOGUARD: COLOGUARD: NEGATIVE

## 2023-08-10 ENCOUNTER — Encounter: Payer: Self-pay | Admitting: Physician Assistant

## 2023-08-10 ENCOUNTER — Ambulatory Visit: Admitting: Physician Assistant

## 2023-08-10 DIAGNOSIS — F319 Bipolar disorder, unspecified: Secondary | ICD-10-CM

## 2023-08-10 DIAGNOSIS — R69 Illness, unspecified: Secondary | ICD-10-CM

## 2023-08-10 DIAGNOSIS — F4323 Adjustment disorder with mixed anxiety and depressed mood: Secondary | ICD-10-CM

## 2023-08-10 DIAGNOSIS — Z6379 Other stressful life events affecting family and household: Secondary | ICD-10-CM

## 2023-08-10 MED ORDER — FLUOXETINE HCL 20 MG PO CAPS: 60.0000 mg | ORAL_CAPSULE | Freq: Every day | ORAL | 1 refills | Status: DC

## 2023-08-10 MED ORDER — CLONAZEPAM 1 MG PO TABS: 1.0000 mg | ORAL_TABLET | Freq: Three times a day (TID) | ORAL | 5 refills | Status: DC | PRN

## 2023-08-10 MED ORDER — LAMOTRIGINE 200 MG PO TABS: 200.0000 mg | ORAL_TABLET | Freq: Two times a day (BID) | ORAL | 1 refills | Status: DC

## 2023-08-10 MED ORDER — RISPERIDONE 4 MG PO TABS: 4.0000 mg | ORAL_TABLET | Freq: Every day | ORAL | 1 refills | Status: DC

## 2023-08-10 NOTE — Progress Notes (Signed)
 [facility]  Patient ID: [name],  MRN: [id number]  PCP: Jolanda Nation, NP  Date of Evaluation: 08/10/2023 Time spent:30 minutes  Chief Complaint:  Chief Complaint   Anxiety; Depression; Follow-up    Virtual Visit via Telehealth  I connected with patient by telephone, with their informed consent, and verified patient privacy and that I am speaking with the correct person using two identifiers.  I am private, in my office and the patient is at home.  I discussed the limitations, risks, security and privacy concerns of performing an evaluation and management service by telephone and the availability of in person appointments. I also discussed with the patient that there may be a patient responsible charge related to this service. The patient expressed understanding and agreed to proceed.   I discussed the assessment and treatment plan with the patient. The patient was provided an opportunity to ask questions and all were answered. The patient agreed with the plan and demonstrated an understanding of the instructions.   The patient was advised to call back or seek an in-person evaluation if the symptoms worsen or if the condition fails to improve as anticipated.  I provided 30 minutes of non-face-to-face time during this encounter.  HISTORY/CURRENT STATUS: For routine follow-up.  We increased the Prozac  a few months ago.  She has not been able to tell much of a difference.  She is not having anxiety as much, the Klonopin  is really helpful with that.  She is very stressed since her husband was diagnosed with cirrhosis.  He has been told he may not have over 5 years to live.  She is having trouble finding a job, so very down about that.  Has a hard time enjoying things.  Energy and motivation are fair to good depending on the day.  Appetite is normal and weight is stable.  ADLs and personal hygiene are normal.  No suicidal or homicidal thoughts.  Patient denies increased  energy with decreased need for sleep, increased talkativeness, racing thoughts, impulsivity or risky behaviors, increased spending, increased libido, grandiosity, increased irritability or anger, paranoia, or hallucinations.  Denies dizziness, syncope, seizures, numbness, tingling, tremor, tics, unsteady gait, slurred speech, confusion. Denies muscle or joint pain, stiffness, or dystonia. Denies unexplained weight loss, frequent infections, or sores that heal slowly.  No polyphagia, polydipsia, or polyuria. Denies visual changes or paresthesias.   Individual Medical History/ Review of Systems: Changes? :No      Past medications for mental health diagnoses include: Lithium, Wellbutrin was not effective for depression or decreased desire for nicotine , Risperdal , Lamictal , Ativan , Chantix caused paranoia and increased anger, BuSpar  made her dizzy  Allergies: Rituximab   Current Medications:  Current Outpatient Medications:    aspirin EC 81 MG tablet, Take 81 mg by mouth daily. Swallow whole., Disp: , Rfl:    baclofen  (LIORESAL ) 10 MG tablet, TAKE 0.5-1 TABLETS (5-10 MG TOTAL) BY MOUTH 3 (THREE) TIMES DAILY., Disp: 270 tablet, Rfl: 1   ketoconazole  (NIZORAL ) 2 % cream, Apply 1 Application topically 2 (two) times daily., Disp: 60 g, Rfl: 0   lamoTRIgine  (LAMICTAL ) 200 MG tablet, Take 1 tablet (200 mg total) by mouth 2 (two) times daily., Disp: 60 tablet, Rfl: 1   ocrelizumab  (OCREVUS ) 300 MG/10ML injection, Inject into the vein once. Initial dose: 300mg  IV on day 1 and repeat day 15. Maintenance: 600mg  IV q 6months, Disp: , Rfl:    Vitamin D , Ergocalciferol , (DRISDOL ) 1.25 MG (50000 UNIT) CAPS capsule, Take 1 capsule (50,000 Units  total) by mouth every 7 (seven) days., Disp: 12 capsule, Rfl: 0   clonazePAM  (KLONOPIN ) 1 MG tablet, Take 1 tablet (1 mg total) by mouth 3 (three) times daily as needed for anxiety., Disp: 90 tablet, Rfl: 5   FLUoxetine  (PROZAC ) 20 MG capsule, Take 3 capsules (60 mg total) by  mouth daily., Disp: 90 capsule, Rfl: 1   risperidone  (RISPERDAL ) 4 MG tablet, Take 1 tablet (4 mg total) by mouth daily., Disp: 90 tablet, Rfl: 1 Medication Side Effects: none  Family Medical/ Social History: Changes?   Her husband has cirrhosis and may not live 5 years.   MENTAL HEALTH EXAM:  There were no vitals taken for this visit.There is no height or weight on file to calculate BMI.  General Appearance:  Unable to assess  Eye Contact:   Unable to assess  Speech:  Clear and Coherent and Normal Rate  Volume:  Normal  Mood:  Depressed  Affect:   Unable to assess  Thought Process:  Goal Directed and Descriptions of Associations: Circumstantial  Orientation:  Full (Time, Place, and Person)  Thought Content: Logical   Suicidal Thoughts:  No  Homicidal Thoughts:  No  Memory:  WNL  Judgement:  Good  Insight:  Good  Psychomotor Activity:   Unable to assess  Concentration:  Concentration: Good and Attention Span: Good  Recall:  Good  Fund of Knowledge: Good  Language: Good  Assets:  Communication Skills Desire for Improvement Financial Resources/Insurance Housing Transportation  ADL's:  Intact  Cognition: WNL  Prognosis:  Good   DIAGNOSES:    ICD-10-CM   1. Situational mixed anxiety and depressive disorder  F43.23     2. Bipolar I disorder (HCC)  F31.9     3. Chronic illness  R69     4. Stress due to illness of family member  Z63.79      Receiving Psychotherapy: No   RECOMMENDATIONS:  PDMP was reviewed.  Last Klonopin  filled 06/15/2023. I provided 30 minutes of non-face-to-face time during this encounter, including time spent before and after the visit in records review, medical decision making, counseling pertinent to today's visit, and charting.   We discussed the situational anxiety and depression.  I think increasing the Lamictal  will be helpful however counseling needs to be a priority.  She understands.  Continue Klonopin  1 mg, 1 p.o. 3 times daily as  needed. Continue Prozac  60 mg daily. Increase Lamictal  to 200 mg, 1 p.o. twice daily. Continue Risperdal  4 mg nightly. Recommend counseling. Return in 6-8 weeks.  Marvia Slocumb, PA-C

## 2023-08-17 ENCOUNTER — Ambulatory Visit
Admission: RE | Admit: 2023-08-17 | Discharge: 2023-08-17 | Disposition: A | Discharge status: Home / Self Care | Source: Ambulatory Visit | Attending: General Practice | Admitting: General Practice

## 2023-08-17 DIAGNOSIS — Z1231 Encounter for screening mammogram for malignant neoplasm of breast: Secondary | ICD-10-CM

## 2023-08-21 ENCOUNTER — Encounter: Payer: Self-pay | Admitting: Family Medicine

## 2023-08-21 ENCOUNTER — Encounter: Payer: Self-pay | Admitting: Hematology and Oncology

## 2023-08-21 NOTE — Telephone Encounter (Signed)
 Called and LVM for pt at [phone]. Asked her to call office.  Called [phone]. LVM for her to call office.

## 2023-09-16 ENCOUNTER — Other Ambulatory Visit: Payer: Self-pay | Admitting: General Practice

## 2023-09-16 DIAGNOSIS — L304 Erythema intertrigo: Secondary | ICD-10-CM

## 2023-09-24 ENCOUNTER — Encounter: Admitting: Obstetrics and Gynecology

## 2023-10-08 ENCOUNTER — Ambulatory Visit: Payer: BC Managed Care – PPO | Admitting: Family Medicine

## 2023-10-09 ENCOUNTER — Ambulatory Visit (INDEPENDENT_AMBULATORY_CARE_PROVIDER_SITE_OTHER): Admitting: Physician Assistant

## 2023-10-09 ENCOUNTER — Encounter: Payer: Self-pay | Admitting: Physician Assistant

## 2023-10-09 DIAGNOSIS — F319 Bipolar disorder, unspecified: Secondary | ICD-10-CM

## 2023-10-09 DIAGNOSIS — F411 Generalized anxiety disorder: Secondary | ICD-10-CM

## 2023-10-09 DIAGNOSIS — Z6379 Other stressful life events affecting family and household: Secondary | ICD-10-CM

## 2023-10-09 NOTE — Progress Notes (Signed)
 [facility]  Patient ID: [name],  MRN: [id number]  PCP: Vincente Shivers, NP  Date of Evaluation: 10/09/2023 Time spent:25 minutes  Chief Complaint:  Chief Complaint   Depression; Anxiety; Follow-up    Virtual Visit via Telehealth  I connected with patient by telephone, with their informed consent, and verified patient privacy and that I am speaking with the correct person using two identifiers.  I am private, in my office and the patient is at home.  I discussed the limitations, risks, security and privacy concerns of performing an evaluation and management service by telephone and the availability of in person appointments. I also discussed with the patient that there may be a patient responsible charge related to this service. The patient expressed understanding and agreed to proceed.   I discussed the assessment and treatment plan with the patient. The patient was provided an opportunity to ask questions and all were answered. The patient agreed with the plan and demonstrated an understanding of the instructions.   The patient was advised to call back or seek an in-person evaluation if the symptoms worsen or if the condition fails to improve as anticipated.  I provided 25 minutes of non-face-to-face time during this encounter.  HISTORY/CURRENT STATUS: For routine follow-up.  Increased Lamictal  at LOV.  States it has been helpful.  She is still under a lot of stress, most recently because her husband is in hosp, 9 days now, brain damage 'from drinking.' Going to rehab after he is discharged from the hospital.  She has a lot of anxiety surrounding this, both financially and emotionally.  She is now working a temp job.  She really needs something permanent though but this is better than nothing right now.  ADLs and personal hygiene are normal.  Appetite is normal and weight is stable.  Does not cry easily.  No feelings of hopelessness.  Sleeps fairly well.  No manic  symptoms, psychosis, or delirium.  No SI/HI.  Denies dizziness, syncope, seizures, numbness, tingling, tremor, tics, unsteady gait, slurred speech, confusion. Denies muscle or joint pain, stiffness, or dystonia.  Individual Medical History/ Review of Systems: Changes? :No      Past medications for mental health diagnoses include: Lithium, Wellbutrin was not effective for depression or decreased desire for nicotine , Risperdal , Lamictal , Ativan , Chantix caused paranoia and increased anger, BuSpar  made her dizzy  Allergies: Rituximab   Current Medications:  Current Outpatient Medications:    aspirin EC 81 MG tablet, Take 81 mg by mouth daily. Swallow whole., Disp: , Rfl:    baclofen  (LIORESAL ) 10 MG tablet, TAKE 0.5-1 TABLETS (5-10 MG TOTAL) BY MOUTH 3 (THREE) TIMES DAILY., Disp: 270 tablet, Rfl: 1   clonazePAM  (KLONOPIN ) 1 MG tablet, Take 1 tablet (1 mg total) by mouth 3 (three) times daily as needed for anxiety., Disp: 90 tablet, Rfl: 5   FLUoxetine  (PROZAC ) 20 MG capsule, Take 3 capsules (60 mg total) by mouth daily., Disp: 90 capsule, Rfl: 1   ketoconazole  (NIZORAL ) 2 % cream, APPLY TO AFFECTED AREA TWICE A DAY, Disp: 60 g, Rfl: 0   lamoTRIgine  (LAMICTAL ) 200 MG tablet, Take 1 tablet (200 mg total) by mouth 2 (two) times daily., Disp: 60 tablet, Rfl: 1   ocrelizumab  (OCREVUS ) 300 MG/10ML injection, Inject into the vein once. Initial dose: 300mg  IV on day 1 and repeat day 15. Maintenance: 600mg  IV q 6months, Disp: , Rfl:    risperidone  (RISPERDAL ) 4 MG tablet, Take 1 tablet (4 mg total) by mouth daily., Disp:  90 tablet, Rfl: 1   Vitamin D , Ergocalciferol , (DRISDOL ) 1.25 MG (50000 UNIT) CAPS capsule, Take 1 capsule (50,000 Units total) by mouth every 7 (seven) days., Disp: 12 capsule, Rfl: 0 Medication Side Effects: none  Family Medical/ Social History: Changes?  See HPI.  MENTAL HEALTH EXAM:  There were no vitals taken for this visit.There is no height or weight on file to calculate BMI.   General Appearance: Unable to assess  Eye Contact:  Unable to assess  Speech:  Clear and Coherent and Normal Rate  Volume:  Normal  Mood:  Euthymic  Affect:  Unable to assess  Thought Process:  Goal Directed and Descriptions of Associations: Circumstantial  Orientation:  Full (Time, Place, and Person)  Thought Content: Logical   Suicidal Thoughts:  No  Homicidal Thoughts:  No  Memory:  WNL  Judgement:  Good  Insight:  Good  Psychomotor Activity:  Unable to assess  Concentration:  Concentration: Good and Attention Span: Good  Recall:  Good  Fund of Knowledge: Good  Language: Good  Assets:  Communication Skills Desire for Improvement Financial Resources/Insurance Housing Transportation  ADL's:  Intact  Cognition: WNL  Prognosis:  Good   DIAGNOSES:    ICD-10-CM   1. Bipolar I disorder (HCC)  F31.9     2. Generalized anxiety disorder  F41.1     3. Stress due to illness of family member  Z63.79      Receiving Psychotherapy: No   RECOMMENDATIONS:  PDMP was reviewed.  Last Klonopin  filled 09/17/2023. I provided approximately 25 minutes of non-face-to-face time during this encounter, including time spent before and after the visit in records review, medical decision making, counseling pertinent to today's visit, and charting.   I am glad she is feeling better since we increased Lamictal .  No medication changes are needed. I hope her husband gets along okay and completely quits drinking.  Continue Klonopin  1 mg, 1 p.o. 3 times daily as needed. Continue Prozac  60 mg daily. Continue Lamictal   200 mg, 1 p.o. twice daily. Continue Risperdal  4 mg nightly. Recommend counseling. Return in 3 months.  Verneita Cooks, PA-C

## 2023-10-16 ENCOUNTER — Other Ambulatory Visit: Payer: Self-pay | Admitting: Physician Assistant

## 2023-10-26 ENCOUNTER — Other Ambulatory Visit: Payer: Self-pay | Admitting: Neurology

## 2023-10-29 NOTE — Telephone Encounter (Signed)
 Last seen on 03/21/23 Follow up scheduled on 11/26/23

## 2023-10-30 ENCOUNTER — Ambulatory Visit: Payer: BC Managed Care – PPO | Admitting: Hematology and Oncology

## 2023-10-30 ENCOUNTER — Other Ambulatory Visit: Payer: BC Managed Care – PPO

## 2023-11-05 ENCOUNTER — Telehealth: Payer: Self-pay | Admitting: Physician Assistant

## 2023-11-05 NOTE — Telephone Encounter (Signed)
 Pt having situational anxiety and depression secondary to her husband's health. She reports he is in a LTC facility and will not be able to return home. He still recognizes her but his mind is gone, he talks crazy stuff. She gets to sleep, can't stay asleep, but getting about 6 hours of sleep. Anxiety and depression both rated as 9/10. She is working. Reports chores not getting done because she is spending time with husband after work. Clonazepam  helps but not enough currently.   [facility] [address] [facility]

## 2023-11-05 NOTE — Telephone Encounter (Signed)
 LVM to [facility]

## 2023-11-05 NOTE — Telephone Encounter (Signed)
 [name] is having a rough time as her husband is dying. She would like to speak to a nurse. Phone number is [phone].

## 2023-11-06 ENCOUNTER — Other Ambulatory Visit: Payer: Self-pay

## 2023-11-06 MED ORDER — ALPRAZOLAM 1 MG PO TABS: ORAL_TABLET | ORAL | 1 refills | Status: DC

## 2023-11-06 NOTE — Telephone Encounter (Signed)
 Notified patient of recommendations. Pended Rx for alprazolam  with notation to discontinue clonazepam . Discontinued clonazepam  in Epic.

## 2023-11-06 NOTE — Telephone Encounter (Signed)
 I'm so sorry about her husband. Increase Prozac  to 80 mg daily. Let's change Klonopin  to Xanax  1 mg, 1 po tid prn and add an extra 1/2 during the day prn. #105 RF 1

## 2023-11-08 NOTE — Telephone Encounter (Signed)
 Pt called at 4;40p.  She said the pharmacy told her she has to have an addendum to get the Xanax  or wait until 8/9.  She still has some Clonazepam  left.  I told her they may want her to bring that in to the office first.  Pls call her back to advise.  Confirmed pharmacy is [facility] [address].

## 2023-11-09 NOTE — Telephone Encounter (Signed)
 Called the pharmacy and was told they will fill it today. Notified patient.

## 2023-11-13 ENCOUNTER — Encounter: Admitting: Obstetrics & Gynecology

## 2023-11-26 ENCOUNTER — Ambulatory Visit: Payer: Self-pay | Admitting: Neurology

## 2023-11-26 ENCOUNTER — Encounter: Payer: Self-pay | Admitting: Neurology

## 2023-11-26 VITALS — BP 116/72 | HR 78 | Ht 65.5 in | Wt 203.0 lb

## 2023-11-26 DIAGNOSIS — R3915 Urgency of urination: Secondary | ICD-10-CM

## 2023-11-26 DIAGNOSIS — F319 Bipolar disorder, unspecified: Secondary | ICD-10-CM

## 2023-11-26 DIAGNOSIS — Z79899 Other long term (current) drug therapy: Secondary | ICD-10-CM

## 2023-11-26 DIAGNOSIS — R269 Unspecified abnormalities of gait and mobility: Secondary | ICD-10-CM | POA: Diagnosis not present

## 2023-11-26 DIAGNOSIS — G35 Multiple sclerosis: Secondary | ICD-10-CM

## 2023-11-26 DIAGNOSIS — G35C1 Active secondary progressive multiple sclerosis: Secondary | ICD-10-CM

## 2023-11-26 NOTE — Progress Notes (Addendum)
 "  [facility]  PATIENT: [name] DOB: [birth year]  REFERRING DOCTOR OR PCP: Aleck Jacquet MD; Vernell Fort, MD (PCP) SOURCE: Patient, notes from recent hospital stay, imaging and lab reports, MRI images personally reviewed.  _________________________________   HISTORICAL  CHIEF COMPLAINT:  Chief Complaint  Patient presents with   Follow-up    Pt in room 10. Alone. Here for MS follow up. DMT: Ocrevus  Last infusion date: 07/05/2023 Next infusion date: 01/03/2024. Pt reports she has been stressed, pt said she can't walk hardly, pt reports falls at home. Pt states doctors told her that her husband has 6 months -1 year to live.      HISTORY OF PRESENT ILLNESS:  I had the pleasure of seeing the patient, [name], at [facility] for neurologic consultation about her recent weakness and gait disturbance and abnormal MRIs consistent with MS.   Update 11/26/2023: She started Ocrevus  Jan/Feb 2024.   She had been on Tysabri  but switched to reduce days off work and because she started Rituxan  for cold agglutinin disease.    She is tolerating it well and has no exacerbation or new neurologic symptom since starting  She notes gait is worse and having more trouble with strength, balance and endurance.   She will sometimes use a cane.   She can walk only 300-500 feet without a break now and was able to go 1/2 mile last year.  Going up and down stairs is always difficult.  She has weakness and spasticity when she walks, helped by taking baclofen  20 mg at bedtime (daytime made sleepy)   Left leg bothers her more than right though both feel  little weak.   .   She has mild left leg numbness.         Bladder function is better.   She has some urgency, especially in the mornings.  She stopped the oxybutynin  as she improved.     Vision is doing well if she wears glasses.  She has h/o left sided ON - acuity ok though colors still desaturated.  Glare seems  worse.     Fatigue is bothering her more   She is sleeping well at night.  She has bipolar disease and currently some depression.   She is on lamotrigine , risperidone , fluoxetine  and alprazolam  with benefit.      She notes some trouble with memory and focus.  She has more stress with her husbands advanced cirrhosis.  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 Lonn)  She snores.   She has some EDS.    EPWORTH SLEEPINESS SCALE  On a scale of 0 - 3 what is the chance of dozing:  Sitting and Reading:   1 Watching TV:    1 Sitting inactive in a public place: 1 Passenger in car for one hour: 0 Lying down to rest in the afternoon: 3 Sitting and talking to someone: 0 Sitting quietly after lunch:  1 In a car, stopped in traffic:  0  Total (out of 24): 7/24.      (Normal, <10)    MS HISTORY: She had left sided numbness, poor balance and poor gait starting around Christmas 2022.   This was worsening over the next couple days.   She presented to urgent care 04/19/2021 and  aa stroke was suspected.   She was then sent to the ED.   In the ED, she had MRI's.  She was admitted to [facility] and had 5 days of IV Solumedrol.   PT was initiated.  She started ti improve byt the end of her hospital stay and has continued to improve.   She started outpatient PT but due to losing her job, she stopped but is still doing the exercises.   In retrospect, in 2015, she had diplopia and blurry right vision.   An MRI at the time was read as normal (my read has one small T2/Flair focus).   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 None of the foci enhanced or appear to be acute. However, the focus in the left midbrain and 2 other foci seen on today's MRI were not present on the MRI from 04/20/2021   MRI of the brain 04/19/2021 and 04/20/2021 showed multiple T2/FLAIR foci in the periventricular and deep white matter of the hemispheres consistent with demyelinating plaque.  After the infusion of contrast (the next day) 2 periventricular foci and 1 deep white matter focus enhanced consistent with acute demyelination.  MRI of the thoracic spine with and without contrast 04/20/2021 showed multiple T2 hyperintense foci within the spinal cord.  These are located at C7, towards the left at T1, centrally towards the right at T5-T6, posterolaterally to the right at T6, centrally to the left adjacent to T7, to the right adjacent to T8-T9 anterolaterally to the right adjacent to T11.  The foci adjacent to C2, C5, C6, T1, T8 and T9 and T11 enhanced after contrast.  Additionally, there are disc protrusions at C6-C7, to the left at T7-T8, broadly at T10-T11 that causes mild spinal stenosis.  Disc bulge is noted to the left at T6-T7  MRI of the brain 12/27/2013 (at the time of right visual loss) was read as normal.  There appears to be 1 small nonenhancing focus in the left posterior frontal lobe.   Laboratory: January 2023: Vitamin D  was low at 10.  Hemoglobin A1c was normal.  HIV negative.  RPR negative.  QuantiFERON-TB was indeterminant, hepatitis panel was negative, hep C RNA was negative.SABRA  REVIEW OF SYSTEMS: Constitutional: No fevers, chills, sweats, or change in appetite Eyes: No visual changes, double vision, eye pain Ear, nose and throat: No hearing loss, ear pain, nasal congestion, sore throat Cardiovascular: No chest pain, palpitations Respiratory:  No shortness  of breath at rest or with exertion.   No wheezes GastrointestinaI: No nausea, vomiting, diarrhea, abdominal pain, fecal incontinence Genitourinary:  No dysuria, urinary retention or frequency.  No nocturia. Musculoskeletal:  No neck pain, back pain Integumentary: No rash, pruritus, skin lesions Neurological: as above Psychiatric: No depression at this time.  No anxiety Endocrine: No palpitations, diaphoresis, change in appetite, change in weigh or increased thirst Hematologic/Lymphatic:  No anemia, purpura, petechiae. Allergic/Immunologic: No itchy/runny eyes, nasal congestion, recent allergic reactions, rashes  ALLERGIES: Allergies  Allergen Reactions   Rituximab  Other (See Comments)    Patient had hypersensitivity reaction during first administration. Please see progress notes dated 10/24/2021.  HOME MEDICATIONS:  Current Outpatient Medications:    aspirin EC 81 MG tablet, Take 81 mg by mouth daily. Swallow whole., Disp: , Rfl:    ketoconazole  (NIZORAL ) 2 % cream, APPLY TO AFFECTED AREA TWICE A DAY, Disp: 60 g, Rfl: 0   ocrelizumab  (OCREVUS ) 300 MG/10ML injection, Inject into the vein once. Initial dose: 300mg  IV on day 1 and repeat day 15. Maintenance: 600mg  IV q 6months, Disp: , Rfl:    ALPRAZolam  (XANAX ) 1 MG tablet, Take 1 tablet (1 mg total) by mouth at bedtime as needed for anxiety. With occasional extra 1/2-1 during afternoon prn.  Must last 30 days., Disp: 45 tablet, Rfl: 5   amphetamine -dextroamphetamine  (ADDERALL) 10 MG tablet, Take 1 tablet (10 mg total) by mouth daily with breakfast., Disp: 30 tablet, Rfl: 0   amphetamine -dextroamphetamine  (ADDERALL) 10 MG tablet, Take 1 tablet (10 mg total) by mouth daily with breakfast., Disp: 30 tablet, Rfl: 0   [START ON 06/16/2024] amphetamine -dextroamphetamine  (ADDERALL) 10 MG tablet, Take 1 tablet (10 mg total) by mouth daily with breakfast., Disp: 30 tablet, Rfl: 0   baclofen  (LIORESAL ) 10 MG tablet, TAKE 0.5-1 TABLETS (5-10 MG TOTAL)  BY MOUTH 3 (THREE) TIMES DAILY., Disp: 270 tablet, Rfl: 3   FLUoxetine  (PROZAC ) 20 MG capsule, Take 3 capsules (60 mg total) by mouth daily., Disp: 90 capsule, Rfl: 11   lamoTRIgine  (LAMICTAL ) 200 MG tablet, Take 1 tablet (200 mg total) by mouth 2 (two) times daily., Disp: 60 tablet, Rfl: 11   risperidone  (RISPERDAL ) 4 MG tablet, TAKE 1 TABLET BY MOUTH EVERY DAY, Disp: 90 tablet, Rfl: 1   Vitamin D , Ergocalciferol , (DRISDOL ) 1.25 MG (50000 UNIT) CAPS capsule, Take 1 capsule (50,000 Units total) by mouth every 7 (seven) days., Disp: 12 capsule, Rfl: 0  PAST MEDICAL HISTORY: Past Medical History:  Diagnosis Date   Abnormal ultrasound of parathyroid gland    Anxiety    Bipolar 1 disorder (HCC)    Clotting disorder    Deficiency anemia 11/14/2021   Depression    Folate deficiency 10/07/2021   Hepatitis C    High risk medication use 05/23/2021   Hypercalcemia 04/27/2022   Numbness 05/23/2021   Thyroid  nodule greater than or equal to 1 cm in diameter incidentally noted on imaging study    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        Vitamin B12 deficiency 10/07/2021   Vitamin D  deficiency 10/07/2021    PAST SURGICAL HISTORY: Past Surgical History:  Procedure Laterality Date   CESAREAN SECTION  x 2    FAMILY HISTORY: Family History  Problem Relation Age of Onset   High blood pressure Mother    Stroke Maternal Grandfather    Diabetes Neg Hx    Breast cancer Neg Hx     SOCIAL HISTORY:  Social History   Socioeconomic History   Marital status: Married    Spouse name: Oneil   Number of children: 2   Years of education: Not on file   Highest education level: Bachelor's degree (e.g., BA, AB, BS)  Occupational History   Occupation: accounts payable  Tobacco Use   Smoking status: Every Day    Types: E-cigarettes   Smokeless tobacco: Never   Tobacco comments:    vapes q 2 hours or so. Used to smoke 1 ppd 8 years ago.    Vaping Use   Vaping status: Every Day   Substances: Nicotine   Substance and  Sexual Activity   Alcohol use: Never   Drug use: Not Currently    Comment: In her 40s.   Sexual activity: Yes    Birth control/protection: Surgical  Other Topics Concern   Not on file  Social History Narrative   Live at home with husband and 2 children   Right handed   Caffeine: 1 C of coffee a day, occ a soda a day   Social Drivers of Health   Tobacco Use: High Risk (04/14/2024)   Patient History    Smoking Tobacco Use: Every Day    Smokeless Tobacco Use: Never    Passive Exposure: Not on file  Financial Resource Strain: Medium Risk (07/17/2023)   Overall Financial Resource Strain (CARDIA)    Difficulty of Paying Living Expenses: Somewhat hard  Food Insecurity: Food Insecurity Present (07/17/2023)   Hunger Vital Sign    Worried About Running Out of Food in the Last Year: Sometimes true    Ran Out of Food in the Last Year: Sometimes true  Transportation Needs: No Transportation Needs (07/17/2023)   PRAPARE - Administrator, Civil Service (Medical): No    Lack of Transportation (Non-Medical): No  Physical Activity: Insufficiently Active (07/17/2023)   Exercise Vital Sign    Days of Exercise per Week: 2 days    Minutes of Exercise per Session: 30 min  Stress: No Stress Concern Present (07/17/2023)   [employer] of Occupational Health - Occupational Stress Questionnaire    Feeling of Stress : Only a little  Social Connections: Moderately Isolated (07/17/2023)   Social Connection and Isolation Panel    Frequency of Communication with Friends and Family: More than three times a week    Frequency of Social Gatherings with Friends and Family: Not on file    Attends Religious Services: Never    Active Member of Clubs or Organizations: No    Attends Engineer, Structural: Not on file    Marital Status: Married  Catering Manager Violence: Not on file  Depression (PHQ2-9): Low Risk  (02/06/2024)   Depression (PHQ2-9)    PHQ-2 Score: 0  Alcohol Screen: Not on file  Housing: Unknown (07/17/2023)   Housing Stability Vital Sign    Unable to Pay for Housing in the Last Year: No    Number of Times Moved in the Last Year: Not on file    Homeless in the Last Year: No  Utilities: Not on file  Health Literacy: Not on file     PHYSICAL EXAM  Vitals:   11/26/23 1241  BP: 116/72  Pulse: 78  Weight: 203 lb (92.1 kg)  Height: 5' 5.5 (1.664 m)    Body mass index is 33.27 kg/m.   General: The patient is well-developed and well-nourished and in no acute distress  HEENT:  Head  is [place]/AT  Skin: Extremities are without rash or  edema.  Musculoskeletal:  Back is nontender  Neurologic Exam  Mental status: The patient is alert and oriented x 3 at the time of the examination. The patient has apparent normal recent and remote memory, with an apparently normal attention span and concentration ability.   Speech is normal.  Cranial nerves: Extraocular movements are full. Pupils are equal, round, and reactive to light and accomodation.  She has reduced color saturation and acuity OS. .   There is good facial sensation to soft touch bilaterally.Facial strength is normal.  No obvious hearing deficits are noted.  Motor:  Muscle bulk is normal.  Tone is increased in left >right leg. . Strength is  5 / 5 in aamrs and right leg but 4+/5 in left iliopsoas and foot/ankle.   Sensory: Sensory testing now normal in arms and legs    Coordination: Cerebellar testing reveals good finger-nose-finger and heel-to-shin bilaterally.  Gait and station: Station is normal.   Gait is mildly ataxic and has mild left foot drop.  Turn is unstable.   The tandem gait is wide.. Romberg is negative.   Reflexes: Deep tendon reflexes are symmetric and normal bilaterally.     25 foot timed walk 12.9 sec    DIAGNOSTIC DATA (LABS, IMAGING, TESTING) - I reviewed patient records, labs, notes, testing and  imaging myself where available.  Lab Results  Component Value Date   WBC 6.7 11/26/2023   HGB 14.3 11/26/2023   HCT 41.0 11/26/2023   MCV 93 11/26/2023   PLT 192 11/26/2023      Component Value Date/Time   NA 141 02/06/2024 0908   NA 140 11/26/2023 1337   K 4.4 02/06/2024 0908   CL 105 02/06/2024 0908   CO2 29 02/06/2024 0908   GLUCOSE 89 02/06/2024 0908   BUN 10 02/06/2024 0908   BUN 14 11/26/2023 1337   CREATININE 0.97 02/06/2024 0908   CREATININE 0.85 04/19/2022 0838   CALCIUM 10.7 (H) 02/06/2024 0908   CALCIUM 10.4 (H) 04/22/2021 0119   PROT 7.2 02/06/2024 0908   PROT 6.8 11/26/2023 1337   ALBUMIN 4.5 02/06/2024 0908   ALBUMIN 4.4 11/26/2023 1337   AST 13 02/06/2024 0908   AST 15 04/19/2022 0838   ALT 15 02/06/2024 0908   ALT 13 04/19/2022 0838   ALKPHOS 115 02/06/2024 0908   BILITOT 0.4 02/06/2024 0908   BILITOT 0.4 11/26/2023 1337   BILITOT 0.7 04/19/2022 0838   GFRNONAA >60 10/26/2022 0758   GFRNONAA >60 04/19/2022 0838   GFRAA >90 12/27/2013 1827   Lab Results  Component Value Date   CHOL 165 07/17/2023   HDL 54.80 07/17/2023   LDLCALC 100 (H) 07/17/2023   TRIG 50.0 07/17/2023   CHOLHDL 3 07/17/2023   Lab Results  Component Value Date   HGBA1C 5.1 07/17/2023   Lab Results  Component Value Date   VITAMINB12 236 07/17/2023   Lab Results  Component Value Date   TSH 2.09 07/17/2023       ASSESSMENT AND PLAN  Multiple sclerosis - Plan: MR BRAIN W WO CONTRAST, MR CERVICAL SPINE W WO CONTRAST, IgG, IgA, IgM, Comprehensive metabolic panel with GFR, CBC with Differential/Platelet  Active secondary progressive multiple sclerosis  Bipolar 1 disorder (HCC)  Gait disturbance - Plan: MR BRAIN W WO CONTRAST, MR CERVICAL SPINE W WO CONTRAST  Urinary urgency  High risk medication use - Plan: IgG, IgA, IgM, Comprehensive metabolic panel with GFR, CBC with Differential/Platelet   1   Continue Ocrevus .   Check gG/IgM and CBC/D.  SABRA  We will check labs  (IgG/IgM) and an MRI of the brain in 2025 to determine if there is any breakthrough activity.  If present, consider a different disease modifying therapy. 2.  Stay active and exercise as tolerated.  If she feels off balance she should use her cane.  3.   She has reduced focus/attention.  She might benefit form a stimulant but as she hs bipolar disease would need psychiatry to approve. 4.   Consider Ampyra for walking - need to check crt/GFR first.   5.   return in 6 months  or sooner if there are new or worsening neurologic symptoms.  Candela Krul A. Vear, MD, [facility] 05/20/2024, 5:46 PM Certified in Neurology, Clinical Neurophysiology, Sleep Medicine and Neuroimaging  [facility] [address], [address] [place], [place] [address] [phone] "

## 2023-11-27 ENCOUNTER — Telehealth: Payer: Self-pay | Admitting: Physician Assistant

## 2023-11-27 ENCOUNTER — Ambulatory Visit: Payer: Self-pay | Admitting: Neurology

## 2023-11-27 LAB — COMPREHENSIVE METABOLIC PANEL WITH GFR
ALT: 13 IU/L (ref 0–32)
AST: 18 IU/L (ref 0–40)
Albumin: 4.4 g/dL (ref 3.9–4.9)
Alkaline Phosphatase: 148 IU/L — ABNORMAL HIGH (ref 44–121)
BUN/Creatinine Ratio: 15 (ref 9–23)
BUN: 14 mg/dL (ref 6–24)
Bilirubin Total: 0.4 mg/dL (ref 0.0–1.2)
CO2: 24 mmol/L (ref 20–29)
Calcium: 10.9 mg/dL — ABNORMAL HIGH (ref 8.7–10.2)
Chloride: 104 mmol/L (ref 96–106)
Creatinine, Ser: 0.96 mg/dL (ref 0.57–1.00)
Globulin, Total: 2.4 g/dL (ref 1.5–4.5)
Glucose: 88 mg/dL (ref 70–99)
Potassium: 4.1 mmol/L (ref 3.5–5.2)
Sodium: 140 mmol/L (ref 134–144)
Total Protein: 6.8 g/dL (ref 6.0–8.5)
eGFR: 73 mL/min/1.73 (ref >59)

## 2023-11-27 LAB — IGG, IGA, IGM
IgA/Immunoglobulin A, Serum: 238 mg/dL (ref 87–352)
IgG (Immunoglobin G), Serum: 1180 mg/dL (ref 586–1602)
IgM (Immunoglobulin M), Srm: 88 mg/dL (ref 26–217)

## 2023-11-27 LAB — CBC WITH DIFFERENTIAL/PLATELET
Basophils Absolute: 0.1 x10E3/uL (ref 0.0–0.2)
Basos: 1 %
EOS (ABSOLUTE): 0.2 x10E3/uL (ref 0.0–0.4)
Eos: 2 %
Hematocrit: 41 % (ref 34.0–46.6)
Hemoglobin: 14.3 g/dL (ref 11.1–15.9)
Immature Grans (Abs): 0 x10E3/uL (ref 0.0–0.1)
Immature Granulocytes: 0 %
Lymphocytes Absolute: 1 x10E3/uL (ref 0.7–3.1)
Lymphs: 15 %
MCH: 32.6 pg (ref 26.6–33.0)
MCHC: 34.9 g/dL (ref 31.5–35.7)
MCV: 93 fL (ref 79–97)
Monocytes Absolute: 0.6 x10E3/uL (ref 0.1–0.9)
Monocytes: 9 %
Neutrophils Absolute: 4.9 x10E3/uL (ref 1.4–7.0)
Neutrophils: 73 %
Platelets: 192 x10E3/uL (ref 150–450)
RBC: 4.39 x10E6/uL (ref 3.77–5.28)
RDW: 11.8 % (ref 11.7–15.4)
WBC: 6.7 x10E3/uL (ref 3.4–10.8)

## 2023-11-27 NOTE — Telephone Encounter (Signed)
 Pt called and said that she is having problems concentrating. She would like to know is there a medication that teresa recommends to help her with that. Please call her at [phone]

## 2023-11-28 ENCOUNTER — Ambulatory Visit

## 2023-11-28 DIAGNOSIS — G35 Multiple sclerosis: Secondary | ICD-10-CM | POA: Diagnosis not present

## 2023-11-28 DIAGNOSIS — R269 Unspecified abnormalities of gait and mobility: Secondary | ICD-10-CM

## 2023-11-28 MED ORDER — GADOBENATE DIMEGLUMINE 529 MG/ML IV SOLN
20.0000 mL | Freq: Once | INTRAVENOUS | Status: AC | PRN
  Administered 2023-11-28 (×2): 20 mL via INTRAVENOUS

## 2023-11-28 NOTE — Telephone Encounter (Signed)
 Pt reporting she is having trouble with memory and focus. 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 Dr. Vear prescribed Armodafinil  in early 2024. It looks like she took it for 2 months and it was reported as being ineffective. She doesn't remember anything about it. At 09/28/22 visit with Dr. Vear she voiced the same concerns of memory/focus.   She is aware you are out of the office until next week and that issue will be addressed on your return.

## 2023-11-29 ENCOUNTER — Other Ambulatory Visit: Payer: Self-pay

## 2023-11-29 DIAGNOSIS — F900 Attention-deficit hyperactivity disorder, predominantly inattentive type: Secondary | ICD-10-CM

## 2023-11-29 MED ORDER — AMPHETAMINE-DEXTROAMPHETAMINE 5 MG PO TABS: 5.0000 mg | ORAL_TABLET | ORAL | 0 refills | Status: DC

## 2023-11-29 NOTE — Telephone Encounter (Signed)
 Pended Rx for Adderall 5 mg. LVM to RC to provide recommendations.

## 2023-11-29 NOTE — Telephone Encounter (Signed)
 Pt returned call and she was told about Rx and the commendations for taking meds.

## 2023-11-29 NOTE — Telephone Encounter (Signed)
 I reviewed Dr. Duncan recent note. He suggested a stimulant if appropriate with Bip D/O.  Lets start Adderall 5 mg, #30, 1 po q am.  Please pend for me.  I agree with his recommendation to decrease freq of Xanax  when possible b/c they can be causing some of the memory issues and decreased focus. Also don't take the Xanax  within 3 hours or so of taking the Adderall.  If she starts having manic sx, ie  increased energy with decreased need for sleep, increased talkativeness, racing thoughts, impulsivity or risky behaviors, increased spending, increased irritability or anger, call us .

## 2023-12-07 ENCOUNTER — Other Ambulatory Visit: Payer: Self-pay | Admitting: General Practice

## 2023-12-07 DIAGNOSIS — E559 Vitamin D deficiency, unspecified: Secondary | ICD-10-CM

## 2023-12-10 NOTE — Telephone Encounter (Signed)
 Patient due for follow up on Oct 2025; please call patient to set up follow up and also can schedule lab appt before then for vit d recheck or can do at visit. We can not refill vit d until labs are rechecked.

## 2023-12-11 ENCOUNTER — Ambulatory Visit: Payer: Self-pay | Admitting: General Practice

## 2023-12-11 ENCOUNTER — Other Ambulatory Visit (INDEPENDENT_AMBULATORY_CARE_PROVIDER_SITE_OTHER)

## 2023-12-11 DIAGNOSIS — E559 Vitamin D deficiency, unspecified: Secondary | ICD-10-CM | POA: Diagnosis not present

## 2023-12-11 LAB — VITAMIN D 25 HYDROXY (VIT D DEFICIENCY, FRACTURES): VITD: 30.12 ng/mL (ref 30.00–100.00)

## 2023-12-31 ENCOUNTER — Other Ambulatory Visit: Payer: Self-pay

## 2023-12-31 ENCOUNTER — Telehealth: Payer: Self-pay | Admitting: Physician Assistant

## 2023-12-31 DIAGNOSIS — F900 Attention-deficit hyperactivity disorder, predominantly inattentive type: Secondary | ICD-10-CM

## 2023-12-31 MED ORDER — AMPHETAMINE-DEXTROAMPHETAMINE 5 MG PO TABS: 5.0000 mg | ORAL_TABLET | ORAL | 0 refills | Status: DC

## 2023-12-31 NOTE — Telephone Encounter (Signed)
 Pt called requesting a refill on her adderall 5 mg. Pharmacy is [facility] on [address]. Next appt 9/26

## 2023-12-31 NOTE — Telephone Encounter (Signed)
 Pended

## 2024-01-03 DIAGNOSIS — G35 Multiple sclerosis: Secondary | ICD-10-CM | POA: Diagnosis not present

## 2024-01-08 ENCOUNTER — Encounter: Payer: Self-pay | Admitting: Neurology

## 2024-01-09 ENCOUNTER — Ambulatory Visit: Admitting: Physician Assistant

## 2024-01-09 ENCOUNTER — Encounter: Payer: Self-pay | Admitting: Physician Assistant

## 2024-01-09 DIAGNOSIS — F319 Bipolar disorder, unspecified: Secondary | ICD-10-CM | POA: Diagnosis not present

## 2024-01-09 DIAGNOSIS — G35 Multiple sclerosis: Secondary | ICD-10-CM

## 2024-01-09 DIAGNOSIS — F411 Generalized anxiety disorder: Secondary | ICD-10-CM | POA: Diagnosis not present

## 2024-01-09 DIAGNOSIS — F900 Attention-deficit hyperactivity disorder, predominantly inattentive type: Secondary | ICD-10-CM

## 2024-01-09 MED ORDER — ALPRAZOLAM 1 MG PO TABS: 1.0000 mg | ORAL_TABLET | Freq: Every evening | ORAL | 5 refills | Status: AC | PRN

## 2024-01-09 MED ORDER — FLUOXETINE HCL 20 MG PO CAPS: 60.0000 mg | ORAL_CAPSULE | Freq: Every day | ORAL | 11 refills | Status: AC

## 2024-01-09 MED ORDER — AMPHETAMINE-DEXTROAMPHETAMINE 5 MG PO TABS: 5.0000 mg | ORAL_TABLET | Freq: Every day | ORAL | 0 refills | Status: DC

## 2024-01-09 MED ORDER — AMPHETAMINE-DEXTROAMPHETAMINE 5 MG PO TABS: 5.0000 mg | ORAL_TABLET | ORAL | 0 refills | Status: DC

## 2024-01-09 NOTE — Progress Notes (Signed)
 [facility]  Patient ID: [name],  MRN: [id number]  PCP: Vincente Shivers, NP  Date of Evaluation: 01/09/2024 Time spent:25 minutes  Chief Complaint:  Chief Complaint   Anxiety; Depression; Follow-up    Virtual Visit via Telehealth  I connected with patient by telephone, with their informed consent, and verified patient privacy and that I am speaking with the correct person using two identifiers.  I am private, in my office and the patient is at home.  I discussed the limitations, risks, security and privacy concerns of performing an evaluation and management service by telephone and the availability of in person appointments. I also discussed with the patient that there may be a patient responsible charge related to this service. The patient expressed understanding and agreed to proceed.   I discussed the assessment and treatment plan with the patient. The patient was provided an opportunity to ask questions and all were answered. The patient agreed with the plan and demonstrated an understanding of the instructions.   The patient was advised to call back or seek an in-person evaluation if the symptoms worsen or if the condition fails to improve as anticipated.  I provided 25 minutes of non-face-to-face time during this encounter.  HISTORY/CURRENT STATUS: For routine follow-up.  Adderall is working well.  States that attention is good without easy distractibility.  Able to focus on things and finish tasks to completion.   Husband is home now which makes her feel better.  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   Energy and motivation are good.    No extreme sadness, tearfulness, or feelings of hopelessness.  Sleeps ok. ADLs and personal hygiene are normal.    Appetite has not changed.  Weight is stable.  No mania, delirium, AH/VH.  No SI/HI.  Individual Medical History/ Review of Systems: Changes? :No      Past medications for mental  health diagnoses include: Lithium, Wellbutrin was not effective for depression or decreased desire for nicotine , Risperdal , Lamictal , Ativan , Chantix caused paranoia and increased anger, BuSpar  made her dizzy  Allergies: Rituximab   Current Medications:  Current Outpatient Medications:    [START ON 02/28/2024] amphetamine -dextroamphetamine  (ADDERALL) 5 MG tablet, Take 1 tablet (5 mg total) by mouth daily., Disp: 30 tablet, Rfl: 0   [START ON 01/29/2024] amphetamine -dextroamphetamine  (ADDERALL) 5 MG tablet, Take 1 tablet (5 mg total) by mouth daily., Disp: 30 tablet, Rfl: 0   aspirin EC 81 MG tablet, Take 81 mg by mouth daily. Swallow whole., Disp: , Rfl:    baclofen  (LIORESAL ) 10 MG tablet, TAKE 0.5-1 TABLETS (5-10 MG TOTAL) BY MOUTH 3 (THREE) TIMES DAILY., Disp: 270 tablet, Rfl: 0   ketoconazole  (NIZORAL ) 2 % cream, APPLY TO AFFECTED AREA TWICE A DAY, Disp: 60 g, Rfl: 0   lamoTRIgine  (LAMICTAL ) 200 MG tablet, TAKE 1 TABLET BY MOUTH TWICE A DAY, Disp: 60 tablet, Rfl: 2   ocrelizumab  (OCREVUS ) 300 MG/10ML injection, Inject into the vein once. Initial dose: 300mg  IV on day 1 and repeat day 15. Maintenance: 600mg  IV q 6months, Disp: , Rfl:    risperidone  (RISPERDAL ) 4 MG tablet, Take 1 tablet (4 mg total) by mouth daily., Disp: 90 tablet, Rfl: 1   Vitamin D , Ergocalciferol , (DRISDOL ) 1.25 MG (50000 UNIT) CAPS capsule, Take 1 capsule (50,000 Units total) by mouth every 7 (seven) days., Disp: 12 capsule, Rfl: 0   ALPRAZolam  (XANAX ) 1 MG tablet, Take 1 tablet (1 mg total) by mouth at  bedtime as needed for anxiety. With occasional extra 1/2-1 during afternoon prn.  Must last 30 days., Disp: 45 tablet, Rfl: 5   [START ON 03/28/2024] amphetamine -dextroamphetamine  (ADDERALL) 5 MG tablet, Take 1 tablet (5 mg total) by mouth every morning., Disp: 30 tablet, Rfl: 0   FLUoxetine  (PROZAC ) 20 MG capsule, Take 3 capsules (60 mg total) by mouth daily., Disp: 90 capsule, Rfl: 11 Medication Side Effects:  none  Family Medical/ Social History: Changes?  See HPI.  MENTAL HEALTH EXAM:  There were no vitals taken for this visit.There is no height or weight on file to calculate BMI.  General Appearance: Unable to assess  Eye Contact:  Unable to assess  Speech:  Clear and Coherent and Normal Rate  Volume:  Normal  Mood:  Euthymic  Affect:  Unable to assess  Thought Process:  Goal Directed and Descriptions of Associations: Circumstantial  Orientation:  Full (Time, Place, and Person)  Thought Content: Logical   Suicidal Thoughts:  No  Homicidal Thoughts:  No  Memory:  WNL  Judgement:  Good  Insight:  Good  Psychomotor Activity:  Unable to assess  Concentration:  Concentration: Good and Attention Span: Good  Recall:  Good  Fund of Knowledge: Good  Language: Good  Assets:  Communication Skills Desire for Improvement Financial Resources/Insurance Housing Leisure Time Resilience Transportation  ADL's:  Intact  Cognition: WNL  Prognosis:  Good   DIAGNOSES:    ICD-10-CM   1. Bipolar I disorder (HCC)  F31.9     2. ADHD (attention deficit hyperactivity disorder), inattentive type  F90.0 amphetamine -dextroamphetamine  (ADDERALL) 5 MG tablet    3. Generalized anxiety disorder  F41.1     4. Multiple sclerosis  G35       Receiving Psychotherapy: No   RECOMMENDATIONS:  PDMP was reviewed.  Last Adderall filled 12/31/2023.  Xanax  filled 12/15/2023. I provided approximately 25 minutes of non-face-to-face time during this encounter, including time spent before and after the visit in records review, medical decision making, counseling pertinent to today's visit, and charting.   She's stable on current meds so no changes are needed.   Continue Xanax  1 mg, 1 p.o. 3 times daily as needed. Take as sparingly as possible. Continue Adderall 5 mg, 1 q am.  Continue Prozac  60 mg daily. Continue Lamictal   200 mg, 1 p.o. twice daily. Continue Risperdal  4 mg nightly. Recommend counseling. Return  in 2 months.  Verneita Cooks, PA-C

## 2024-01-10 ENCOUNTER — Encounter: Payer: Self-pay | Admitting: Physician Assistant

## 2024-01-22 ENCOUNTER — Other Ambulatory Visit: Payer: Self-pay | Admitting: Physician Assistant

## 2024-01-31 ENCOUNTER — Ambulatory Visit: Admitting: General Practice

## 2024-02-06 ENCOUNTER — Encounter: Payer: Self-pay | Admitting: General Practice

## 2024-02-06 ENCOUNTER — Ambulatory Visit: Admitting: General Practice

## 2024-02-06 ENCOUNTER — Ambulatory Visit: Payer: Self-pay | Admitting: General Practice

## 2024-02-06 VITALS — BP 120/82 | HR 87 | Temp 97.7°F | Ht 65.5 in | Wt 206.0 lb

## 2024-02-06 DIAGNOSIS — E559 Vitamin D deficiency, unspecified: Secondary | ICD-10-CM | POA: Diagnosis not present

## 2024-02-06 DIAGNOSIS — I2699 Other pulmonary embolism without acute cor pulmonale: Secondary | ICD-10-CM

## 2024-02-06 DIAGNOSIS — I10 Essential (primary) hypertension: Secondary | ICD-10-CM

## 2024-02-06 DIAGNOSIS — E66811 Obesity, class 1: Secondary | ICD-10-CM | POA: Diagnosis not present

## 2024-02-06 DIAGNOSIS — Z6831 Body mass index (BMI) 31.0-31.9, adult: Secondary | ICD-10-CM

## 2024-02-06 DIAGNOSIS — G35D Multiple sclerosis, unspecified: Secondary | ICD-10-CM

## 2024-02-06 DIAGNOSIS — E6609 Other obesity due to excess calories: Secondary | ICD-10-CM

## 2024-02-06 DIAGNOSIS — F319 Bipolar disorder, unspecified: Secondary | ICD-10-CM

## 2024-02-06 DIAGNOSIS — Z8639 Personal history of other endocrine, nutritional and metabolic disease: Secondary | ICD-10-CM

## 2024-02-06 DIAGNOSIS — Z124 Encounter for screening for malignant neoplasm of cervix: Secondary | ICD-10-CM

## 2024-02-06 DIAGNOSIS — Z7289 Other problems related to lifestyle: Secondary | ICD-10-CM

## 2024-02-06 LAB — COMPREHENSIVE METABOLIC PANEL WITH GFR
ALT: 15 U/L (ref 0–35)
AST: 13 U/L (ref 0–37)
Albumin: 4.5 g/dL (ref 3.5–5.2)
Alkaline Phosphatase: 115 U/L (ref 39–117)
BUN: 10 mg/dL (ref 6–23)
CO2: 29 meq/L (ref 19–32)
Calcium: 10.7 mg/dL — ABNORMAL HIGH (ref 8.4–10.5)
Chloride: 105 meq/L (ref 96–112)
Creatinine, Ser: 0.97 mg/dL (ref 0.40–1.20)
GFR: 68.66 mL/min (ref >60.00)
Glucose, Bld: 89 mg/dL (ref 70–99)
Potassium: 4.4 meq/L (ref 3.5–5.1)
Sodium: 141 meq/L (ref 135–145)
Total Bilirubin: 0.4 mg/dL (ref 0.2–1.2)
Total Protein: 7.2 g/dL (ref 6.0–8.3)

## 2024-02-06 LAB — VITAMIN D 25 HYDROXY (VIT D DEFICIENCY, FRACTURES): VITD: 22.53 ng/mL — ABNORMAL LOW (ref 30.00–100.00)

## 2024-02-06 MED ORDER — VITAMIN D (ERGOCALCIFEROL) 1.25 MG (50000 UNIT) PO CAPS: 50000.0000 [IU] | ORAL_CAPSULE | ORAL | 0 refills | Status: AC

## 2024-02-06 NOTE — Patient Instructions (Addendum)
 Stop by the lab prior to leaving today. I will notify you of your results once received.   Follow up in 6 months for physical and fasting labs.   It was a pleasure to see you today!

## 2024-02-06 NOTE — Progress Notes (Signed)
 Established Patient Office Visit  Subjective   Patient ID: [name], female    DOB: 27-Apr-1975  Age: 48 y.o. MRN: [id number]  Chief Complaint  Patient presents with   chronic care management    Patient here today for follow up on chronic conditions     HPI  [name] is a 48 year old female with past medical history of HTN, Hyperthyroidism, PE, MS, bipolar 1 disorder, obesity and every day vaping presents today for chronic care management.   Discussed the use of AI scribe software for clinical note transcription with the patient, who gave verbal consent to proceed.  History of Present Illness [name] is a 49 year old female with multiple sclerosis and pulmonary embolism who presents for follow-up and management of her chronic conditions.  She has a history of multiple sclerosis and is under the care of neurology, receiving Ocrevus  injections biannually.  She has a history of pulmonary embolism and is on a daily regimen of baby aspirin. No issues with abnormal bleeding or clotting are reported.  She has a past history of hyperthyroidism, but recent thyroid  function tests have been normal, and she is not on any medication for this condition.  She experienced high blood pressure during her first pregnancy but has not had any issues since and is not on antihypertensive medication.  She is experiencing a skin issue under her left breast, initially described as a boil, which was painful but has improved with the use of a heating pad and ketoconazole  cream. The area now itches, and there is skin peeling around it. No current pus or pain is present.  She is currently on psychiatric medications including Adderall 5 mg once daily, Xanax  1 mg as needed, Prozac  60 mg once daily, Lamictal  200 mg once daily, and Risperidone  4 mg once daily.  She continues to vape daily. She has not had a Pap smear recently due to missed appointments but plans to reschedule. She has not been  consistent with over-the-counter vitamin D  supplements, although she completed a prescribed course.    Patient Active Problem List   Diagnosis Date Noted   Encounter for screening and preventative care 07/17/2023   Class 1 obesity due to excess calories with body mass index (BMI) of 31.0 to 31.9 in adult 07/17/2023   Peri-menopause 06/05/2023   Pruritic intertrigo 06/05/2023   Current every day vaping 06/05/2023   Pulmonary embolus (HCC) 10/07/2021   Gait disturbance 05/23/2021   Multiple sclerosis 04/20/2021   History of hyperthyroidism 02/14/2006   HYPERTENSION 02/14/2006   Bipolar 1 disorder (HCC) 01/11/2006   Past Medical History:  Diagnosis Date   Abnormal ultrasound of parathyroid gland    Anxiety    Bipolar 1 disorder (HCC)    Clotting disorder    Deficiency anemia 11/14/2021   Depression    Folate deficiency 10/07/2021   Hepatitis C    High risk medication use 05/23/2021   Hypercalcemia 04/27/2022   Numbness 05/23/2021   Thyroid  nodule greater than or equal to 1 cm in diameter incidentally noted on imaging study    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        Vitamin B12 deficiency 10/07/2021   Vitamin D  deficiency 10/07/2021   Past Surgical History:  Procedure Laterality Date   CESAREAN SECTION  x 2   Allergies  Allergen Reactions   Rituximab  Other (See Comments)    Patient had hypersensitivity reaction  during first administration. Please see progress notes dated 10/24/2021.         02/06/2024    8:44 AM 07/17/2023    8:38 AM 06/05/2023    9:25 AM  Depression screen PHQ 2/9  Decreased Interest 0 0 0  Down, Depressed, Hopeless 0 0 0  PHQ - 2 Score 0 0 0  Altered sleeping 0 0 0  Tired, decreased energy 0 1 1  Change in appetite 0 0 0  Feeling bad or failure about yourself  0 0 1  Trouble concentrating 0 0 0  Moving slowly or fidgety/restless 0 0 0  Suicidal thoughts 0 0 0  PHQ-9 Score 0 1 2   Difficult doing work/chores Not difficult at all Not difficult at all Not difficult at all       02/06/2024    8:44 AM 07/17/2023    8:38 AM 06/05/2023    9:26 AM  GAD 7 : Generalized Anxiety Score  Nervous, Anxious, on Edge 0 1 1  Control/stop worrying 0 1 1  Worry too much - different things 0 1 1  Trouble relaxing 0 0 0  Restless 0 0 0  Easily annoyed or irritable 0 0 0  Afraid - awful might happen 0 0 0  Total GAD 7 Score 0 3 3  Anxiety Difficulty Not difficult at all Not difficult at all Not difficult at all      Review of Systems  Constitutional:  Negative for chills and fever.  Respiratory:  Negative for shortness of breath.   Cardiovascular:  Negative for chest pain.  Gastrointestinal:  Negative for abdominal pain, constipation, diarrhea, heartburn, nausea and vomiting.  Genitourinary:  Negative for dysuria, frequency and urgency.  Skin:  Positive for itching.  Neurological:  Negative for dizziness and headaches.  Endo/Heme/Allergies:  Negative for polydipsia.  Psychiatric/Behavioral:  Negative for depression and suicidal ideas. The patient is not nervous/anxious.       Objective:     BP 120/82   Pulse 87   Temp 97.7 F (36.5 C) (Oral)   Ht 5' 5.5 (1.664 m)   Wt 206 lb (93.4 kg)   SpO2 97%   BMI 33.76 kg/m  BP Readings from Last 3 Encounters:  02/06/24 120/82  11/26/23 116/72  07/17/23 110/82   Wt Readings from Last 3 Encounters:  02/06/24 206 lb (93.4 kg)  11/26/23 203 lb (92.1 kg)  07/17/23 192 lb (87.1 kg)      Physical Exam Vitals and nursing note reviewed.  Constitutional:      Appearance: Normal appearance.  Cardiovascular:     Rate and Rhythm: Normal rate and regular rhythm.     Pulses: Normal pulses.     Heart sounds: Normal heart sounds.  Pulmonary:     Effort: Pulmonary effort is normal.     Breath sounds: Normal breath sounds.  Chest:       Comments: Healing skin lesion present on exam.  Neurological:     Mental Status: She  is alert and oriented to person, place, and time.  Psychiatric:        Mood and Affect: Mood normal.        Behavior: Behavior normal.        Thought Content: Thought content normal.        Judgment: Judgment normal.      No results found for any visits on 02/06/24.     The 10-year ASCVD risk score (Arnett DK, et al., 2019) is: 2.3%  Assessment & Plan:  Class 1 obesity due to excess calories with body mass index (BMI) of 31.0 to 31.9 in adult, unspecified whether serious comorbidity present Assessment & Plan: Discussed the importance of healthy diet and exercise to affect sustainable weight loss.     Vitamin D  deficiency -     VITAMIN D  25 Hydroxy (Vit-D Deficiency, Fractures)  Screening for cervical cancer -     Ambulatory referral to Gynecology  HYPERTENSION Assessment & Plan: Controlled.  BP at goal today.  Continue off meds.  CMP pending.  Orders: -     Comprehensive metabolic panel with GFR  Other pulmonary embolism without acute cor pulmonale, unspecified chronicity (HCC)  Multiple sclerosis  Current every day vaping Assessment & Plan: Counseled patient on the dangers of vaping, advised patient to stop and reviewed strategies to maximize success.   Bipolar 1 disorder (HCC) Assessment & Plan: Controlled.  Followed by psychiatry.   Continue medications per psychiatry.   History of hyperthyroidism Assessment & Plan: Controlled  Reviewed TSH from April.      Assessment and Plan Assessment & Plan Pruritic intertrigo under left breast Healed with peeling skin, itching persists. - Apply Vaseline to the affected area. - Avoid wearing a bra at home to keep the area dry. - Return for evaluation if pus or other symptoms develop.  Pulmonary embolism Managed with daily aspirin. Continues vaping despite risks. - Continue aspirin 81 mg daily. - Advise to quit vaping.  Multiple sclerosis Managed with Ocrevus  injections. Following with neurology. -  Continue Ocrevus  injections twice a year. - Continue follow-up with neurology.  Vitamin D  deficiency - Recheck vitamin D  levels.   Return in about 6 months (around 08/06/2024) for physical and fasting labs. SABRA Carrol Aurora, NP

## 2024-02-06 NOTE — Assessment & Plan Note (Signed)
 Discussed the importance of healthy diet and exercise to affect sustainable weight loss.

## 2024-02-06 NOTE — Assessment & Plan Note (Signed)
 Controlled.  Followed by psychiatry.   Continue medications per psychiatry.

## 2024-02-06 NOTE — Assessment & Plan Note (Signed)
 Controlled  Reviewed TSH from April.

## 2024-02-06 NOTE — Assessment & Plan Note (Signed)
 Controlled.  BP at goal today.  Continue off meds.  CMP pending.

## 2024-02-06 NOTE — Assessment & Plan Note (Signed)
 Counseled patient on the dangers of vaping, advised patient to stop and reviewed strategies to maximize success.

## 2024-02-18 ENCOUNTER — Other Ambulatory Visit: Payer: Self-pay | Admitting: Physician Assistant

## 2024-03-11 ENCOUNTER — Telehealth: Payer: Self-pay | Admitting: Physician Assistant

## 2024-03-11 NOTE — Telephone Encounter (Signed)
 Pt states the adderall is not keeping her awake and she wants to know if she can take 2 and get a script with the updated mg.

## 2024-03-11 NOTE — Telephone Encounter (Signed)
 Pt last filled Adderall 5 mg on 11/13. She reports it doesn't give her energy like it did initially, and she is still sleepy during the day. She is asking if she can double the current Rx and get a Rx for an increased dose when needed.  [facility].

## 2024-03-11 NOTE — Telephone Encounter (Signed)
 Have her increase to 2 po q am. Cancel the one Adderall 5 mg Rx she has available at the pharmacy.  Pend Adderall 10 mg, #30, 1 q am, start date 03/18/2024, since she'll run out early, not 12/12.   (please check my math)  Remind her not to take the Xanax  within 4 hours of the Adderall b/c neither med will work as well.  This combo isn't given commonly but in her case, I think the 2 meds are necessary.  Thanks.

## 2024-03-21 ENCOUNTER — Telehealth: Payer: Self-pay | Admitting: Physician Assistant

## 2024-03-21 ENCOUNTER — Other Ambulatory Visit: Payer: Self-pay

## 2024-03-21 DIAGNOSIS — F900 Attention-deficit hyperactivity disorder, predominantly inattentive type: Secondary | ICD-10-CM

## 2024-03-21 MED ORDER — AMPHETAMINE-DEXTROAMPHETAMINE 10 MG PO TABS: 10.0000 mg | ORAL_TABLET | Freq: Every day | ORAL | 0 refills | Status: DC

## 2024-03-21 NOTE — Telephone Encounter (Signed)
 Rx sent.

## 2024-03-21 NOTE — Telephone Encounter (Signed)
 Patient is calling in to see if we can send in the Adderall 10mg  for her. See previous message.

## 2024-03-21 NOTE — Telephone Encounter (Signed)
 Pt called requesting Rx for Adderall 5 mg 2/d. Out sooner due to increased  dose to 2/d. [facility] [address]

## 2024-03-21 NOTE — Telephone Encounter (Signed)
 Pended

## 2024-03-23 ENCOUNTER — Other Ambulatory Visit: Payer: Self-pay | Admitting: Neurology

## 2024-03-24 ENCOUNTER — Ambulatory Visit: Admitting: Family Medicine

## 2024-04-14 ENCOUNTER — Encounter: Payer: Self-pay | Admitting: Physician Assistant

## 2024-04-14 ENCOUNTER — Ambulatory Visit: Admitting: Physician Assistant

## 2024-04-14 DIAGNOSIS — F411 Generalized anxiety disorder: Secondary | ICD-10-CM

## 2024-04-14 DIAGNOSIS — G35D Multiple sclerosis, unspecified: Secondary | ICD-10-CM | POA: Diagnosis not present

## 2024-04-14 DIAGNOSIS — F900 Attention-deficit hyperactivity disorder, predominantly inattentive type: Secondary | ICD-10-CM | POA: Diagnosis not present

## 2024-04-14 DIAGNOSIS — F319 Bipolar disorder, unspecified: Secondary | ICD-10-CM

## 2024-04-14 MED ORDER — AMPHETAMINE-DEXTROAMPHETAMINE 10 MG PO TABS: 10.0000 mg | ORAL_TABLET | Freq: Every day | ORAL | 0 refills | Status: AC

## 2024-04-14 MED ORDER — LAMOTRIGINE 200 MG PO TABS: 200.0000 mg | ORAL_TABLET | Freq: Two times a day (BID) | ORAL | 11 refills | Status: AC

## 2024-04-14 NOTE — Progress Notes (Signed)
 "     [facility]  Patient ID: [name],  MRN: [id number]  PCP: Vincente Shivers, NP  Date of Evaluation: 04/14/2024 Time spent:25 minutes  Chief Complaint:  Chief Complaint   ADHD; Anxiety; Depression; Follow-up    Virtual Visit via Telehealth  I connected with patient by telephone, with their informed consent, and verified patient privacy and that I am speaking with the correct person using two identifiers.  I am private, in my office and the patient is at home.  I discussed the limitations, risks, security and privacy concerns of performing an evaluation and management service by telephone and the availability of in person appointments. I also discussed with the patient that there may be a patient responsible charge related to this service. The patient expressed understanding and agreed to proceed.   I discussed the assessment and treatment plan with the patient. The patient was provided an opportunity to ask questions and all were answered. The patient agreed with the plan and demonstrated an understanding of the instructions.   The patient was advised to call back or seek an in-person evaluation if the symptoms worsen or if the condition fails to improve as anticipated.  I provided 25 minutes of non-face-to-face time during this encounter.  HISTORY/CURRENT STATUS: For routine follow-up.  We increased the Adderall since LOV.  It's working better now.  She has more energy and motivation and is more able to focus.  She is not working right now so it is hard to say exactly how helpful it is but she can tell it is more beneficial than before.  She is considering starting work for [employer].  Just so she can have some money coming in.  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  Her husband is doing better so that is a huge relief.  No extreme sadness, tearfulness, or feelings of hopelessness.  Sleeps well most of the time.  She takes the Xanax  at night for sleep only.  Never  during the day.  If she does not take it she has a hard time falling asleep and staying asleep.  ADLs and personal hygiene are normal.   Appetite has not changed.  No mania, delirium, AH/VH.  No SI/HI.  Individual Medical History/ Review of Systems: Changes? :No      Past medications for mental health diagnoses include: Lithium, Wellbutrin was not effective for depression or decreased desire for nicotine , Risperdal , Lamictal , Ativan , Chantix caused paranoia and increased anger, BuSpar  made her dizzy  Allergies: Rituximab   Current Medications:  Current Outpatient Medications:    ALPRAZolam  (XANAX ) 1 MG tablet, Take 1 tablet (1 mg total) by mouth at bedtime as needed for anxiety. With occasional extra 1/2-1 during afternoon prn.  Must last 30 days., Disp: 45 tablet, Rfl: 5   [START ON 05/20/2024] amphetamine -dextroamphetamine  (ADDERALL) 10 MG tablet, Take 1 tablet (10 mg total) by mouth daily with breakfast., Disp: 30 tablet, Rfl: 0   [START ON 06/16/2024] amphetamine -dextroamphetamine  (ADDERALL) 10 MG tablet, Take 1 tablet (10 mg total) by mouth daily with breakfast., Disp: 30 tablet, Rfl: 0   aspirin EC 81 MG tablet, Take 81 mg by mouth daily. Swallow whole., Disp: , Rfl:    baclofen  (LIORESAL ) 10 MG tablet, TAKE 0.5-1 TABLETS (5-10 MG TOTAL) BY MOUTH 3 (THREE) TIMES DAILY., Disp: 270 tablet, Rfl: 3   FLUoxetine  (PROZAC ) 20 MG capsule, Take 3 capsules (60 mg total) by mouth daily., Disp: 90 capsule, Rfl: 11   ketoconazole  (  NIZORAL ) 2 % cream, APPLY TO AFFECTED AREA TWICE A DAY, Disp: 60 g, Rfl: 0   ocrelizumab  (OCREVUS ) 300 MG/10ML injection, Inject into the vein once. Initial dose: 300mg  IV on day 1 and repeat day 15. Maintenance: 600mg  IV q 6months, Disp: , Rfl:    risperidone  (RISPERDAL ) 4 MG tablet, TAKE 1 TABLET BY MOUTH EVERY DAY, Disp: 90 tablet, Rfl: 1   Vitamin D , Ergocalciferol , (DRISDOL ) 1.25 MG (50000 UNIT) CAPS capsule, Take 1 capsule (50,000 Units total) by mouth every 7 (seven) days.,  Disp: 12 capsule, Rfl: 0   [START ON 04/20/2024] amphetamine -dextroamphetamine  (ADDERALL) 10 MG tablet, Take 1 tablet (10 mg total) by mouth daily with breakfast., Disp: 30 tablet, Rfl: 0   lamoTRIgine  (LAMICTAL ) 200 MG tablet, Take 1 tablet (200 mg total) by mouth 2 (two) times daily., Disp: 60 tablet, Rfl: 11 Medication Side Effects: none  Family Medical/ Social History: Changes?  See HPI.  MENTAL HEALTH EXAM:  There were no vitals taken for this visit.There is no height or weight on file to calculate BMI.  General Appearance: Unable to assess  Eye Contact:  Unable to assess  Speech:  Clear and Coherent and Normal Rate  Volume:  Normal  Mood:  Euthymic  Affect:  Unable to assess  Thought Process:  Goal Directed and Descriptions of Associations: Circumstantial  Orientation:  Full (Time, Place, and Person)  Thought Content: Logical   Suicidal Thoughts:  No  Homicidal Thoughts:  No  Memory:  WNL  Judgement:  Good  Insight:  Good  Psychomotor Activity:  Unable to assess  Concentration:  Concentration: Good and Attention Span: Good  Recall:  Good  Fund of Knowledge: Good  Language: Good  Assets:  Communication Skills Desire for Improvement Financial Resources/Insurance Housing Leisure Time Resilience Social Support Transportation  ADL's:  Intact  Cognition: WNL  Prognosis:  Good   DIAGNOSES:    ICD-10-CM   1. Bipolar I disorder (HCC)  F31.9     2. ADHD (attention deficit hyperactivity disorder), inattentive type  F90.0 amphetamine -dextroamphetamine  (ADDERALL) 10 MG tablet    3. Generalized anxiety disorder  F41.1     4. Multiple sclerosis  G35.D       Receiving Psychotherapy: No   RECOMMENDATIONS:  PDMP was reviewed.  Last Adderall filled 03/21/2024.  Xanax  filled 03/21/2024. I provided approximately 25 minutes of non-face-to-face time during this encounter, including time spent before and after the visit in records review, medical decision making, counseling  pertinent to today's visit, and charting.   She is doing better with the increased dose of Adderall.  Will continue that.  Okay to increase over the phone if needed before our next visit.  Sleep hygiene briefly discussed.  Continue Xanax  1 mg, 1 p.o. 3 times daily as needed. Take as sparingly as possible. Continue Adderall 10mg , 1 q am.  Continue Prozac  60 mg daily. Continue Lamictal   200 mg, 1 p.o. twice daily. Continue Risperdal  4 mg nightly. Recommend counseling. Return in 3 months.  Verneita Cooks, PA-C  "

## 2024-04-18 ENCOUNTER — Telehealth: Payer: Self-pay | Admitting: Physician Assistant

## 2024-04-18 NOTE — Telephone Encounter (Signed)
 Analee lvm that she wanted to talk to a nurse about a medication. Her phone number is [phone]

## 2024-04-18 NOTE — Telephone Encounter (Signed)
 Pt reporting the 10 mg Adderall is causing her increased anxiety, wants to go back to 5 mg. Will split tablet until next RF.

## 2024-05-20 ENCOUNTER — Telehealth: Payer: Self-pay | Admitting: *Deleted

## 2024-05-20 DIAGNOSIS — G35C1 Active secondary progressive multiple sclerosis: Secondary | ICD-10-CM | POA: Insufficient documentation

## 2024-05-20 NOTE — Telephone Encounter (Signed)
 [employer] needs updated [place] ICD 10 code for pt's Ocrevus.

## 2024-05-21 ENCOUNTER — Telehealth: Payer: Self-pay | Admitting: Physician Assistant

## 2024-05-21 NOTE — Telephone Encounter (Signed)
 Pt was started on Adderall 5 mg and then said it wasn't strong enough and wanted 10, reported 10 was too strong and was splitting in half to go back to 5 mg. She is almost thru with Rx and is asking to stop it. She said she is hyperfocused - is so focused on grocery shopping that she is walking in front of other people without realizing it. Can she just stop it?

## 2024-05-21 NOTE — Telephone Encounter (Signed)
 Pt called wants to d/c adderall. Does she need to wean of adderall 5 mg or just stop? RTC [phone]

## 2024-05-21 NOTE — Telephone Encounter (Signed)
Pt notified of recommendation 

## 2024-05-21 NOTE — Telephone Encounter (Signed)
 Per Dr Vear, G35.C1 . Dx code placed on sheet for [employer].

## 2024-05-21 NOTE — Telephone Encounter (Signed)
 Yes, just stop it.

## 2024-05-22 ENCOUNTER — Other Ambulatory Visit: Payer: Self-pay | Admitting: Neurology

## 2024-05-22 ENCOUNTER — Telehealth: Payer: Self-pay | Admitting: Neurology

## 2024-05-22 DIAGNOSIS — G35C1 Active secondary progressive multiple sclerosis: Secondary | ICD-10-CM

## 2024-05-22 DIAGNOSIS — R269 Unspecified abnormalities of gait and mobility: Secondary | ICD-10-CM

## 2024-05-22 NOTE — Telephone Encounter (Signed)
 Called and relayed the following: We do not do this formal test. Some occupational therapist will do this.   Pt would like to know if dr. Vear would like to have this ordered at [facility] if possible. Routing to DR [employer]

## 2024-05-22 NOTE — Telephone Encounter (Signed)
 Pt called  stating that lawyer is trying to get Pt Short term disability  and Pt needs Physical Residual Functional Capacity test . Pt wanted to know is this something [employer] [facility] can do

## 2024-06-11 ENCOUNTER — Ambulatory Visit: Admitting: Family Medicine

## 2024-07-14 ENCOUNTER — Telehealth: Admitting: Physician Assistant

## 2024-07-21 ENCOUNTER — Encounter: Admitting: General Practice

## 2024-08-26 ENCOUNTER — Ambulatory Visit: Admitting: Family Medicine
